# Patient Record
Sex: Female | Born: 1970 | Race: White | Hispanic: No | Marital: Single | Smoking: Current every day smoker
Health system: Southern US, Community
[De-identification: ages and names within clinical notes are randomized; demographics above are authoritative.]

## PROBLEM LIST (undated history)

## (undated) DIAGNOSIS — Z8719 Personal history of other diseases of the digestive system: Secondary | ICD-10-CM

## (undated) DIAGNOSIS — F1010 Alcohol abuse, uncomplicated: Secondary | ICD-10-CM

## (undated) DIAGNOSIS — I456 Pre-excitation syndrome: Secondary | ICD-10-CM

## (undated) DIAGNOSIS — I739 Peripheral vascular disease, unspecified: Secondary | ICD-10-CM

## (undated) DIAGNOSIS — K56609 Unspecified intestinal obstruction, unspecified as to partial versus complete obstruction: Secondary | ICD-10-CM

## (undated) DIAGNOSIS — F419 Anxiety disorder, unspecified: Secondary | ICD-10-CM

## (undated) DIAGNOSIS — M1990 Unspecified osteoarthritis, unspecified site: Secondary | ICD-10-CM

## (undated) DIAGNOSIS — K219 Gastro-esophageal reflux disease without esophagitis: Secondary | ICD-10-CM

## (undated) DIAGNOSIS — F329 Major depressive disorder, single episode, unspecified: Secondary | ICD-10-CM

## (undated) DIAGNOSIS — F32A Depression, unspecified: Secondary | ICD-10-CM

## (undated) DIAGNOSIS — F319 Bipolar disorder, unspecified: Secondary | ICD-10-CM

## (undated) DIAGNOSIS — R0600 Dyspnea, unspecified: Secondary | ICD-10-CM

## (undated) DIAGNOSIS — N189 Chronic kidney disease, unspecified: Secondary | ICD-10-CM

## (undated) HISTORY — DX: Peripheral vascular disease, unspecified: I73.9

## (undated) HISTORY — DX: Pre-excitation syndrome: I45.6

## (undated) HISTORY — DX: Major depressive disorder, single episode, unspecified: F32.9

## (undated) HISTORY — DX: Unspecified intestinal obstruction, unspecified as to partial versus complete obstruction: K56.609

## (undated) HISTORY — DX: Bipolar disorder, unspecified: F31.9

## (undated) HISTORY — DX: Anxiety disorder, unspecified: F41.9

## (undated) HISTORY — DX: Depression, unspecified: F32.A

## (undated) HISTORY — PX: TUBAL LIGATION: SHX77

## (undated) HISTORY — DX: Unspecified osteoarthritis, unspecified site: M19.90

## (undated) HISTORY — DX: Alcohol abuse, uncomplicated: F10.10

---

## 1997-09-16 ENCOUNTER — Other Ambulatory Visit: Admission: RE | Admit: 1997-09-16 | Discharge: 1997-09-16 | Payer: Self-pay | Admitting: Obstetrics

## 1997-11-26 ENCOUNTER — Encounter: Admission: RE | Admit: 1997-11-26 | Discharge: 1997-11-26 | Payer: Self-pay | Admitting: Obstetrics & Gynecology

## 1997-12-06 ENCOUNTER — Ambulatory Visit (HOSPITAL_COMMUNITY): Admission: RE | Admit: 1997-12-06 | Discharge: 1997-12-06 | Payer: Self-pay | Admitting: Obstetrics & Gynecology

## 1997-12-07 ENCOUNTER — Encounter: Admission: RE | Admit: 1997-12-07 | Discharge: 1997-12-07 | Payer: Self-pay | Admitting: Obstetrics & Gynecology

## 1998-01-12 ENCOUNTER — Ambulatory Visit (HOSPITAL_COMMUNITY): Admission: RE | Admit: 1998-01-12 | Discharge: 1998-01-12 | Payer: Self-pay | Admitting: *Deleted

## 1998-03-10 ENCOUNTER — Encounter: Admission: RE | Admit: 1998-03-10 | Discharge: 1998-06-08 | Payer: Self-pay | Admitting: Obstetrics

## 1998-03-22 ENCOUNTER — Ambulatory Visit (HOSPITAL_COMMUNITY): Admission: RE | Admit: 1998-03-22 | Discharge: 1998-03-22 | Payer: Self-pay | Admitting: *Deleted

## 1998-06-09 ENCOUNTER — Inpatient Hospital Stay (HOSPITAL_COMMUNITY): Admission: AD | Admit: 1998-06-09 | Discharge: 1998-06-09 | Payer: Self-pay | Admitting: Obstetrics & Gynecology

## 1998-07-06 ENCOUNTER — Encounter (HOSPITAL_COMMUNITY): Admission: RE | Admit: 1998-07-06 | Discharge: 1998-08-16 | Payer: Self-pay | Admitting: Obstetrics & Gynecology

## 1998-07-06 ENCOUNTER — Encounter: Admission: RE | Admit: 1998-07-06 | Discharge: 1998-07-06 | Payer: Self-pay | Admitting: Obstetrics & Gynecology

## 1998-07-13 ENCOUNTER — Encounter: Admission: RE | Admit: 1998-07-13 | Discharge: 1998-07-13 | Payer: Self-pay | Admitting: Obstetrics & Gynecology

## 1998-07-20 ENCOUNTER — Inpatient Hospital Stay (HOSPITAL_COMMUNITY): Admission: AD | Admit: 1998-07-20 | Discharge: 1998-07-20 | Payer: Self-pay | Admitting: *Deleted

## 1998-07-23 ENCOUNTER — Inpatient Hospital Stay (HOSPITAL_COMMUNITY): Admission: AD | Admit: 1998-07-23 | Discharge: 1998-07-23 | Payer: Self-pay | Admitting: Obstetrics & Gynecology

## 1998-07-30 ENCOUNTER — Inpatient Hospital Stay (HOSPITAL_COMMUNITY): Admission: AD | Admit: 1998-07-30 | Discharge: 1998-07-30 | Payer: Self-pay | Admitting: Obstetrics & Gynecology

## 1998-08-12 ENCOUNTER — Inpatient Hospital Stay (HOSPITAL_COMMUNITY): Admission: AD | Admit: 1998-08-12 | Discharge: 1998-08-12 | Payer: Self-pay | Admitting: *Deleted

## 1998-08-15 ENCOUNTER — Inpatient Hospital Stay (HOSPITAL_COMMUNITY): Admission: AD | Admit: 1998-08-15 | Discharge: 1998-08-17 | Payer: Self-pay | Admitting: Obstetrics & Gynecology

## 1999-06-07 ENCOUNTER — Encounter: Payer: Self-pay | Admitting: Emergency Medicine

## 1999-06-07 ENCOUNTER — Emergency Department (HOSPITAL_COMMUNITY): Admission: EM | Admit: 1999-06-07 | Discharge: 1999-06-07 | Payer: Self-pay | Admitting: Emergency Medicine

## 1999-09-06 ENCOUNTER — Emergency Department (HOSPITAL_COMMUNITY): Admission: EM | Admit: 1999-09-06 | Discharge: 1999-09-06 | Payer: Self-pay | Admitting: Emergency Medicine

## 1999-09-06 ENCOUNTER — Encounter: Payer: Self-pay | Admitting: Emergency Medicine

## 1999-09-27 ENCOUNTER — Encounter: Payer: Self-pay | Admitting: Cardiology

## 1999-09-27 ENCOUNTER — Ambulatory Visit (HOSPITAL_COMMUNITY): Admission: RE | Admit: 1999-09-27 | Discharge: 1999-09-27 | Payer: Self-pay | Admitting: Cardiology

## 1999-10-04 ENCOUNTER — Other Ambulatory Visit: Admission: RE | Admit: 1999-10-04 | Discharge: 1999-10-04 | Payer: Self-pay | Admitting: Family Medicine

## 1999-10-17 ENCOUNTER — Other Ambulatory Visit: Admission: RE | Admit: 1999-10-17 | Discharge: 1999-10-17 | Payer: Self-pay | Admitting: Obstetrics & Gynecology

## 1999-10-17 ENCOUNTER — Encounter: Admission: RE | Admit: 1999-10-17 | Discharge: 1999-10-17 | Payer: Self-pay | Admitting: Obstetrics & Gynecology

## 1999-10-31 ENCOUNTER — Encounter: Admission: RE | Admit: 1999-10-31 | Discharge: 1999-10-31 | Payer: Self-pay | Admitting: Obstetrics & Gynecology

## 2001-06-04 ENCOUNTER — Inpatient Hospital Stay (HOSPITAL_COMMUNITY): Admission: EM | Admit: 2001-06-04 | Discharge: 2001-06-06 | Payer: Self-pay | Admitting: Emergency Medicine

## 2001-06-04 ENCOUNTER — Encounter: Payer: Self-pay | Admitting: Emergency Medicine

## 2001-06-05 ENCOUNTER — Encounter: Payer: Self-pay | Admitting: Cardiology

## 2008-03-19 ENCOUNTER — Emergency Department (HOSPITAL_COMMUNITY): Admission: EM | Admit: 2008-03-19 | Discharge: 2008-03-19 | Payer: Self-pay | Admitting: Emergency Medicine

## 2009-01-01 ENCOUNTER — Emergency Department (HOSPITAL_COMMUNITY): Admission: EM | Admit: 2009-01-01 | Discharge: 2009-01-01 | Payer: Self-pay | Admitting: Emergency Medicine

## 2009-01-02 ENCOUNTER — Inpatient Hospital Stay (HOSPITAL_COMMUNITY): Admission: EM | Admit: 2009-01-02 | Discharge: 2009-01-04 | Payer: Self-pay | Admitting: Emergency Medicine

## 2009-01-04 ENCOUNTER — Encounter (INDEPENDENT_AMBULATORY_CARE_PROVIDER_SITE_OTHER): Payer: Self-pay | Admitting: Internal Medicine

## 2009-01-04 ENCOUNTER — Ambulatory Visit: Payer: Self-pay | Admitting: Vascular Surgery

## 2009-04-01 ENCOUNTER — Emergency Department (HOSPITAL_COMMUNITY): Admission: EM | Admit: 2009-04-01 | Discharge: 2009-04-01 | Payer: Self-pay | Admitting: Emergency Medicine

## 2010-01-29 ENCOUNTER — Emergency Department (HOSPITAL_COMMUNITY): Admission: EM | Admit: 2010-01-29 | Discharge: 2010-01-29 | Payer: Self-pay | Admitting: Family Medicine

## 2010-05-29 ENCOUNTER — Emergency Department (HOSPITAL_COMMUNITY)
Admission: EM | Admit: 2010-05-29 | Discharge: 2010-05-29 | Payer: Self-pay | Source: Home / Self Care | Admitting: Family Medicine

## 2010-05-29 ENCOUNTER — Emergency Department (HOSPITAL_COMMUNITY): Admission: EM | Admit: 2010-05-29 | Discharge: 2010-05-29 | Payer: Self-pay | Admitting: Emergency Medicine

## 2010-06-04 ENCOUNTER — Emergency Department (HOSPITAL_COMMUNITY): Admission: EM | Admit: 2010-06-04 | Discharge: 2010-06-04 | Payer: Self-pay | Admitting: Family Medicine

## 2010-07-30 DIAGNOSIS — K56609 Unspecified intestinal obstruction, unspecified as to partial versus complete obstruction: Secondary | ICD-10-CM

## 2010-07-30 HISTORY — DX: Unspecified intestinal obstruction, unspecified as to partial versus complete obstruction: K56.609

## 2010-10-11 LAB — POCT URINALYSIS DIPSTICK
Hgb urine dipstick: NEGATIVE
Nitrite: NEGATIVE
Protein, ur: NEGATIVE mg/dL
Specific Gravity, Urine: >=1.03 (ref 1.005–1.030)
Urobilinogen, UA: 0.2 mg/dL (ref 0.0–1.0)
pH: 5.5 (ref 5.0–8.0)

## 2010-10-15 LAB — POCT RAPID STREP A (OFFICE): Streptococcus, Group A Screen (Direct): NEGATIVE

## 2010-11-06 LAB — CULTURE, BLOOD (ROUTINE X 2)
Culture: NO GROWTH
Culture: NO GROWTH

## 2010-11-06 LAB — COMPREHENSIVE METABOLIC PANEL
ALT: 13 U/L (ref 0–35)
AST: 13 U/L (ref 0–37)
Albumin: 2.7 g/dL — ABNORMAL LOW (ref 3.5–5.2)
Alkaline Phosphatase: 70 U/L (ref 39–117)
Calcium: 8.3 mg/dL — ABNORMAL LOW (ref 8.4–10.5)
GFR calc Af Amer: >60 mL/min (ref >60)
Glucose, Bld: 108 mg/dL — ABNORMAL HIGH (ref 70–99)
Potassium: 3.8 mEq/L (ref 3.5–5.1)
Sodium: 138 mEq/L (ref 135–145)
Total Protein: 5.9 g/dL — ABNORMAL LOW (ref 6.0–8.3)

## 2010-11-06 LAB — CBC
HCT: 36.5 % (ref 36.0–46.0)
Hemoglobin: 10.9 g/dL — ABNORMAL LOW (ref 12.0–15.0)
MCV: 93.7 fL (ref 78.0–100.0)
Platelets: 137 10*3/uL — ABNORMAL LOW (ref 150–400)
Platelets: 161 10*3/uL (ref 150–400)
RBC: 3.37 MIL/uL — ABNORMAL LOW (ref 3.87–5.11)
RBC: 3.74 MIL/uL — ABNORMAL LOW (ref 3.87–5.11)
RBC: 3.97 MIL/uL (ref 3.87–5.11)
RDW: 13 % (ref 11.5–15.5)
WBC: 5.1 10*3/uL (ref 4.0–10.5)
WBC: 7.2 10*3/uL (ref 4.0–10.5)

## 2010-11-06 LAB — LIPID PANEL
HDL: 19 mg/dL — ABNORMAL LOW (ref >39)
Total CHOL/HDL Ratio: 7.7 RATIO
Triglycerides: 141 mg/dL (ref <150)

## 2010-11-06 LAB — POCT I-STAT, CHEM 8
BUN: 4 mg/dL — ABNORMAL LOW (ref 6–23)
Creatinine, Ser: 1 mg/dL (ref 0.4–1.2)
Glucose, Bld: 122 mg/dL — ABNORMAL HIGH (ref 70–99)
Potassium: 3.6 mEq/L (ref 3.5–5.1)
Sodium: 137 mEq/L (ref 135–145)
TCO2: 25 mmol/L (ref 0–100)

## 2010-11-06 LAB — CK TOTAL AND CKMB (NOT AT ARMC)
CK, MB: 0.8 ng/mL (ref 0.3–4.0)
Total CK: 32 U/L (ref 7–177)

## 2010-11-06 LAB — ANTI-NUCLEAR AB-TITER (ANA TITER): ANA Titer 1: NEGATIVE

## 2010-11-06 LAB — URINALYSIS, ROUTINE W REFLEX MICROSCOPIC
Bilirubin Urine: NEGATIVE
Glucose, UA: NEGATIVE mg/dL
Hgb urine dipstick: NEGATIVE
Ketones, ur: NEGATIVE mg/dL
Protein, ur: NEGATIVE mg/dL
Urobilinogen, UA: 0.2 mg/dL (ref 0.0–1.0)

## 2010-11-06 LAB — URINE CULTURE: Special Requests: NEGATIVE

## 2010-11-06 LAB — HCG, SERUM, QUALITATIVE: Preg, Serum: NEGATIVE

## 2010-11-06 LAB — TROPONIN I
Troponin I: 0.01 ng/mL (ref 0.00–0.06)
Troponin I: 0.01 ng/mL (ref 0.00–0.06)
Troponin I: 0.02 ng/mL (ref 0.00–0.06)

## 2010-11-06 LAB — RAPID URINE DRUG SCREEN, HOSP PERFORMED
Amphetamines: NOT DETECTED
Barbiturates: NOT DETECTED
Benzodiazepines: NOT DETECTED
Cocaine: POSITIVE — AB
Opiates: NOT DETECTED

## 2010-11-06 LAB — DIFFERENTIAL
Eosinophils Relative: 0 % (ref 0–5)
Lymphocytes Relative: 18 % (ref 12–46)
Lymphs Abs: 1.3 10*3/uL (ref 0.7–4.0)
Monocytes Relative: 5 % (ref 3–12)

## 2010-11-06 LAB — HEPATIC FUNCTION PANEL
Bilirubin, Direct: 0.2 mg/dL (ref 0.0–0.3)
Indirect Bilirubin: 0.6 mg/dL (ref 0.3–0.9)
Total Protein: 6.8 g/dL (ref 6.0–8.3)

## 2010-11-06 LAB — BASIC METABOLIC PANEL
BUN: 7 mg/dL (ref 6–23)
Creatinine, Ser: 0.93 mg/dL (ref 0.4–1.2)
GFR calc non Af Amer: >60 mL/min (ref >60)

## 2010-11-06 LAB — URIC ACID: Uric Acid, Serum: 4.5 mg/dL (ref 2.4–7.0)

## 2010-11-06 LAB — BRAIN NATRIURETIC PEPTIDE: Pro B Natriuretic peptide (BNP): 41 pg/mL (ref 0.0–100.0)

## 2010-11-06 LAB — ANA: Anti Nuclear Antibody(ANA): POSITIVE — AB

## 2010-11-06 LAB — GLUCOSE, CAPILLARY

## 2010-12-12 NOTE — Discharge Summary (Signed)
NAMEZAYAH, [name] NO.:  [id number]   MEDICAL RECORD NO.:  [id number]          PATIENT TYPE:  INP   LOCATION:  3703                         FACILITY:  [facility]   PHYSICIAN:  Isidor Holts, M.D.  DATE OF BIRTH:  1970-12-07   DATE OF ADMISSION:  01/02/2009  DATE OF DISCHARGE:  01/04/2009                               DISCHARGE SUMMARY   PRIMARY CARE PHYSICIAN:  Unassigned.   DISCHARGE DIAGNOSES:  1. Pruritic dermatosis, bilateral lower extremities.  2. Left big toe abrasion and nailbed wound.  3. History of bipolar disorder.  4. History of Wolff-Parkinson-White syndrome.  5. Polysubstance abuse.  6. Smoking history.  7. Chronic alcoholism.  8. History of osteoarthritis, knees and ankles.  9. History of hematochezia.   DISCHARGE MEDICATIONS:  1. Depakote 1500 mg p.o. q.h.s.  2. Lexapro 10 mg p.o. daily.  3. Thiamine 100 mg p.o. daily.  4. Prednisone 40 mg p.o. daily for three days, from January 05, 2009.  5. Doxycycline 100 mg p.o. b.i.d. for seven days only.  6. Ativan 1 mg p.o. t.i.d. for two days, then 1 mg p.o. b.i.d. for two      days, then 1 mg p.o. daily times one day, then stop.  7. Hydrocortisone cream 1% apply topically to both lower legs b.i.d.      for five days, then stop.  8. Combivent Inhaler, 2 puffs p.r.n. q.i.d.   PROCEDURES:  1. X-rays both feet and ankles January 03, 2009.  This showed soft tissue      swelling, no acute bony abnormalities.  2. Chest x-ray done January 03, 2009.  This showed vascular congestion, no      frank edema, negative for infiltrates or effusions, heart and      mediastinal contours were normal.   CONSULTATIONS:  None.   ADMISSION HISTORY:  Please refer to H and P notes of January 02, 2009,  dictated by Dr. Midge Minium.  However, in brief, this is a 40-year-  old female, with known history of bipolar disorder, Wolff-Parkinson-  White syndrome, smoking history, chronic alcohol abuse, and cocaine use,  presenting  with bilateral lower extremity swelling and erythema which  reportedly had been present for five days prior.  The patient associated  this with the fact that she was commenced on psychotropic medications,  i.e., Haloperidol and Benztropine approximately two weeks earlier.  She  discontinued these subsequently, and following this, the rash and lower  extremity edema occurred.  She felt that this has worsened, and  therefore presented to the emergency department.  In addition, the  patient states that she had been noticing blood in the stool on and off  for the past three months, not associated with abdominal pain or frank  melena.  Last episode was three days prior to this presentation.  She  had no associated weight loss or loss of appetite.  The patient was  admitted for further evaluation, investigation, and management.   CLINICAL COURSE:  1. Pruritic dermatosis, bilateral lower extremities.  Etiology is      unclear, however, the patient  did have a papular rash localized to      both lower extremities below the knee.  She was managed initially      with parenteral steroids.  Topical steroids were applied and the      patient was transitioned to oral steroids on January 04, 2009.      Clinical resolution was dramatic.  By January 04, 2009, there was no      evidence of either erythema or rash.   1. Left first toe wound.  The patient as mentioned above, presented      with an erythematous rash of bilateral lower extremities associated      with mild edema, raising the suspicion of cellulitis.  She was      initially commenced on parenteral Doxycycline.  On physical      examination, she was noted to have a partially torn nail of the      left big toe leaving a nailbed wound, as well as an abrasion on the      dorsum of the same toe.  According to the patient, she had stubbed      this toe some time in the past few days.  These wounds however,      appeared partially healed and given dramatic  resolution of      erythema, cellulitis appears not to be substantiated. However, it      was felt prudent to continue antibiotic treatment for a further      seven days.  She has been transitioned to oral Doxycycline and      although left toe wound showed no evidence of overt infection, she      has been recommended to utilize a dry dressing, i.e., Band-Aid.   1. Bipolar disorder.  The patient remained clinically stable from this      view point, during the course of her hospitalization.   1. History of Wolff-Parkinson-White syndrome.  There were no problems      referable to this.  No arrhythmias were documented.   1. Smoking history.  The patient smokes at least two packets of      cigarettes per day.  She was counseled appropriately, and placed on      a Nicotine CQ patch, during the course of this hospitalization.   1. Alcohol abuse.  The patient drinks approximately half pint of vodka      per day.  She was monitored closely for evidence of alcohol      withdrawal syndrome during the course of this hospitalization and      no withdrawal phenomena were noted.  She was, however, managed with      a tapering course of Ativan, and placed on Thiamine supplements.   1. COPD.  The patient did have a mild expiratory wheeze.  This was      managed with a Combivent inhaler.  Chest x-ray revealed no acute      findings.  As of January 04, 2009, the patient had no respiratory      symptoms whatsoever.   1. History of cocaine use.  This was confirmed by urine drug screen.      The patient has been counseled appropriately.   1. History of hematochezia.  None was noted during the course of this      hospitalization.  The patient's initial hemoglobin was 12.1 and on      January 04, 2009 was considered to be stable at 10.9.  It  is felt that      the patient might benefit from colonoscopy on an outpatient basis,      and she has been so advised.  She has no primary M.D. at the      present time,  however, arrangements have been put in place to refer      the patient to [facility].  We shall defer referral to      gastroenterologist to primary M.D, at [facility].   DISPOSITION:  The patient as of January 04, 2009, was considered clinically  stable for discharge, was asymptomatic, and had no evidence whatsoever  of rash or erythema of the lower extremities.  She was therefore,  discharged accordingly.   DIET:  No restrictions.   ACTIVITY:  No restrictions.   WOUND CARE:  Band-Aid to left big toe wound.   FOLLOW-UP INSTRUCTIONS:  The patient is to follow up with her regular  psychiatrist, Dr. Lavada Mesi per prior scheduled appointment.  She has  also been referred to [facility] for subsequent followup, for her  medical issues.  Appointment has been arranged by clinical social worker  and Futures trader.   SPECIAL INSTRUCTIONS:  Primary M.D. at [facility] is recommended to  arrange gastroenterology referral, to evaluate history of rectal  bleeding.      Isidor Holts, M.D.  Electronically Signed     CO/MEDQ  D:  01/04/2009  T:  01/04/2009  Job:  [id number]   cc:   Lavada Mesi, M.D.  [facility]

## 2010-12-12 NOTE — H&P (Signed)
NAMEJENNIEFER, [name] NO.:  [id number]   MEDICAL RECORD NO.:  [id number]          PATIENT TYPE:  INP   LOCATION:  3703                         FACILITY:  [facility]   PHYSICIAN:  Eduard Clos, MDDATE OF BIRTH:  04-17-1971   DATE OF ADMISSION:  01/02/2009  DATE OF DISCHARGE:                              HISTORY & PHYSICAL   CHIEF COMPLAINT:  Lower Extremity edema, swelling and itching.   HISTORY OF PRESENT ILLNESS:  A 40 year old female with known history of  bipolar disorder who was recently started 2 weeks ago on allopurinol and  benztropine along with medication.  The protime Lexapro started  developing some increasing rash and swelling of her lower extremities  bilaterally for the last five days.  The patient has been recently to  the beach last week.  The patient has stopped taking her benztropine and  Haloperidol for the last five days.  Itching and erythema started in the  lower extremity.  Presently, she feels that the swelling has increased  and is developing over her knees, and she was also complaining of knee  pain and her ankles and upper and right lower extremity is more swollen.  The patient also is developing some shortness of breath, but not in  acute distress, particularly she is short of breath and she exerts.  The  patient has been admitted for further evaluation.  The patient denies  any fever or chills, nausea, vomiting, abdominal pain.  Denies any  dysuria, discharge, the patient has been noticing bloody stools off and  on.  The last one she noticed was three days ago.  She had been having  these symptoms for last 3 months.   PAST MEDICAL HISTORY:  1. WPW syndrome.  2. Bipolar disorder.   PAST SURGICAL HISTORY:  None.   MEDICATIONS ON ADMISSION:  The patient presently is on:  1. Lexapro 10 mg daily.  2. Lipitor 5000 mg p.o. at bedtime.   ALLERGIES:  No known drug abuse.   FAMILY HISTORY:  Significant for coronary artery disease.   Dad died at  age 59 from MI.   ALLERGIES:  NO KNOWN DRUG ALLERGIES.   SOCIAL HISTORY:  The patient smokes cigarettes, drink alcohol daily.  Denies any drug abuse.  The patient has been advised to quit smoking and  drinking alcohol.   REVIEW OF SYSTEMS:  As per history of present illness.  Along with it,  the patient as explained has been coming off some GI bleed.  Denies any  headache, loss of consciousness or any focal deficits.   PHYSICAL EXAMINATION:  GENERAL:  The patient examined at bedside not in  acute distress.  VITAL SIGNS:  Blood pressure is 96/64, pulse 80, temperature 98.1,  respirations 18, O2 sat 96% on room air.  HEENT:  There is no facial asymmetry.  PERL positive.  Tongue is  midline.  No swelling or any facial erythema.  No neck rigidity.  CHEST:  Bilateral air entry present.  No crepitation.  HEART:  S1, S2 heard.  ABDOMEN:  Soft, nontender.  Bowel sounds heard.  CNS: Alert and oriented to time, place and person.  Moves upper and  lower extremities.  There is macular papular rash extending up to the  thigh with positive swelling, 1+ edema in the right ankle and minimal  edema in the left ankle, nonpitting.  Pulses felt.   LABORATORY DATA:  CBC:  WBC 7.2, hemoglobin 12.7, hematocrit 36.5,  platelets 161.   ASSESSMENT:  1. Erythema with itching and lower extremity edema.  Will possibly be      secondary to allergic reaction with cellulitis.  2. Arthritis of both ankles and knees.  3. Chronic GI bleed, episodic, presently stable.  4. History of bipolar disorder.  No suicidal ideation.  5. Obesity.  6. History of WPW syndrome.  7. Exertional shortness of breath, could be from deconditioning with      COPD, bilateral THF.   PLAN:  1. Will admit the patient to telemetry.  2. Will get blood cultures, urine cultures.  Will also send antibody      tests for [facility] Spotted Fever.  3. Will place the patient on doxycycline 100 mg IV q. 12.  4. Will get  X-ray of both feet, ankle to rule out fracture.  Get ANA      and sed rate.  Get urine drug screen and urine pregnancy screen.  5. Will place the patient on alcohol withdrawal protocol with thiamine      and folic acid.  6. Will cycle cardiac enzymes and continue present medication and get      a 2-D echo and further recommendation as condition evolves.      Eduard Clos, MD  Electronically Signed     ANK/MEDQ  D:  01/02/2009  T:  01/03/2009  Job:  [id number]

## 2010-12-15 NOTE — Discharge Summary (Signed)
[place]  Patient:    SHANTERICA, BIEHLER Visit Number: [id number] MRN: [id number]          Service Type: MED Location: 2000 2034 01 Attending Physician:  Robynn Pane Dictated by:   Eduardo Osier Sharyn Lull, M.D. Admit Date:  06/04/2001 Discharge Date: 06/06/2001                             Discharge Summary  ADMISSION DIAGNOSES: 1. Chest pain. 2. Cocaine abuse. 3. Rule out myocardial infarction. 4. Status post supraventricular tachycardia, WBW syndrome. 5. Tobacco abuse. 6. Alcohol abuse.  DISCHARGE MEDICATIONS: 1. Tiazac 120 mg p.o. q.d. 2. Enteric-coated aspirin 325 mg p.o. q.d.  The patient has been advised to stop abusing cocaine and tobacco.  ACTIVITY:  As tolerated.  DIET:  Low salt, low cholesterol.  CONDITION ON DISCHARGE:  Stable.  FOLLOW-UP:  With me on Monday.  The patient did not wait, but told the nurse that she will call my office and make appointment on Monday.  The patient left against medical advice at 3:15 p.m.  BRIEF HISTORY:  Ms. Kla Bily is a 40 year old white female with past medical history significant for WBW syndrome, morbid obesity, tobacco abuse, alcohol abuse, and history of cocaine abuse.  She came to the ER via EMS complaining of retrosternal chest tightness radiating to the neck and left arm associated with palpitations, dizziness, and nausea which woke her up from sleep.  Denied syncopal episode.  States palpitations and chest pain lasted for approximately one hour and called EMS.  She received 6 mg of IV Adenosine with conversion to sinus rhythm from SVT.  States in the last one year, had approximately four episodes, each lasting longer time.  No history of syncope. No family history of sudden cardiac death.  Presently, denies any chest pain, no shortness of breath.  Denies PND, orthopnea, leg swelling.  No history of exertional angina, but complains of exertional dyspnea.  PAST MEDICAL HISTORY:  As  above.  PAST SURGICAL HISTORY:  She had tubal ligation many years ago.  SOCIAL HISTORY:  She is married and has four children.  She smokes one pack per day for 20 years and drinks one pint of vodka per week and used to smoke crack off and on since 1980s and has been snorting cocaine recently.  FAMILY HISTORY:  Father had coronary artery bypass grafting in his 36s.  He had multiple PTCA and stents.  Mother is in good health.  She has one brother and sister in good health.  MEDICATIONS:  None.  ALLERGIES:  CODEINE.  PHYSICAL EXAMINATION:  GENERAL:  She was alert and oriented x 3 in no acute distress.  VITAL SIGNS:  Blood pressure 115/62, pulse 94.  HEENT: Conjunctivae pink.  NECK:  Supple with no JVD and no bruit.  LUNGS: Clear to auscultation without rhonchi or rales.  HEART: S1 and S2 was normal.  There was soft systolic murmur.  There was no click.  ABDOMEN: Soft.  Bowel sounds were present and nontender.  EXTREMITIES: There was no clubbing, cyanosis, or edema.  EKG showed normal sinus rhythm with WBW syndrome, pseudoinfarct pattern.  Her two-dimensional echocardiogram done on November 8, showed overall systolic function was normal.  There was no regional wall motion abnormalities.  There was no evidence of mitral valve prolapse.  Left atrium was mildly enlarged. Urine drug screen was positive for cocaine.  TSH was normal.  Cholesterol was 140, LDL 80, HDL 43.  Two sets of CPK-MBs and troponin I were negative. Hemoglobin 13.9, hematocrit 40.2, white count 13.0 with no shift to the left. Potassium was 3.4, glucose 110, BUN 9, creatinine 0.9.  BRIEF HISTORY:  The patient was admitted to telemetry unit and was started on nitrates and calcium channel blockers.  MI was ruled out by serial enzymes and EKG.  The patient did not have any episodes of further SVT during hospital stay. The patient underwent stress Cardiolite exercised for 23 minutes on Bruce protocol.  Peak heart rate  achieved was 129, peak blood pressure was 140/84.  There were no acute ischemic changes noted on the EKG, although, baseline EKG showed normal sinus rhythm with WBW syndrome with pseudoinfarct pattern.  Cardiolite scan showed no reversible perfusion defects with normal ejection fraction of 66%.  Discussed at length with the patient about medication versus radiofrequency ablation for WBW syndrome.  The patient signed out against medical advice and wanted to be treated medically at this point. Dictated by:   Eduardo Osier Sharyn Lull, M.D. Attending Physician:  Robynn Pane DD:  07/10/01 TD:  07/10/01 Job: [id number]

## 2011-03-29 ENCOUNTER — Emergency Department (HOSPITAL_COMMUNITY)
Admission: EM | Admit: 2011-03-29 | Discharge: 2011-03-29 | Disposition: A | Discharge status: Home / Self Care | Payer: Self-pay | Attending: Emergency Medicine | Admitting: Emergency Medicine

## 2011-03-29 DIAGNOSIS — R112 Nausea with vomiting, unspecified: Secondary | ICD-10-CM | POA: Insufficient documentation

## 2011-03-29 DIAGNOSIS — Z79899 Other long term (current) drug therapy: Secondary | ICD-10-CM | POA: Insufficient documentation

## 2011-03-29 DIAGNOSIS — R109 Unspecified abdominal pain: Secondary | ICD-10-CM | POA: Insufficient documentation

## 2011-03-29 DIAGNOSIS — F3130 Bipolar disorder, current episode depressed, mild or moderate severity, unspecified: Secondary | ICD-10-CM | POA: Insufficient documentation

## 2011-03-29 LAB — URINALYSIS, ROUTINE W REFLEX MICROSCOPIC
Bilirubin Urine: NEGATIVE
Glucose, UA: NEGATIVE mg/dL
Ketones, ur: NEGATIVE mg/dL
Protein, ur: NEGATIVE mg/dL
pH: 7 (ref 5.0–8.0)

## 2011-03-29 LAB — COMPREHENSIVE METABOLIC PANEL
ALT: 11 U/L (ref 0–35)
Alkaline Phosphatase: 75 U/L (ref 39–117)
BUN: 11 mg/dL (ref 6–23)
CO2: 28 mEq/L (ref 19–32)
Calcium: 8.9 mg/dL (ref 8.4–10.5)
GFR calc Af Amer: >60 mL/min (ref >60)
GFR calc non Af Amer: >60 mL/min (ref >60)
Glucose, Bld: 123 mg/dL — ABNORMAL HIGH (ref 70–99)
Sodium: 138 mEq/L (ref 135–145)
Total Protein: 6.9 g/dL (ref 6.0–8.3)

## 2011-03-29 LAB — LIPASE, BLOOD: Lipase: 32 U/L (ref 11–59)

## 2011-03-29 LAB — DIFFERENTIAL
Basophils Relative: 0 % (ref 0–1)
Lymphocytes Relative: 31 % (ref 12–46)
Lymphs Abs: 2.2 10*3/uL (ref 0.7–4.0)
Monocytes Absolute: 0.3 10*3/uL (ref 0.1–1.0)
Monocytes Relative: 4 % (ref 3–12)
Neutro Abs: 4.6 10*3/uL (ref 1.7–7.7)
Neutrophils Relative %: 64 % (ref 43–77)

## 2011-03-29 LAB — CBC
HCT: 36 % (ref 36.0–46.0)
Hemoglobin: 12.7 g/dL (ref 12.0–15.0)
MCH: 31.3 pg (ref 26.0–34.0)
MCHC: 35.3 g/dL (ref 30.0–36.0)
RBC: 4.06 MIL/uL (ref 3.87–5.11)

## 2011-03-30 LAB — URINE CULTURE

## 2011-04-06 ENCOUNTER — Emergency Department (HOSPITAL_COMMUNITY): Payer: Self-pay

## 2011-04-06 ENCOUNTER — Emergency Department (HOSPITAL_COMMUNITY)
Admission: EM | Admit: 2011-04-06 | Discharge: 2011-04-06 | Disposition: A | Discharge status: Home / Self Care | Payer: Self-pay | Attending: Emergency Medicine | Admitting: Emergency Medicine

## 2011-04-06 DIAGNOSIS — R197 Diarrhea, unspecified: Secondary | ICD-10-CM | POA: Insufficient documentation

## 2011-04-06 DIAGNOSIS — R110 Nausea: Secondary | ICD-10-CM | POA: Insufficient documentation

## 2011-04-06 DIAGNOSIS — Z79899 Other long term (current) drug therapy: Secondary | ICD-10-CM | POA: Insufficient documentation

## 2011-04-06 DIAGNOSIS — R109 Unspecified abdominal pain: Secondary | ICD-10-CM | POA: Insufficient documentation

## 2011-04-06 DIAGNOSIS — F3130 Bipolar disorder, current episode depressed, mild or moderate severity, unspecified: Secondary | ICD-10-CM | POA: Insufficient documentation

## 2011-04-06 DIAGNOSIS — R10812 Left upper quadrant abdominal tenderness: Secondary | ICD-10-CM | POA: Insufficient documentation

## 2011-04-06 LAB — URINALYSIS, ROUTINE W REFLEX MICROSCOPIC
Glucose, UA: NEGATIVE mg/dL
Ketones, ur: 15 mg/dL — AB
Leukocytes, UA: NEGATIVE
Nitrite: NEGATIVE
Protein, ur: NEGATIVE mg/dL

## 2011-04-06 LAB — COMPREHENSIVE METABOLIC PANEL
Albumin: 3.2 g/dL — ABNORMAL LOW (ref 3.5–5.2)
Alkaline Phosphatase: 98 U/L (ref 39–117)
BUN: 7 mg/dL (ref 6–23)
CO2: 26 mEq/L (ref 19–32)
Chloride: 102 mEq/L (ref 96–112)
GFR calc Af Amer: >60 mL/min (ref >60)
GFR calc non Af Amer: >60 mL/min (ref >60)
Glucose, Bld: 110 mg/dL — ABNORMAL HIGH (ref 70–99)
Potassium: 3.7 mEq/L (ref 3.5–5.1)
Total Bilirubin: 0.4 mg/dL (ref 0.3–1.2)

## 2011-04-06 LAB — DIFFERENTIAL
Basophils Absolute: 0 10*3/uL (ref 0.0–0.1)
Lymphocytes Relative: 27 % (ref 12–46)
Lymphs Abs: 2.2 10*3/uL (ref 0.7–4.0)
Neutro Abs: 5.3 10*3/uL (ref 1.7–7.7)

## 2011-04-06 LAB — CBC
HCT: 36.4 % (ref 36.0–46.0)
Hemoglobin: 12.6 g/dL (ref 12.0–15.0)
MCV: 88.8 fL (ref 78.0–100.0)
WBC: 8 10*3/uL (ref 4.0–10.5)

## 2011-04-06 LAB — LIPASE, BLOOD: Lipase: 42 U/L (ref 11–59)

## 2011-04-09 ENCOUNTER — Encounter: Payer: Self-pay | Admitting: Gastroenterology

## 2011-04-09 ENCOUNTER — Ambulatory Visit (INDEPENDENT_AMBULATORY_CARE_PROVIDER_SITE_OTHER): Payer: Self-pay | Admitting: Gastroenterology

## 2011-04-09 DIAGNOSIS — F1010 Alcohol abuse, uncomplicated: Secondary | ICD-10-CM

## 2011-04-09 DIAGNOSIS — R109 Unspecified abdominal pain: Secondary | ICD-10-CM

## 2011-04-09 DIAGNOSIS — F17200 Nicotine dependence, unspecified, uncomplicated: Secondary | ICD-10-CM

## 2011-04-09 MED ORDER — OXYCODONE-ACETAMINOPHEN 5-325 MG PO TABS: 1.0000 | ORAL_TABLET | ORAL | Status: DC | PRN

## 2011-04-09 NOTE — Patient Instructions (Addendum)
One of your biggest health concerns is your smoking.  This increases your risk for most cancers and serious cardiovascular diseases such as strokes, heart attacks.  You should try your best to stop.  If you need assistance, please contact your PCP or Smoking Cessation Class at [facility] [phone]) or [facility] Quit-Line (1-800-QUIT-NOW). You will be set up for a CT scan of abdomen and pelvis with IV and oral contrast. New prescription for percocet written 30 pills, no refills.     You have been scheduled for a CT scan of the abdomen and pelvis at [facility] ([address]---this is in the same building as Architectural technologist).   You are scheduled on 04/11/11 at 130 pm. You should arrive 15 minutes prior to your appointment time for registration. Please follow the written instructions below on the day of your exam:  WARNING: IF YOU ARE ALLERGIC TO IODINE/X-RAY DYE, PLEASE NOTIFY RADIOLOGY IMMEDIATELY AT [phone]! YOU WILL BE GIVEN A 13 HOUR PREMEDICATION PREP.  1) Do not eat or drink anything after 930 am (4 hours prior to your test) 2) You have been given 2 bottles of oral contrast to drink. The solution may taste better if refrigerated, but do NOT add ice or any other liquid to this solution. Shake well before drinking.    Drink 1 bottle of contrast @ 1130 am (2 hours prior to your exam)  Drink 1 bottle of contrast @ 1230 pm  (1 hour prior to your exam)  You may take any medications as prescribed with a small amount of water except for the following: Metformin, Glucophage, Glucovance, Avandamet, Riomet, Fortamet, Actoplus Met, Janumet, Glumetza or Metaglip. The above medications must be held the day of the exam AND 48 hours after the exam.  The purpose of you drinking the oral contrast is to aid in the visualization of your intestinal tract. The contrast solution may cause some diarrhea. Before your exam is started, you will be given a small amount of fluid to drink.  Depending on your individual set of symptoms, you may also receive an intravenous injection of x-ray contrast/dye. Plan on being at [facility] for 30 minutes or long, depending on the type of exam you are having performed.  If you have any questions regarding your exam or if you need to reschedule, you may call the CT department at [phone] between the hours of 8:00 am and 5:00 pm, Monday-Friday.  ________________________________________________________________________

## 2011-04-09 NOTE — Progress Notes (Signed)
HPI: This is a   pleasant 40 year old woman  Has had left sided abd pain, swelling.  Also hurts on right side.  Occurs intermittently, first noticed 6-7 months ago.  +nausea with the pain.  No vomiting blood. The pain will last 10 minutes, squeezing pain (colicy, spasms almost).  Became dizzy.  Has seen blood in her stool as well.  More recently dark green stools.    Takes 4 advil at a time, about twice a week (for headaches).    Blood work in the emergency room showed normal CBC, normal complete metabolic profile normal lipase.    abdominal plain films in the emergency room were normal.   Has been taking prilosec since ER.  Her chronic pyrosis has improved but pain still present.    Bowels have been normal.  Drinks 1/2 gallon vodka every weekend.    Overall weight is up 15 pounds in past year.  No abdominal surgeries.      Review of systems: Pertinent positive and negative review of systems were noted in the above HPI section.  All other review of systems was otherwise negative.   Past Medical History  Diagnosis Date  . Alcohol abuse   . Anxiety   . Wolf-Parkinson-White syndrome   . Arthritis   . Depression   . Bipolar disorder     History reviewed. No pertinent past surgical history.   reports that she has been smoking Cigarettes.  She has been smoking about 1 pack per day. She does not have any smokeless tobacco history on file. She reports that she drinks alcohol. She reports that she does not use illicit drugs.  family history includes Breast cancer in her maternal aunt; Colon polyps in her maternal grandmother; Diabetes in her maternal grandmother; Heart attack in her maternal grandmother; and Heart disease in her father, paternal grandfather, and paternal grandmother.  There is no history of Colon cancer.    Current Medications, Allergies were all reviewed with the patient via [facility] electronic medical record system.    Physical Exam: BP 108/62   Pulse 74  Ht 5\' 6"  (1.676 m)  Wt 238 lb 3.2 oz (108.047 kg)  BMI 38.45 kg/m2  LMP 04/02/2011 Constitutional: generally well-appearing Psychiatric: alert and oriented x3 Eyes: extraocular movements intact Mouth: oral pharynx moist, no lesions Neck: supple no lymphadenopathy Cardiovascular: heart regular rate and rhythm Lungs: clear to auscultation bilaterally Abdomen: soft, mildly tender in the left side, nondistended, no obvious ascites, no peritoneal signs, normal bowel sounds Extremities: no lower extremity edema bilaterally Skin: no lesions on visible extremities    Assessment and plan: 40 y.o. female with left-sided abdominal pains, intermittent  She was a bit tender today on examination. Not sure she has mild diverticulitis, perhaps peptic ulcer disease she does take several Advil about twice a week. Labs in the past couple days were all normal and abdominal plain film was normal. On a starter workup with a CT scan abdomen and pelvis and I will take it from there. I gave her a prescription for 30 Percocet, no refills.

## 2011-04-11 ENCOUNTER — Ambulatory Visit (INDEPENDENT_AMBULATORY_CARE_PROVIDER_SITE_OTHER)
Admission: RE | Admit: 2011-04-11 | Discharge: 2011-04-11 | Disposition: A | Discharge status: Home / Self Care | Payer: Self-pay | Source: Ambulatory Visit | Attending: Gastroenterology | Admitting: Gastroenterology

## 2011-04-11 ENCOUNTER — Telehealth: Payer: Self-pay | Admitting: Gastroenterology

## 2011-04-11 DIAGNOSIS — F17200 Nicotine dependence, unspecified, uncomplicated: Secondary | ICD-10-CM

## 2011-04-11 DIAGNOSIS — F1010 Alcohol abuse, uncomplicated: Secondary | ICD-10-CM

## 2011-04-11 MED ORDER — IOHEXOL 300 MG/ML  SOLN
80.0000 mL | Freq: Once | INTRAMUSCULAR | Status: AC | PRN
  Administered 2011-04-11: 80 mL via INTRAVENOUS

## 2011-04-11 NOTE — Telephone Encounter (Signed)
Pt mother called because the pt has been in severe pain since 4 am she is having CT scan now I advised the pt that I would let Dr Christella Hartigan know and we would call as soon as the results have been read.  She also states that the contrast caused a "burning" in the stomach and more pain.

## 2011-04-12 ENCOUNTER — Telehealth: Payer: Self-pay

## 2011-04-12 NOTE — Telephone Encounter (Signed)
Verbal order from Dr Christella Hartigan to send pt to the ER pt aware she will go to the er

## 2011-04-12 NOTE — Telephone Encounter (Signed)
Pt called to report on symptoms, she used the 2 fleet enemas and went to the bathroom but still continues to have severe pain.  She has a "burning" in her stomach.  Please advise.

## 2011-04-17 ENCOUNTER — Telehealth: Payer: Self-pay | Admitting: Gastroenterology

## 2011-04-18 NOTE — Telephone Encounter (Signed)
appt made with Willette Cluster for 04/19/11 930 am xray order in epic and pt will have done 1-2 hours before appt.

## 2011-04-18 NOTE — Telephone Encounter (Signed)
Pt continues to have lower abd pain and lower back pain.  She says she takes Advil with no relief.  Pt has had BRB on tissue x 2.  Pt is having regular bowel movements.  Pt has no fever.  She does have nausea.  Pt requesting pain meds.  Please advise

## 2011-04-18 NOTE — Telephone Encounter (Signed)
She needs plain abd films (obstructive series) and ROV  Today or tomorrow with externder or MD

## 2011-04-19 ENCOUNTER — Encounter: Payer: Self-pay | Admitting: Nurse Practitioner

## 2011-04-19 ENCOUNTER — Ambulatory Visit (INDEPENDENT_AMBULATORY_CARE_PROVIDER_SITE_OTHER)
Admission: RE | Admit: 2011-04-19 | Discharge: 2011-04-19 | Disposition: A | Discharge status: Home / Self Care | Payer: Self-pay | Source: Ambulatory Visit | Attending: Gastroenterology | Admitting: Gastroenterology

## 2011-04-19 ENCOUNTER — Ambulatory Visit (INDEPENDENT_AMBULATORY_CARE_PROVIDER_SITE_OTHER): Payer: Self-pay | Admitting: Nurse Practitioner

## 2011-04-19 VITALS — BP 114/70 | HR 68 | Ht 66.0 in | Wt 232.0 lb

## 2011-04-19 DIAGNOSIS — R109 Unspecified abdominal pain: Secondary | ICD-10-CM

## 2011-04-19 DIAGNOSIS — K648 Other hemorrhoids: Secondary | ICD-10-CM

## 2011-04-19 DIAGNOSIS — R1012 Left upper quadrant pain: Secondary | ICD-10-CM | POA: Insufficient documentation

## 2011-04-19 MED ORDER — OXYCODONE-ACETAMINOPHEN 5-325 MG PO TABS: 1.0000 | ORAL_TABLET | ORAL | Status: AC | PRN

## 2011-04-19 MED ORDER — HYDROCORTISONE ACETATE 25 MG RE SUPP: RECTAL | Status: DC

## 2011-04-19 NOTE — Patient Instructions (Signed)
We have refilled the Percocet prescription and faxed it to your pharmacy. We sent a prescription for suppositories to your pharmacy [address].  We have given you samples of Prilosec. Take 1 30 min before breakfast.  We will call you with an appointment to have an Endoscopy done with Dr. Christella Hartigan at [facility]. His schedule is Full at this time but I will continually check the schedule.

## 2011-04-19 NOTE — Progress Notes (Signed)
[name] [id number] [birth year]   HISTORY OR PRESENT ILLNESS : Patient is a 40 year old white female since approximately two weeks ago by Dr. Christella Hartigan for a several month history of left and right sided abdominal pain and nausea. Episodes last 10 minutes and described as squeezing pain. She was seen in ER and labs showed normal CBC, normal complete metabolic profile, normal lipase. Abdominal plain films were normal. Patient was tender at time of her visit with Dr. Christella Hartigan, he ordered a CTscan of the abdomen which showed slightly dilated small bowel with air/fluid levels and a lot of stool throughout colon. Patient never felt constipated but regardless, she did have BMs after enemas. KUB today was negative. Yesterday had bright red blood from rectum with bowel movement and then some in underpants later in the afternoon. Still has LUQ pain, "squeezing" pain. Nauseated without vomiting. Still taking Advil 3-4 times a day for joint pain. For the last year she has been getting ulcers in her mouth.  Current Medications, Allergies, Past Medical History, Past Surgical History, Family History and Social History were reviewed in [employer] record.  PHYSICAL EXAMINATION : General: Well developed obese, white female in no acute distress Head: Normocephalic and atraumatic Eyes:  sclerae anicteric,conjunctive pink. Ears: Normal auditory acuity Mouth: No deformity or lesions Neck: Supple, no masses.  Lungs: Clear throughout to auscultation Heart: Regular rate and rhythm; no murmurs heard Abdomen: Soft, nondistended, nontender. No masses or hepatomegaly noted. Normal bowel sounds Rectal: Inflamed, internal hemorrhoids on anoscopy. Heme negative brown stool. Musculoskeletal: Symmetrical with no gross deformities  Skin: No lesions on visible extremities Extremities: No edema or deformities noted Neurological: Alert oriented x 4, grossly nonfocal Cervical Nodes:  No significant cervical  adenopathy Psychological:  Alert and cooperative. Normal mood and affect  ASSESSMENT AND PLAN :

## 2011-04-23 DIAGNOSIS — K648 Other hemorrhoids: Secondary | ICD-10-CM | POA: Insufficient documentation

## 2011-04-23 NOTE — Assessment & Plan Note (Addendum)
Persistent LUQ pain and nausea in setting of NSAID use. She is quite tender in LUQ on exam. Labs and CTscan unrevealing. Rule out PUD. Start daily PPI. For further evaluation will proceed with upper endoscopy with Propofol. The benefits, risks, and potential complications of EGD with possible biopsies were discussed with the patient and she agrees to proceed. Advised to hold NSAIDS until PUD excluded.

## 2011-04-24 NOTE — Progress Notes (Signed)
I agree with assessment and plans 

## 2011-04-24 NOTE — Assessment & Plan Note (Signed)
Occasional bright red blood when wiping after bowel movement. Bleeding likely secondary to internal hemorrhoids seen on anoscopy today. Trial of steroid suppositories.

## 2011-04-25 ENCOUNTER — Telehealth: Payer: Self-pay | Admitting: Nurse Practitioner

## 2011-04-25 NOTE — Telephone Encounter (Signed)
Spoke to pt and let her know Dr. Christella Hartigan next available date for EGD w/ PROPOFOL at [facility] is 06-07-2011.  She was okay with that and I scheduled it and also scheduled the previsit on 05-28-2011 at 11Am.

## 2011-04-27 ENCOUNTER — Telehealth: Payer: Self-pay | Admitting: *Deleted

## 2011-04-27 NOTE — Telephone Encounter (Signed)
Made the pt previsit appt for the EGD w/ PROPOFOL on 05/28/2011 at 11 AM. Made the EGD PROPOFOL on 06-07-2011 at [facility] at 10:00 Am.   The patient was satisfied with the appointments.

## 2011-04-27 NOTE — Telephone Encounter (Signed)
Message copied by Derry Skill on Fri Apr 27, 2011 10:16 AM ------      Message from: [name], [place] K      Created: Thu Apr 19, 2011 10:20 AM       Schedule the pt for EGD w/ Propofol  With Dr Christella Hartigan at [facility], first available Thurs.

## 2011-05-24 ENCOUNTER — Emergency Department (HOSPITAL_COMMUNITY)
Admission: EM | Admit: 2011-05-24 | Discharge: 2011-05-24 | Disposition: A | Discharge status: Home / Self Care | Payer: Medicaid Other | Attending: Emergency Medicine | Admitting: Emergency Medicine

## 2011-05-24 DIAGNOSIS — M545 Low back pain, unspecified: Secondary | ICD-10-CM | POA: Insufficient documentation

## 2011-05-24 DIAGNOSIS — R51 Headache: Secondary | ICD-10-CM | POA: Insufficient documentation

## 2011-05-24 DIAGNOSIS — Y92009 Unspecified place in unspecified non-institutional (private) residence as the place of occurrence of the external cause: Secondary | ICD-10-CM | POA: Insufficient documentation

## 2011-05-24 DIAGNOSIS — T148XXA Other injury of unspecified body region, initial encounter: Secondary | ICD-10-CM | POA: Insufficient documentation

## 2011-05-24 DIAGNOSIS — X500XXA Overexertion from strenuous movement or load, initial encounter: Secondary | ICD-10-CM | POA: Insufficient documentation

## 2011-05-24 DIAGNOSIS — M546 Pain in thoracic spine: Secondary | ICD-10-CM | POA: Insufficient documentation

## 2011-05-29 ENCOUNTER — Telehealth: Payer: Self-pay | Admitting: *Deleted

## 2011-05-29 NOTE — Telephone Encounter (Signed)
The patient was scheduled for a Previsit today 05-28-2011. She did not come to the previsit appointment.  The previsit nurse Marylene Land printed the letter to the patient and I put it in an envelope and we mailed it on 05-28-2011.  The letter stated she needed to call us about rescheduling the procedure and previsit appointment.  I called the pt today 05-29-2011 at [phone].  I got no answer and could not leave a message.

## 2011-06-04 ENCOUNTER — Encounter (HOSPITAL_COMMUNITY): Payer: Self-pay | Admitting: *Deleted

## 2011-06-07 ENCOUNTER — Encounter: Payer: Self-pay | Admitting: Gastroenterology

## 2011-06-07 ENCOUNTER — Encounter (HOSPITAL_COMMUNITY): Payer: Self-pay

## 2011-06-07 ENCOUNTER — Encounter (HOSPITAL_COMMUNITY): Payer: Self-pay | Admitting: Registered Nurse

## 2011-06-07 ENCOUNTER — Telehealth: Payer: Self-pay | Admitting: Gastroenterology

## 2011-06-07 ENCOUNTER — Ambulatory Visit (HOSPITAL_COMMUNITY)
Admission: RE | Admit: 2011-06-07 | Discharge: 2011-06-07 | Disposition: A | Discharge status: Home / Self Care | Payer: Medicaid Other | Source: Ambulatory Visit | Attending: Gastroenterology | Admitting: Gastroenterology

## 2011-06-07 ENCOUNTER — Ambulatory Visit (HOSPITAL_COMMUNITY): Payer: Medicaid Other | Admitting: Registered Nurse

## 2011-06-07 ENCOUNTER — Encounter (HOSPITAL_COMMUNITY)
Admission: RE | Disposition: A | Discharge status: Home / Self Care | Payer: Self-pay | Source: Ambulatory Visit | Attending: Gastroenterology

## 2011-06-07 ENCOUNTER — Other Ambulatory Visit: Payer: Self-pay | Admitting: Gastroenterology

## 2011-06-07 DIAGNOSIS — Z791 Long term (current) use of non-steroidal anti-inflammatories (NSAID): Secondary | ICD-10-CM | POA: Insufficient documentation

## 2011-06-07 DIAGNOSIS — R0902 Hypoxemia: Secondary | ICD-10-CM | POA: Insufficient documentation

## 2011-06-07 DIAGNOSIS — R1012 Left upper quadrant pain: Secondary | ICD-10-CM

## 2011-06-07 DIAGNOSIS — K2990 Gastroduodenitis, unspecified, without bleeding: Secondary | ICD-10-CM

## 2011-06-07 DIAGNOSIS — K449 Diaphragmatic hernia without obstruction or gangrene: Secondary | ICD-10-CM

## 2011-06-07 DIAGNOSIS — K2970 Gastritis, unspecified, without bleeding: Secondary | ICD-10-CM

## 2011-06-07 DIAGNOSIS — K3184 Gastroparesis: Secondary | ICD-10-CM | POA: Insufficient documentation

## 2011-06-07 DIAGNOSIS — F1010 Alcohol abuse, uncomplicated: Secondary | ICD-10-CM | POA: Insufficient documentation

## 2011-06-07 DIAGNOSIS — K219 Gastro-esophageal reflux disease without esophagitis: Secondary | ICD-10-CM | POA: Insufficient documentation

## 2011-06-07 HISTORY — PX: ESOPHAGOGASTRODUODENOSCOPY: SHX5428

## 2011-06-07 HISTORY — DX: Gastro-esophageal reflux disease without esophagitis: K21.9

## 2011-06-07 SURGERY — EGD (ESOPHAGOGASTRODUODENOSCOPY)
Anesthesia: Monitor Anesthesia Care

## 2011-06-07 MED ORDER — FENTANYL CITRATE 0.05 MG/ML IJ SOLN
INTRAMUSCULAR | Status: DC | PRN
  Administered 2011-06-07 (×2): 50 ug via INTRAVENOUS

## 2011-06-07 MED ORDER — PROPOFOL 10 MG/ML IV EMUL
INTRAVENOUS | Status: DC | PRN
  Administered 2011-06-07: 1 ug/kg/min via INTRAVENOUS

## 2011-06-07 MED ORDER — LIDOCAINE HCL (CARDIAC) 20 MG/ML IV SOLN
INTRAVENOUS | Status: DC | PRN
  Administered 2011-06-07: 40 mg via INTRAVENOUS
  Administered 2011-06-07: 60 mg via INTRAVENOUS

## 2011-06-07 MED ORDER — PROPOFOL 10 MG/ML IV EMUL: INTRAVENOUS | Status: DC | PRN

## 2011-06-07 MED ORDER — KETAMINE HCL 10 MG/ML IJ SOLN
INTRAMUSCULAR | Status: DC | PRN
  Administered 2011-06-07: 43 mg via INTRAVENOUS

## 2011-06-07 MED ORDER — MIDAZOLAM HCL 5 MG/5ML IJ SOLN
INTRAMUSCULAR | Status: DC | PRN
  Administered 2011-06-07 (×2): 1 mg via INTRAVENOUS

## 2011-06-07 MED ORDER — SODIUM CHLORIDE 0.9 % IV SOLN
Freq: Once | INTRAVENOUS | Status: AC
  Administered 2011-06-07: 20 mL via INTRAVENOUS

## 2011-06-07 NOTE — Telephone Encounter (Signed)
Both sides of pt neck is swollen and sore,  She was advised to take tylenol for the pain.  She says that she has taken tylenol with no relief.  Please advise

## 2011-06-07 NOTE — Preoperative (Signed)
Beta Blockers   Reason not to administer Beta Blockers:Not Applicable 

## 2011-06-07 NOTE — H&P (Signed)
  HPI: This is a with LUQ abd pain.  CT showed slightly dilated SB (equivocal findings), normal CBC, CMET.  + NSAID use    Past Medical History  Diagnosis Date  . Alcohol abuse   . Anxiety   . Wolf-Parkinson-White syndrome   . Arthritis   . Depression   . Bipolar disorder   . Small bowel obstruction 2012  . GERD (gastroesophageal reflux disease)   . Hiatal hernia   . Hemorrhoids   . Wolf-Parkinson-White syndrome     per patient    Past Surgical History  Procedure Date  . Tubal ligation     No current facility-administered medications for this encounter.    Allergies as of 05/10/2011  . (No Known Allergies)    Family History  Problem Relation Age of Onset  . Breast cancer Maternal Aunt   . Colon polyps Maternal Grandmother   . Colon cancer Neg Hx   . Diabetes Maternal Grandmother   . Heart disease Father   . Heart disease Paternal Grandfather   . Heart disease Paternal Grandmother   . Heart attack Maternal Grandmother   . Kidney cancer Maternal Grandmother   . Breast cancer Maternal Grandmother     History   Social History  . Marital Status: Single    Spouse Name: N/A    Number of Children: 4  . Years of Education: N/A   Occupational History  . house wife    Social History Main Topics  . Smoking status: Current Everyday Smoker -- 1.0 packs/day    Types: Cigarettes  . Smokeless tobacco: Never Used  . Alcohol Use: Yes     previous alcohol abuse but for last 2 weeks 2 drinks. not currently drinking alcohol  . Drug Use: No  . Sexually Active: Not on file   Other Topics Concern  . Not on file   Social History Narrative  . No narrative on file      Physical Exam: LMP 05/04/2011 Constitutional: generally well-appearing Psychiatric: alert and oriented x3 Abdomen: soft, nontender, nondistended, no obvious ascites, no peritoneal signs, normal bowel sounds     Assessment and plan: 40 y.o. female with abd pain  Proceed with EGD today

## 2011-06-07 NOTE — Anesthesia Postprocedure Evaluation (Signed)
  Anesthesia Post-op Note  Patient: [name]  Procedure(s) Performed:  ESOPHAGOGASTRODUODENOSCOPY (EGD)  Patient Location: PACU  Anesthesia Type: MAC  Level of Consciousness: awake and alert   Airway and Oxygen Therapy: Patient Spontanous Breathing  Post-op Pain: mild  Post-op Assessment: Post-op Vital signs reviewed, Patient's Cardiovascular Status Stable, Respiratory Function Stable, Patent Airway and No signs of Nausea or vomiting  Post-op Vital Signs: stable  Complications: No apparent anesthesia complications 

## 2011-06-07 NOTE — Telephone Encounter (Signed)
Dr Christella Hartigan consulted and verbal recommendation is for pt to try aleve and throat lozenge for pain relief.  Pt mother agreed and will call with further concerns

## 2011-06-07 NOTE — Transfer of Care (Signed)
Immediate Anesthesia Transfer of Care Note  Patient: [name]  Procedure(s) Performed:  ESOPHAGOGASTRODUODENOSCOPY (EGD)  Patient Location: PACU  Anesthesia Type: MAC  Level of Consciousness: awake  Airway & Oxygen Therapy: Patient Spontanous Breathing and Patient connected to nasal cannula oxygen  Post-op Assessment: Report given to PACU RN  Post vital signs: stable  Complications: No apparent anesthesia complications

## 2011-06-07 NOTE — Anesthesia Postprocedure Evaluation (Signed)
  Anesthesia Post-op Note  Patient: [name]  Procedure(s) Performed:  ESOPHAGOGASTRODUODENOSCOPY (EGD)  Patient Location: PACU  Anesthesia Type: MAC  Level of Consciousness: awake and alert   Airway and Oxygen Therapy: Patient Spontanous Breathing  Post-op Pain: mild  Post-op Assessment: Post-op Vital signs reviewed, Patient's Cardiovascular Status Stable, Respiratory Function Stable, Patent Airway and No signs of Nausea or vomiting  Post-op Vital Signs: stable  Complications: No apparent anesthesia complications

## 2011-06-07 NOTE — Anesthesia Preprocedure Evaluation (Addendum)
Anesthesia Evaluation  Patient identified by MRN, date of birth, ID band Patient awake    Reviewed: H&P , NPO status , Patient's Chart, lab work & pertinent test results  History of Anesthesia Complications Negative for: history of anesthetic complications  Airway Mallampati: II TM Distance: >3 FB Neck ROM: Full    Dental  (+) Edentulous Upper,    Pulmonary Current Smoker (1 pk/day for 30 years),    Pulmonary exam normal       Cardiovascular  Wolf Parkinson White Syndrome   Neuro/Psych PSYCHIATRIC DISORDERS Bipolar Disorder Negative Neurological ROS     GI/Hepatic Neg liver ROS, hiatal hernia, GERD-  Controlled and Medicated,  Endo/Other  Negative Endocrine ROS  Renal/GU negative Renal ROS  Genitourinary negative   Musculoskeletal negative musculoskeletal ROS (+)   Abdominal Normal abdominal exam  (+)   Peds negative pediatric ROS (+)  Hematology negative hematology ROS (+)   Anesthesia Other Findings   Reproductive/Obstetrics negative OB ROS                      Anesthesia Physical Anesthesia Plan  ASA: III  Anesthesia Plan: MAC   Post-op Pain Management:    Induction:   Airway Management Planned: Simple Face Mask  Additional Equipment:   Intra-op Plan:   Post-operative Plan:   Informed Consent: I have reviewed the patients History and Physical, chart, labs and discussed the procedure including the risks, benefits and alternatives for the proposed anesthesia with the patient or authorized representative who has indicated his/her understanding and acceptance.   Dental advisory given  Plan Discussed with: CRNA and Surgeon  Anesthesia Plan Comments:         Anesthesia Quick Evaluation

## 2011-06-11 ENCOUNTER — Telehealth: Payer: Self-pay | Admitting: Gastroenterology

## 2011-06-11 NOTE — Telephone Encounter (Signed)
Pt  has bruising on her abdomen and wanted her biopsy results.  Pt was advised to call her PCP about the bruising and also that her path was not back yet.  I will call her when we receive those results.  Pt agreed

## 2011-06-15 ENCOUNTER — Emergency Department (HOSPITAL_COMMUNITY)
Admission: EM | Admit: 2011-06-15 | Discharge: 2011-06-15 | Disposition: A | Discharge status: Home / Self Care | Payer: Medicaid Other | Attending: Emergency Medicine | Admitting: Emergency Medicine

## 2011-06-15 ENCOUNTER — Encounter (HOSPITAL_COMMUNITY): Payer: Self-pay

## 2011-06-15 ENCOUNTER — Telehealth: Payer: Self-pay | Admitting: Gastroenterology

## 2011-06-15 DIAGNOSIS — K219 Gastro-esophageal reflux disease without esophagitis: Secondary | ICD-10-CM | POA: Insufficient documentation

## 2011-06-15 DIAGNOSIS — H9209 Otalgia, unspecified ear: Secondary | ICD-10-CM | POA: Insufficient documentation

## 2011-06-15 DIAGNOSIS — F319 Bipolar disorder, unspecified: Secondary | ICD-10-CM | POA: Insufficient documentation

## 2011-06-15 DIAGNOSIS — J029 Acute pharyngitis, unspecified: Secondary | ICD-10-CM | POA: Insufficient documentation

## 2011-06-15 MED ORDER — HYDROCODONE-ACETAMINOPHEN 5-325 MG PO TABS
1.0000 | ORAL_TABLET | Freq: Once | ORAL | Status: AC
  Administered 2011-06-15: 1 via ORAL
  Filled 2011-06-15: qty 1

## 2011-06-15 MED ORDER — HYDROCODONE-ACETAMINOPHEN 5-325 MG PO TABS: ORAL_TABLET | ORAL | Status: DC

## 2011-06-15 NOTE — ED Notes (Signed)
Patient presents with sore throat and left ear pain since endoscopy last Thursday with worsening pain over past 3 days.

## 2011-06-15 NOTE — Telephone Encounter (Signed)
Dr Christella Hartigan paged would like to have pt come into the office next week ok to double book  Pt aware of 06/19/11 appt

## 2011-06-15 NOTE — Telephone Encounter (Signed)
Dr Christella Hartigan what would you like to do with this pt?

## 2011-06-15 NOTE — ED Provider Notes (Signed)
History     CSN: [id number] Arrival date & time: 06/15/2011 10:45 AM   First MD Initiated Contact with Patient 06/15/11 1137      Chief Complaint  Patient presents with  . Sore Throat    (Consider location/radiation/quality/duration/timing/severity/associated sxs/prior treatment) HPI Comments: Patient presents with a sore throat and left ear pain, 3 days after endoscopy. She states the symptoms started after the endoscopy. She states that she talked with her GI doctors office and was told to come to the emergency department for evaluation. She has been using Aleve at home for pain with mild relief. The ear pain wakes her up from sleep. She otherwise denies fever, upper respiratory tract infection symptoms, nausea or vomiting. She denies any significant neck swelling or shortness of breath. She denies any significant trouble with swallowing.  Patient is a 40 y.o. female presenting with pharyngitis. The history is provided by the patient.  Sore Throat This is a new problem. The current episode started in the past 7 days. The problem occurs constantly. The problem has been unchanged. Associated symptoms include headaches, neck pain and a sore throat. Pertinent negatives include no chest pain, chills, congestion, coughing, fever, myalgias, nausea, numbness, rash, swollen glands or vomiting.    Past Medical History  Diagnosis Date  . Alcohol abuse   . Anxiety   . Wolf-Parkinson-White syndrome   . Arthritis   . Depression   . Bipolar disorder   . Small bowel obstruction 2012  . GERD (gastroesophageal reflux disease)   . Hiatal hernia   . Hemorrhoids   . Wolf-Parkinson-White syndrome     per patient    Past Surgical History  Procedure Date  . Tubal ligation     Family History  Problem Relation Age of Onset  . Breast cancer Maternal Aunt   . Colon polyps Maternal Grandmother   . Colon cancer Neg Hx   . Diabetes Maternal Grandmother   . Heart disease Father   . Heart disease  Paternal Grandfather   . Heart disease Paternal Grandmother   . Heart attack Maternal Grandmother   . Kidney cancer Maternal Grandmother   . Breast cancer Maternal Grandmother     History  Substance Use Topics  . Smoking status: Current Everyday Smoker -- 1.0 packs/day    Types: Cigarettes  . Smokeless tobacco: Never Used  . Alcohol Use: Yes     previous alcohol abuse but for last 2 weeks 2 drinks. not currently drinking alcohol    OB History    Grav Para Term Preterm Abortions TAB SAB Ect Mult Living                  Review of Systems  Constitutional: Negative for fever and chills.  HENT: Positive for ear pain, sore throat and neck pain. Negative for congestion, rhinorrhea and neck stiffness.   Eyes: Negative for discharge.  Respiratory: Negative for cough and shortness of breath.   Cardiovascular: Negative for chest pain.  Gastrointestinal: Negative for nausea and vomiting.  Genitourinary: Negative for dysuria and hematuria.  Musculoskeletal: Negative for myalgias.  Skin: Negative for rash.  Neurological: Positive for headaches. Negative for numbness.  Psychiatric/Behavioral: Negative for confusion.    Allergies  Review of patient's allergies indicates no known allergies.  Home Medications   Current Outpatient Rx  Name Route Sig Dispense Refill  . CITALOPRAM HYDROBROMIDE 20 MG PO TABS Oral Take 20 mg by mouth daily.      Marland Kitchen DIVALPROEX SODIUM 500 MG PO  TB24 Oral Take 1,000 mg by mouth daily.      Marland Kitchen GABAPENTIN 100 MG PO CAPS Oral Take 300 mg by mouth at bedtime.     Marland Kitchen NAPROXEN SODIUM 220 MG PO TABS Oral Take 440 mg by mouth 2 (two) times daily.      . THROAT SPRAY MT LIQD Mouth/Throat Use as directed 1 spray in the mouth or throat as needed. As needed for sore throat.     Marland Kitchen HYDROCODONE-ACETAMINOPHEN 5-325 MG PO TABS  Take 1-2 tablets every 6 hours as needed for severe pain 10 tablet 0    BP 113/76  Pulse 67  Temp(Src) 98.3 F (36.8 C) (Oral)  Resp 18  SpO2 97%   LMP 06/14/2011  Physical Exam  Nursing note and vitals reviewed. Constitutional: She is oriented to person, place, and time. She appears well-developed and well-nourished.  HENT:  Head: Normocephalic and atraumatic. No trismus in the jaw.  Right Ear: Tympanic membrane and external ear normal. No drainage or tenderness.  Left Ear: Tympanic membrane and external ear normal. No drainage or tenderness.  Mouth/Throat: Oropharynx is clear and moist and mucous membranes are normal. No uvula swelling. No oropharyngeal exudate.  Eyes: Pupils are equal, round, and reactive to light. Right eye exhibits no discharge. Left eye exhibits no discharge.  Neck: Normal range of motion. Neck supple.  Cardiovascular: Normal rate and regular rhythm.  Exam reveals no gallop and no friction rub.   No murmur heard. Pulmonary/Chest: Effort normal and breath sounds normal. No respiratory distress. She has no wheezes.  Abdominal: Soft. There is no tenderness. There is no rebound and no guarding.  Musculoskeletal: Normal range of motion.  Neurological: She is alert and oriented to person, place, and time.  Skin: Skin is warm and dry. No rash noted.  Psychiatric: She has a normal mood and affect.    ED Course  Procedures (including critical care time)  Labs Reviewed - No data to display No results found.   1. Ear pain    12:03 PM patient was seen and examined. The patient was discussed with Dr. Fredricka Bonine. Will treat patients with pain medicine and anti-inflammatories given the lack of infectious symptoms found on exam and history. Will have patient followup as needed with her GI physician. Patient urged to return with worsening pain, fever, or shortness of breath. 12:03 PM Patient counseled on use of narcotic pain medications. Counseled not to combine these medications with others containing tylenol. Urged not to drink alcohol, drive, or perform any other activities that requires focus while taking these medications.  The patient verbalizes understanding and agrees with the plan.    MDM  Patient with sore throat and ear pain status post recent endoscopy. This may be related to some residual irritation from the endoscopy. There are no findings on physical exam to suggest infection. The patient has not had any shortness of breath or airway swelling. No trouble swallowing. She is stable for discharge home.       Carolee Rota, [place] 06/15/11 1204

## 2011-06-16 NOTE — ED Provider Notes (Signed)
Evaluation and management procedures were performed by the PA/NP under my supervision/collaboration.    [name] [name], MD 06/16/11 1435 

## 2011-06-19 ENCOUNTER — Ambulatory Visit: Payer: Self-pay | Admitting: Gastroenterology

## 2011-06-22 ENCOUNTER — Encounter (HOSPITAL_COMMUNITY): Payer: Self-pay | Admitting: Gastroenterology

## 2011-06-24 ENCOUNTER — Emergency Department (HOSPITAL_COMMUNITY)
Admission: EM | Admit: 2011-06-24 | Discharge: 2011-06-24 | Disposition: A | Discharge status: Home / Self Care | Payer: Medicaid Other | Attending: Emergency Medicine | Admitting: Emergency Medicine

## 2011-06-24 ENCOUNTER — Encounter (HOSPITAL_COMMUNITY): Payer: Self-pay | Admitting: *Deleted

## 2011-06-24 DIAGNOSIS — K08409 Partial loss of teeth, unspecified cause, unspecified class: Secondary | ICD-10-CM | POA: Insufficient documentation

## 2011-06-24 DIAGNOSIS — R51 Headache: Secondary | ICD-10-CM | POA: Insufficient documentation

## 2011-06-24 DIAGNOSIS — K08109 Complete loss of teeth, unspecified cause, unspecified class: Secondary | ICD-10-CM | POA: Insufficient documentation

## 2011-06-24 DIAGNOSIS — H9209 Otalgia, unspecified ear: Secondary | ICD-10-CM | POA: Insufficient documentation

## 2011-06-24 DIAGNOSIS — F17200 Nicotine dependence, unspecified, uncomplicated: Secondary | ICD-10-CM | POA: Insufficient documentation

## 2011-06-24 DIAGNOSIS — K140 Glossitis: Secondary | ICD-10-CM | POA: Insufficient documentation

## 2011-06-24 DIAGNOSIS — K219 Gastro-esophageal reflux disease without esophagitis: Secondary | ICD-10-CM | POA: Insufficient documentation

## 2011-06-24 DIAGNOSIS — K089 Disorder of teeth and supporting structures, unspecified: Secondary | ICD-10-CM | POA: Insufficient documentation

## 2011-06-24 DIAGNOSIS — F319 Bipolar disorder, unspecified: Secondary | ICD-10-CM | POA: Insufficient documentation

## 2011-06-24 DIAGNOSIS — K1370 Unspecified lesions of oral mucosa: Secondary | ICD-10-CM | POA: Insufficient documentation

## 2011-06-24 LAB — GLUCOSE, CAPILLARY: Glucose-Capillary: 91 mg/dL (ref 70–99)

## 2011-06-24 MED ORDER — MAGIC MOUTHWASH W/LIDOCAINE: 5.0000 mL | Freq: Three times a day (TID) | ORAL | Status: DC

## 2011-06-24 MED ORDER — LIDOCAINE VISCOUS 2 % MT SOLN
20.0000 mL | Freq: Once | OROMUCOSAL | Status: AC
  Administered 2011-06-24: 20 mL via OROMUCOSAL
  Filled 2011-06-24: qty 15

## 2011-06-24 MED ORDER — HYDROCODONE-ACETAMINOPHEN 5-325 MG PO TABS: 2.0000 | ORAL_TABLET | ORAL | Status: AC | PRN

## 2011-06-24 NOTE — ED Provider Notes (Signed)
History     CSN: [id number] Arrival date & time: 06/24/2011  3:20 PM   First MD Initiated Contact with Patient 06/24/11 1617      Chief Complaint  Patient presents with  . Mouth Lesions  . Headache  . Otalgia  . Facial Pain     HPI  55 old female presents with mouth ulcers. The patient states that she's been having pain in the lateral aspects of both tongue for the past 2 weeks. The pain is shooting from her tongue to her face her head, and her ears. She states that the ulcers are getting worse. She states that she does have a problem with moving her tongue at night and possibly grinding them on her bottom teeth. She does not have any upper teeth. She has been taking otc "mouth sore relief liquid". Denies history of HIV, IV drug abuse, diabetes. She is not having a sore throat, difficulty swallowing, neck swelling. She denies headache.    Jodi Marble, RN 06/24/2011 15:23     Patient with facial pain and tongues, patient states that her gums hurts and she has lesions on her gums and she has teeth that are broken. Has been experiencing this pain for about two weeks.      Past Medical History  Diagnosis Date  . Alcohol abuse   . Anxiety   . Wolf-Parkinson-White syndrome   . Arthritis   . Depression   . Bipolar disorder   . Small bowel obstruction 2012  . GERD (gastroesophageal reflux disease)   . Hiatal hernia   . Hemorrhoids   . Wolf-Parkinson-White syndrome     per patient    Past Surgical History  Procedure Date  . Tubal ligation   . Esophagogastroduodenoscopy 06/07/2011    Procedure: ESOPHAGOGASTRODUODENOSCOPY (EGD);  Surgeon: Rob Bunting, MD;  Location: [facility];  Service: Endoscopy;  Laterality: N/A;    Family History  Problem Relation Age of Onset  . Breast cancer Maternal Aunt   . Colon polyps Maternal Grandmother   . Colon cancer Neg Hx   . Diabetes Maternal Grandmother   . Heart disease Father   . Heart disease Paternal Grandfather   .  Heart disease Paternal Grandmother   . Heart attack Maternal Grandmother   . Kidney cancer Maternal Grandmother   . Breast cancer Maternal Grandmother     History  Substance Use Topics  . Smoking status: Current Everyday Smoker -- 1.0 packs/day    Types: Cigarettes  . Smokeless tobacco: Never Used  . Alcohol Use: No     previous alcohol abuse but for last 2 weeks 2 drinks. not currently drinking alcohol    OB History    Grav Para Term Preterm Abortions TAB SAB Ect Mult Living                  Review of Systems  All other systems reviewed and are negative.   except as noted HPI   Allergies  Codeine  Home Medications     BP 118/88  Pulse 80  Temp 97.9 F (36.6 C)  Resp 20  SpO2 97%  LMP 06/14/2011  Physical Exam  Nursing note and vitals reviewed. Constitutional: She is oriented to person, place, and time. She appears well-developed.  HENT:  Head: Atraumatic.  Mouth/Throat: Oropharynx is clear and moist.       Poor dentition, there are no upper teeth  B/l lateral aspect of tongue with superficial ulcers  Eyes: Conjunctivae and EOM are  normal. Pupils are equal, round, and reactive to light.  Neck: Normal range of motion. Neck supple.  Cardiovascular: Normal rate, regular rhythm, normal heart sounds and intact distal pulses.   Pulmonary/Chest: Effort normal and breath sounds normal. No respiratory distress. She has no wheezes. She has no rales.  Abdominal: Soft. She exhibits no distension. There is no tenderness. There is no rebound and no guarding.  Musculoskeletal: Normal range of motion.  Neurological: She is alert and oriented to person, place, and time.  Skin: Skin is warm and dry. No rash noted.  Psychiatric: She has a normal mood and affect.    ED Course  Procedures (including critical care time)   Labs Reviewed  GLUCOSE, CAPILLARY  POCT CBG MONITORING   No results found.   1. Ulcerated tongue       MDM  Viscous lidocaine swish here.  Glu wnl. Magic mouth wash for home. Advised to f/u with PMD-- may need eval for HIV, tongue cancer.    Stefano Gaul, MD         Forbes Cellar, MD 06/24/11 [phone]

## 2011-06-24 NOTE — ED Notes (Signed)
Patient with facial pain and tongues, patient states that her gums hurts and she has lesions on her gums and she has teeth that are broken.  Has been experiencing this pain for about two weeks.

## 2011-07-10 ENCOUNTER — Ambulatory Visit (INDEPENDENT_AMBULATORY_CARE_PROVIDER_SITE_OTHER): Payer: Medicaid Other | Admitting: Gastroenterology

## 2011-07-10 ENCOUNTER — Encounter: Payer: Self-pay | Admitting: Gastroenterology

## 2011-07-10 ENCOUNTER — Other Ambulatory Visit: Payer: Self-pay | Admitting: Gastroenterology

## 2011-07-10 VITALS — BP 92/60 | HR 76 | Ht 66.0 in | Wt 243.0 lb

## 2011-07-10 DIAGNOSIS — K5900 Constipation, unspecified: Secondary | ICD-10-CM

## 2011-07-10 DIAGNOSIS — R109 Unspecified abdominal pain: Secondary | ICD-10-CM

## 2011-07-10 MED ORDER — PEG-KCL-NACL-NASULF-NA ASC-C 100 G PO SOLR: 1.0000 | ORAL | Status: DC

## 2011-07-10 MED ORDER — OMEPRAZOLE 40 MG PO CPDR: 40.0000 mg | DELAYED_RELEASE_CAPSULE | Freq: Every day | ORAL | Status: DC

## 2011-07-10 NOTE — Progress Notes (Signed)
Review of pertinent gastrointestinal problems: 1. intermittent left upper quadrant pains: October 2012 normal labs including CBC, complete metabolic profile, CT scan normal except for "possibly slightly dilated small bowel loops." EGD November 2012 showed mild, H. pylori negative gastritis, retained solid food in stomach suggesting gastroparesis. Presented to emergency room 3 days after endoscopy with sore throat, ear pain however physical exam was normal per ER physician. Presented back to the emergency room 2 weeks after endoscopy with painful tongue, found to have ulcers on tongue.  She told me this was because of very poor dentition and so had some teeth pulled.     HPI: This is a pleasant 40 year old woman whom I last saw about 2 months ago at the time of an upper endoscopy.  Her grandmother had several polyps.  She has chronic constipation, has to push and strain a lot.  Has seen red blood in her stool in past.  Her stomach burns constantly, hurts constantly.  Alleve does not work for her.  Take 2 alleve a day usually.  She eats twice daily and then wakes up during middle of night and eats also (snacks).   Past Medical History  Diagnosis Date  . Alcohol abuse   . Anxiety   . Wolf-Parkinson-White syndrome   . Arthritis   . Depression   . Bipolar disorder   . Small bowel obstruction 2012  . GERD (gastroesophageal reflux disease)   . Hiatal hernia   . Hemorrhoids   . Wolf-Parkinson-White syndrome     per patient    Past Surgical History  Procedure Date  . Tubal ligation   . Esophagogastroduodenoscopy 06/07/2011    Procedure: ESOPHAGOGASTRODUODENOSCOPY (EGD);  Surgeon: Rob Bunting, MD;  Location: [facility];  Service: Endoscopy;  Laterality: N/A;    Current Outpatient Prescriptions  Medication Sig Dispense Refill  . divalproex (DEPAKOTE) 500 MG 24 hr tablet Take 1,500 mg by mouth daily.       Marland Kitchen escitalopram (LEXAPRO) 20 MG tablet Take 20 mg by mouth daily.        .  risperiDONE (RISPERDAL) 2 MG tablet Take 2 mg by mouth at bedtime.          Allergies as of 07/10/2011 - Review Complete 07/10/2011  Allergen Reaction Noted  . Codeine Nausea Only 06/24/2011    Family History  Problem Relation Age of Onset  . Breast cancer Maternal Aunt   . Colon polyps Maternal Grandmother   . Colon cancer Neg Hx   . Diabetes Maternal Grandmother   . Heart disease Father   . Heart disease Paternal Grandfather   . Heart disease Paternal Grandmother   . Heart attack Maternal Grandmother   . Kidney cancer Maternal Grandmother   . Breast cancer Maternal Grandmother     History   Social History  . Marital Status: Single    Spouse Name: N/A    Number of Children: 4  . Years of Education: N/A   Occupational History  . house wife    Social History Main Topics  . Smoking status: Current Everyday Smoker -- 1.0 packs/day    Types: Cigarettes  . Smokeless tobacco: Never Used  . Alcohol Use: No     previous alcohol abuse but for last 2 weeks 2 drinks. not currently drinking alcohol  . Drug Use: No  . Sexually Active: Not on file   Other Topics Concern  . Not on file   Social History Narrative  . No narrative on file  Physical Exam: BP 92/60  Pulse 76  Ht 5\' 6"  (1.676 m)  Wt 243 lb (110.224 kg)  BMI 39.22 kg/m2  LMP 07/10/2011 Constitutional: generally well-appearing Psychiatric: alert and oriented x3 Abdomen: soft, nontender, nondistended, no obvious ascites, no peritoneal signs, normal bowel sounds     Assessment and plan: 40 y.o. female with chronic abdominal pains, family history of colon polyps, minor rectal bleeding, chronic constipation  She is not taking proton pump inhibitors currently. I have given her a new prescription for omeprazole which you'll take once daily. She has a lot of lifestyle modifications to work on to help with GERD also including obesity, smoking cigarettes. She eats meals in the middle of the night as well. I  suspect gastroparesis may be playing some role in her symptoms given the retained solid food on EGD and I have recommended for the 5 smaller meals a day rather than 2-3 large ones. She has a family history of colon polyps as well as personal constipation, minor rectal bleeding and we'll proceed with colonoscopy with propofol sedation.  She asked for narcotic pain medicine again which I am not comfortable prescribing.

## 2011-07-10 NOTE — Patient Instructions (Addendum)
You have been given a separate informational sheet regarding your tobacco use, the importance of quitting and local resources to help you quit. You will be set up for a colonoscopy at [facility] with propofol sedation. New prescription called in for omeprazole, take one 20-30 min before lunch meal. GERD handout given. Try eating smaller more frequent meals (you should eat 4-5 small meals a day rather than 2-3 big meals).    Gastroesophageal reflux disease (GERD) happens when acid from your stomach flows up into the esophagus. When acid comes in contact with the esophagus, the acid causes soreness (inflammation) in the esophagus. Over time, GERD may create small holes (ulcers) in the lining of the esophagus. CAUSES   Increased body weight. This puts pressure on the stomach, making acid rise from the stomach into the esophagus.   Smoking. This increases acid production in the stomach.   Drinking alcohol. This causes decreased pressure in the lower esophageal sphincter (valve or ring of muscle between the esophagus and stomach), allowing acid from the stomach into the esophagus.   Late evening meals and a full stomach. This increases pressure and acid production in the stomach.   A malformed lower esophageal sphincter.  Sometimes, no cause is found. SYMPTOMS   Burning pain in the lower part of the mid-chest behind the breastbone and in the mid-stomach area. This may occur twice a week or more often.   Trouble swallowing.   Sore throat.   Dry cough.   Asthma-like symptoms including chest tightness, shortness of breath, or wheezing.  DIAGNOSIS  Your caregiver may be able to diagnose GERD based on your symptoms. In some cases, X-rays and other tests may be done to check for complications or to check the condition of your stomach and esophagus. TREATMENT  Your caregiver may recommend over-the-counter or prescription medicines to help decrease acid production. Ask your caregiver before starting or  adding any new medicines.  HOME CARE INSTRUCTIONS   Change the factors that you can control. Ask your caregiver for guidance concerning weight loss, quitting smoking, and alcohol consumption.   Avoid foods and drinks that make your symptoms worse, such as:   Caffeine or alcoholic drinks.   Chocolate.   Peppermint or mint flavorings.   Garlic and onions.   Spicy foods.   Citrus fruits, such as oranges, lemons, or limes.   Tomato-based foods such as sauce, chili, salsa, and pizza.   Fried and fatty foods.   Avoid lying down for the 3 hours prior to your bedtime or prior to taking a nap.   Eat small, frequent meals instead of large meals.   Wear loose-fitting clothing. Do not wear anything tight around your waist that causes pressure on your stomach.   Raise the head of your bed 6 to 8 inches with wood blocks to help you sleep. Extra pillows will not help.   Only take over-the-counter or prescription medicines for pain, discomfort, or fever as directed by your caregiver.   Do not take aspirin, ibuprofen, or other nonsteroidal anti-inflammatory drugs (NSAIDs).  SEEK IMMEDIATE MEDICAL CARE IF:   You have pain in your arms, neck, jaw, teeth, or back.   Your pain increases or changes in intensity or duration.   You develop nausea, vomiting, or sweating (diaphoresis).   You develop shortness of breath, or you faint.   Your vomit is green, yellow, black, or looks like coffee grounds or blood.   Your stool is red, bloody, or black.  These symptoms could  be signs of other problems, such as heart disease, gastric bleeding, or esophageal bleeding. MAKE SURE YOU:   Understand these instructions.   Will watch your condition.   Will get help right away if you are not doing well or get worse.  Document Released: 04/25/2005 Document Revised: 03/28/2011 Document Reviewed: 02/02/2011 [facility] Patient Information 2012 [place], [place].

## 2011-07-27 ENCOUNTER — Other Ambulatory Visit: Payer: Self-pay | Admitting: Orthopedic Surgery

## 2011-07-27 DIAGNOSIS — M545 Low back pain: Secondary | ICD-10-CM

## 2011-08-02 ENCOUNTER — Other Ambulatory Visit: Payer: Medicaid Other

## 2011-08-14 ENCOUNTER — Encounter (HOSPITAL_COMMUNITY): Payer: Self-pay | Admitting: *Deleted

## 2011-08-16 ENCOUNTER — Encounter (HOSPITAL_COMMUNITY): Payer: Self-pay | Admitting: Gastroenterology

## 2011-08-16 ENCOUNTER — Telehealth: Payer: Self-pay

## 2011-08-16 ENCOUNTER — Encounter (HOSPITAL_COMMUNITY)
Admission: RE | Disposition: A | Discharge status: Home / Self Care | Payer: Self-pay | Source: Ambulatory Visit | Attending: Gastroenterology

## 2011-08-16 ENCOUNTER — Encounter (HOSPITAL_COMMUNITY): Payer: Self-pay | Admitting: Anesthesiology

## 2011-08-16 ENCOUNTER — Ambulatory Visit (HOSPITAL_COMMUNITY)
Admission: RE | Admit: 2011-08-16 | Discharge: 2011-08-16 | Disposition: A | Discharge status: Home / Self Care | Payer: Medicaid Other | Source: Ambulatory Visit | Attending: Gastroenterology | Admitting: Gastroenterology

## 2011-08-16 ENCOUNTER — Ambulatory Visit (HOSPITAL_COMMUNITY): Payer: Medicaid Other | Admitting: Anesthesiology

## 2011-08-16 DIAGNOSIS — K625 Hemorrhage of anus and rectum: Secondary | ICD-10-CM | POA: Insufficient documentation

## 2011-08-16 DIAGNOSIS — Z8371 Family history of colonic polyps: Secondary | ICD-10-CM | POA: Insufficient documentation

## 2011-08-16 DIAGNOSIS — K5900 Constipation, unspecified: Secondary | ICD-10-CM | POA: Insufficient documentation

## 2011-08-16 DIAGNOSIS — Z83719 Family history of colon polyps, unspecified: Secondary | ICD-10-CM | POA: Insufficient documentation

## 2011-08-16 DIAGNOSIS — K589 Irritable bowel syndrome without diarrhea: Secondary | ICD-10-CM

## 2011-08-16 DIAGNOSIS — F17200 Nicotine dependence, unspecified, uncomplicated: Secondary | ICD-10-CM | POA: Insufficient documentation

## 2011-08-16 DIAGNOSIS — K219 Gastro-esophageal reflux disease without esophagitis: Secondary | ICD-10-CM | POA: Insufficient documentation

## 2011-08-16 DIAGNOSIS — R32 Unspecified urinary incontinence: Secondary | ICD-10-CM | POA: Insufficient documentation

## 2011-08-16 DIAGNOSIS — K581 Irritable bowel syndrome with constipation: Secondary | ICD-10-CM

## 2011-08-16 HISTORY — DX: Personal history of other diseases of the digestive system: Z87.19

## 2011-08-16 SURGERY — COLONOSCOPY WITH PROPOFOL
Anesthesia: Monitor Anesthesia Care

## 2011-08-16 MED ORDER — FENTANYL CITRATE 0.05 MG/ML IJ SOLN
INTRAMUSCULAR | Status: DC | PRN
  Administered 2011-08-16: 100 ug via INTRAVENOUS

## 2011-08-16 MED ORDER — LIDOCAINE HCL 1 % IJ SOLN
INTRAMUSCULAR | Status: DC | PRN
  Administered 2011-08-16: 40 mg via INTRADERMAL

## 2011-08-16 MED ORDER — MIDAZOLAM HCL 5 MG/5ML IJ SOLN
INTRAMUSCULAR | Status: DC | PRN
  Administered 2011-08-16: 2 mg via INTRAVENOUS

## 2011-08-16 MED ORDER — PROPOFOL 10 MG/ML IV EMUL
INTRAVENOUS | Status: DC | PRN
  Administered 2011-08-16: 100 ug/kg/min via INTRAVENOUS

## 2011-08-16 MED ORDER — LACTATED RINGERS IV SOLN
INTRAVENOUS | Status: DC | PRN
  Administered 2011-08-16: 10:00:00 via INTRAVENOUS

## 2011-08-16 NOTE — H&P (Signed)
  HPI: This is a woman with constipation, gm with colon polyps    Past Medical History  Diagnosis Date  . Alcohol abuse   . Anxiety   . Wolf-Parkinson-White syndrome   . Arthritis   . Depression   . Bipolar disorder   . Small bowel obstruction 2012  . GERD (gastroesophageal reflux disease)   . Hiatal hernia   . Hemorrhoids   . Wolf-Parkinson-White syndrome     per patient    Past Surgical History  Procedure Date  . Tubal ligation   . Esophagogastroduodenoscopy 06/07/2011    Procedure: ESOPHAGOGASTRODUODENOSCOPY (EGD);  Surgeon: Rob Bunting, MD;  Location: [facility];  Service: Endoscopy;  Laterality: N/A;    No current facility-administered medications for this encounter.    Allergies as of 07/10/2011 - Review Complete 07/10/2011  Allergen Reaction Noted  . Codeine Nausea Only 06/24/2011    Family History  Problem Relation Age of Onset  . Breast cancer Maternal Aunt   . Colon polyps Maternal Grandmother   . Diabetes Maternal Grandmother   . Heart attack Maternal Grandmother   . Kidney cancer Maternal Grandmother   . Breast cancer Maternal Grandmother   . Colon cancer Neg Hx   . Anesthesia problems Neg Hx   . Hypotension Neg Hx   . Malignant hyperthermia Neg Hx   . Pseudochol deficiency Neg Hx   . Heart disease Father   . Heart disease Paternal Grandfather   . Heart disease Paternal Grandmother     History   Social History  . Marital Status: Single    Spouse Name: N/A    Number of Children: 4  . Years of Education: N/A   Occupational History  . house wife    Social History Main Topics  . Smoking status: Current Everyday Smoker -- 1.0 packs/day    Types: Cigarettes  . Smokeless tobacco: Never Used  . Alcohol Use: No     previous alcohol abuse but for last 2 weeks 2 drinks. not currently drinking alcohol  . Drug Use: No  . Sexually Active: Yes    Birth Control/ Protection: None   Other Topics Concern  . Not on file   Social History  Narrative  . No narrative on file      Physical Exam: LMP 08/11/2011 Constitutional: generally well-appearing Psychiatric: alert and oriented x3 Abdomen: soft, nontender, nondistended, no obvious ascites, no peritoneal signs, normal bowel sounds     Assessment and plan: 41 y.o. female with constipation, fh colon polyps  For colonoscopy today with mac sedation

## 2011-08-16 NOTE — Op Note (Signed)
[facility] [address] [place], [place]  [address]  COLONOSCOPY PROCEDURE REPORT  PATIENT:  [name], [name]  MR#:  [id number] BIRTHDATE:  November 10, 1970, 40 yrs. old  GENDER:  female ENDOSCOPIST:  Rachael Fee, MD PROCEDURE DATE:  08/16/2011 PROCEDURE:  Colonoscopy 19147 ASA CLASS:  Class II INDICATIONS:  constipation, minor rectal bleeding, fh colon polyps  MEDICATIONS:   MAC sedation, administered by CRNA  DESCRIPTION OF PROCEDURE:   After the risks benefits and alternatives of the procedure were thoroughly explained, informed consent was obtained.  No rectal exam performed. The Pentax Colonoscope [id number] endoscope was introduced through the anus and advanced to the cecum, which was identified by the ileocecal valve, without limitations.  The quality of the prep was poor.. The instrument was then slowly withdrawn as the colon was fully examined. <<PROCEDUREIMAGES>>  FINDINGS:  The prep was not adequate to allow appropriate inspection of the mucosa. (see image001, image002, image003, image004, and image005).   Retroflexed views in the rectum revealed no abnormalities.  COMPLICATIONS:  None  ENDOSCOPIC IMPRESSION: 1) Poor prep did not allow adequate inspection of the colon. There are no obstructing cancers, but smaller significant lesions could have been missed.  RECOMMENDATIONS: Dr. Christella Hartigan' office will contact you about repeating this procedure with "double prep."  It will be very important that you drink ALL of the recommended prep at proper times (you did not complete the prep completely prior to this examination). You mentioned problems with bladder incontinence and so a urology referral will be made as well.  ______________________________ Rachael Fee, MD  n. eSIGNED:   Rachael Fee at 08/16/2011 11:30 AM  Franne Forts, [phone]

## 2011-08-16 NOTE — Anesthesia Preprocedure Evaluation (Addendum)
Anesthesia Evaluation  Patient identified by MRN, date of birth, ID band Patient awake    Reviewed: Allergy & Precautions, H&P , NPO status , Patient's Chart, lab work & pertinent test results  Airway Mallampati: III TM Distance: >3 FB Neck ROM: full    Dental  (+) Edentulous Upper and Dental Advisory Given   Pulmonary Current Smoker,  clear to auscultation  Pulmonary exam normal       Cardiovascular Exercise Tolerance: Good + dysrhythmias regular Normal WPW  Syndrome.  Doesn't give any trouble.   Neuro/Psych Anxiety Depression Bipolar Disorder Negative Neurological ROS  Negative Psych ROS   GI/Hepatic hiatal hernia, GERD-  Medicated and Controlled,(+)     substance abuse  alcohol use,   Endo/Other  Negative Endocrine ROS  Renal/GU negative Renal ROS  Genitourinary negative   Musculoskeletal   Abdominal   Peds  Hematology negative hematology ROS (+)   Anesthesia Other Findings   Reproductive/Obstetrics negative OB ROS                         Anesthesia Physical Anesthesia Plan  ASA: III  Anesthesia Plan: MAC   Post-op Pain Management:    Induction:   Airway Management Planned: Simple Face Mask  Additional Equipment:   Intra-op Plan:   Post-operative Plan:   Informed Consent: I have reviewed the patients History and Physical, chart, labs and discussed the procedure including the risks, benefits and alternatives for the proposed anesthesia with the patient or authorized representative who has indicated his/her understanding and acceptance.   Dental Advisory Given  Plan Discussed with: CRNA and Surgeon  Anesthesia Plan Comments:        Anesthesia Quick Evaluation

## 2011-08-16 NOTE — Telephone Encounter (Signed)
Message copied by Donata Duff on Thu Aug 16, 2011  1:09 PM ------      Message from: Rob Bunting P      Created: Thu Aug 16, 2011 11:32 AM       She was poorly prepped for colonoscopy today.  Needs repeat colonoscopy at [facility] with propofol, "double prep."            Also she mentioned urinary incontinence and she wanted referral to urology, lets set that up.              Thanks

## 2011-08-16 NOTE — Anesthesia Postprocedure Evaluation (Signed)
  Anesthesia Post-op Note  Patient: [name]  Procedure(s) Performed:  COLONOSCOPY WITH PROPOFOL  Patient Location: PACU  Anesthesia Type: MAC  Level of Consciousness: awake and alert   Airway and Oxygen Therapy: Patient Spontanous Breathing  Post-op Pain: mild  Post-op Assessment: Post-op Vital signs reviewed, Patient's Cardiovascular Status Stable, Respiratory Function Stable, Patent Airway and No signs of Nausea or vomiting  Post-op Vital Signs: stable  Complications: No apparent anesthesia complications

## 2011-08-20 NOTE — Transfer of Care (Signed)
Immediate Anesthesia Transfer of Care Note  Patient: [name]  Procedure(s) Performed:  COLONOSCOPY WITH PROPOFOL  Patient Location: PACU  Anesthesia Type: MAC  Level of Consciousness: awake, alert  and oriented  Airway & Oxygen Therapy: Patient Spontanous Breathing, Patient connected to nasal cannula oxygen and Patient connected to face mask oxygen  Post-op Assessment: Report given to PACU RN, Post -op Vital signs reviewed and stable and Patient moving all extremities X 4  Post vital signs: Reviewed and stable  Complications: No apparent anesthesia complications

## 2011-08-20 NOTE — Telephone Encounter (Signed)
Unable to reach pt by phone letter mailed.  

## 2011-08-20 NOTE — Telephone Encounter (Signed)
Left message on machine to call back  

## 2011-08-24 ENCOUNTER — Telehealth: Payer: Self-pay | Admitting: Gastroenterology

## 2011-08-24 NOTE — Telephone Encounter (Signed)
Message copied by Arna Snipe on Fri Aug 24, 2011 10:52 AM ------      Message from: Donata Duff      Created: Tue Jun 19, 2011  2:04 PM       Do not bill

## 2012-01-02 ENCOUNTER — Telehealth: Payer: Self-pay | Admitting: Gastroenterology

## 2012-01-02 DIAGNOSIS — R32 Unspecified urinary incontinence: Secondary | ICD-10-CM

## 2012-01-03 ENCOUNTER — Other Ambulatory Visit: Payer: Self-pay

## 2012-01-03 NOTE — Telephone Encounter (Signed)
Pt has been scheduled for colon and previsit referral to [facility] records made

## 2012-01-11 ENCOUNTER — Telehealth: Payer: Self-pay | Admitting: *Deleted

## 2012-01-11 NOTE — Telephone Encounter (Signed)
No show for previsit.  Unable to reach by phone.  Sent no show letter and canceled colonoscopy

## 2012-01-11 NOTE — Telephone Encounter (Signed)
Merri Ray CMA notified of pt. Not showing for her previsit.  Wyona Almas

## 2012-01-15 ENCOUNTER — Ambulatory Visit (AMBULATORY_SURGERY_CENTER): Payer: Medicaid Other | Admitting: *Deleted

## 2012-01-15 VITALS — Ht 66.0 in | Wt 222.4 lb

## 2012-01-15 DIAGNOSIS — R109 Unspecified abdominal pain: Secondary | ICD-10-CM

## 2012-01-15 DIAGNOSIS — K5900 Constipation, unspecified: Secondary | ICD-10-CM

## 2012-01-15 MED ORDER — MOVIPREP 100 G PO SOLR: ORAL | Status: DC

## 2012-01-17 ENCOUNTER — Encounter (HOSPITAL_COMMUNITY)
Admission: RE | Disposition: A | Discharge status: Home / Self Care | Payer: Self-pay | Source: Ambulatory Visit | Attending: Gastroenterology

## 2012-01-17 ENCOUNTER — Encounter (HOSPITAL_COMMUNITY): Payer: Self-pay | Admitting: Anesthesiology

## 2012-01-17 ENCOUNTER — Ambulatory Visit (HOSPITAL_COMMUNITY)
Admission: RE | Admit: 2012-01-17 | Discharge: 2012-01-17 | Disposition: A | Discharge status: Home / Self Care | Payer: Medicaid Other | Source: Ambulatory Visit | Attending: Gastroenterology | Admitting: Gastroenterology

## 2012-01-17 ENCOUNTER — Encounter (HOSPITAL_COMMUNITY): Payer: Self-pay

## 2012-01-17 ENCOUNTER — Ambulatory Visit (HOSPITAL_COMMUNITY): Payer: Medicaid Other | Admitting: Anesthesiology

## 2012-01-17 DIAGNOSIS — K219 Gastro-esophageal reflux disease without esophagitis: Secondary | ICD-10-CM | POA: Insufficient documentation

## 2012-01-17 DIAGNOSIS — K589 Irritable bowel syndrome without diarrhea: Secondary | ICD-10-CM

## 2012-01-17 DIAGNOSIS — K648 Other hemorrhoids: Secondary | ICD-10-CM

## 2012-01-17 DIAGNOSIS — R109 Unspecified abdominal pain: Secondary | ICD-10-CM | POA: Insufficient documentation

## 2012-01-17 DIAGNOSIS — K581 Irritable bowel syndrome with constipation: Secondary | ICD-10-CM

## 2012-01-17 DIAGNOSIS — K5900 Constipation, unspecified: Secondary | ICD-10-CM | POA: Insufficient documentation

## 2012-01-17 SURGERY — COLONOSCOPY WITH PROPOFOL
Anesthesia: Monitor Anesthesia Care

## 2012-01-17 MED ORDER — GLYCOPYRROLATE 0.2 MG/ML IJ SOLN
INTRAMUSCULAR | Status: DC | PRN
  Administered 2012-01-17: 0.2 mg via INTRAVENOUS

## 2012-01-17 MED ORDER — FENTANYL CITRATE 0.05 MG/ML IJ SOLN
INTRAMUSCULAR | Status: DC | PRN
  Administered 2012-01-17 (×2): 50 ug via INTRAVENOUS

## 2012-01-17 MED ORDER — KETAMINE HCL 10 MG/ML IJ SOLN
INTRAMUSCULAR | Status: DC | PRN
  Administered 2012-01-17: 40 mg via INTRAVENOUS

## 2012-01-17 MED ORDER — LACTATED RINGERS IV SOLN
INTRAVENOUS | Status: DC
  Administered 2012-01-17: 1000 mL via INTRAVENOUS

## 2012-01-17 MED ORDER — LIDOCAINE HCL (CARDIAC) 20 MG/ML IV SOLN
INTRAVENOUS | Status: DC | PRN
  Administered 2012-01-17: 100 mg via INTRAVENOUS

## 2012-01-17 MED ORDER — PROPOFOL 10 MG/ML IV EMUL
INTRAVENOUS | Status: DC | PRN
  Administered 2012-01-17: 140 ug/kg/min via INTRAVENOUS

## 2012-01-17 MED ORDER — MIDAZOLAM HCL 5 MG/5ML IJ SOLN
INTRAMUSCULAR | Status: DC | PRN
  Administered 2012-01-17 (×2): 1 mg via INTRAVENOUS

## 2012-01-17 SURGICAL SUPPLY — 21 items

## 2012-01-17 NOTE — H&P (Signed)
  HPI: This is a woman with lower gi symptoms,  Colonoscopy 5-6 months ago with very poor prep    Past Medical History  Diagnosis Date  . Alcohol abuse   . Anxiety   . Arthritis   . Small bowel obstruction 2012  . GERD (gastroesophageal reflux disease)   . Hemorrhoids   . Depression   . Bipolar disorder   . Wolf-Parkinson-White syndrome   . Wolf-Parkinson-White syndrome     per patient  . H/O hiatal hernia     Past Surgical History  Procedure Date  . Tubal ligation   . Esophagogastroduodenoscopy 06/07/2011    Procedure: ESOPHAGOGASTRODUODENOSCOPY (EGD);  Surgeon: Rob Bunting, MD;  Location: [facility];  Service: Endoscopy;  Laterality: N/A;    Current Facility-Administered Medications  Medication Dose Route Frequency Provider Last Rate Last Dose  . lactated ringers infusion   Intravenous Continuous Rachael Fee, MD 20 mL/hr at 01/17/12 0906 1,000 mL at 01/17/12 0906    Allergies as of 01/03/2012 - Review Complete 08/16/2011  Allergen Reaction Noted  . Codeine Nausea Only 06/24/2011    Family History  Problem Relation Age of Onset  . Breast cancer Maternal Aunt   . Colon polyps Maternal Grandmother   . Diabetes Maternal Grandmother   . Heart attack Maternal Grandmother   . Kidney cancer Maternal Grandmother   . Breast cancer Maternal Grandmother   . Colon cancer Neg Hx   . Anesthesia problems Neg Hx   . Hypotension Neg Hx   . Malignant hyperthermia Neg Hx   . Pseudochol deficiency Neg Hx   . Heart disease Father   . Heart disease Paternal Grandfather   . Heart disease Paternal Grandmother     History   Social History  . Marital Status: Single    Spouse Name: N/A    Number of Children: 4  . Years of Education: N/A   Occupational History  . house wife    Social History Main Topics  . Smoking status: Current Everyday Smoker -- 1.0 packs/day    Types: Cigarettes  . Smokeless tobacco: Never Used  . Alcohol Use: No     previous alcohol abuse but  for last 2 weeks 2 drinks. not currently drinking alcohol  . Drug Use: No  . Sexually Active: Yes    Birth Control/ Protection: None   Other Topics Concern  . Not on file   Social History Narrative  . No narrative on file      Physical Exam: BP 120/73  Pulse 65  Temp 97.7 F (36.5 C)  Resp 21  SpO2 97%  LMP 01/10/2012 Constitutional: generally well-appearing Psychiatric: alert and oriented x3 Abdomen: soft, nontender, nondistended, no obvious ascites, no peritoneal signs, normal bowel sounds     Assessment and plan: 41 y.o. female with poorly prepped colon 5-6 months ago, here for repeat   colonoscoyp today with MAC

## 2012-01-17 NOTE — Anesthesia Postprocedure Evaluation (Signed)
  Anesthesia Post-op Note  Patient: [name]  Procedure(s) Performed: Procedure(s) (LRB): COLONOSCOPY WITH PROPOFOL (N/A)  Patient Location: PACU  Anesthesia Type: General  Level of Consciousness: awake and alert   Airway and Oxygen Therapy: Patient Spontanous Breathing  Post-op Pain: mild  Post-op Assessment: Post-op Vital signs reviewed, Patient's Cardiovascular Status Stable, Respiratory Function Stable, Patent Airway and No signs of Nausea or vomiting  Post-op Vital Signs: stable  Complications: No apparent anesthesia complications

## 2012-01-17 NOTE — Anesthesia Preprocedure Evaluation (Signed)
Anesthesia Evaluation  Patient identified by MRN, date of birth, ID band Patient awake    Reviewed: Allergy & Precautions, H&P , NPO status , Patient's Chart, lab work & pertinent test results  Airway Mallampati: II TM Distance: >3 FB Neck ROM: Full    Dental No notable dental hx. (+) Edentulous Upper and Edentulous Lower   Pulmonary neg pulmonary ROS,  breath sounds clear to auscultation  Pulmonary exam normal       Cardiovascular + dysrhythmias Rhythm:Regular Rate:Normal  WPW syndrome   Neuro/Psych Bipolar Disorder negative neurological ROS     GI/Hepatic negative GI ROS, Neg liver ROS, GERD-  Medicated,(+)     substance abuse  alcohol use,   Endo/Other  negative endocrine ROS  Renal/GU negative Renal ROS  negative genitourinary   Musculoskeletal negative musculoskeletal ROS (+)   Abdominal   Peds negative pediatric ROS (+)  Hematology negative hematology ROS (+)   Anesthesia Other Findings   Reproductive/Obstetrics negative OB ROS                           Anesthesia Physical Anesthesia Plan  ASA: III  Anesthesia Plan: MAC   Post-op Pain Management:    Induction: Intravenous  Airway Management Planned: Simple Face Mask  Additional Equipment:   Intra-op Plan:   Post-operative Plan:   Informed Consent: I have reviewed the patients History and Physical, chart, labs and discussed the procedure including the risks, benefits and alternatives for the proposed anesthesia with the patient or authorized representative who has indicated his/her understanding and acceptance.     Plan Discussed with: CRNA  Anesthesia Plan Comments:         Anesthesia Quick Evaluation

## 2012-01-17 NOTE — Transfer of Care (Signed)
Immediate Anesthesia Transfer of Care Note  Patient: [name]  Procedure(s) Performed: Procedure(s) (LRB): COLONOSCOPY WITH PROPOFOL (N/A)  Patient Location: PACU  Anesthesia Type: MAC  Level of Consciousness: awake, alert  and oriented  Airway & Oxygen Therapy: Patient Spontanous Breathing and Patient connected to face mask oxygen  Post-op Assessment: Report given to PACU RN and Post -op Vital signs reviewed and stable  Post vital signs: Reviewed and stable  Complications: No apparent anesthesia complications

## 2012-01-17 NOTE — Addendum Note (Signed)
Addendum  created 01/17/12 1017 by Patrisia Faeth L Deaira Leckey, CRNA   Modules edited:Anesthesia Flowsheet    

## 2012-01-17 NOTE — Discharge Instructions (Signed)

## 2012-01-17 NOTE — Op Note (Signed)
[facility] [address] [place], [place]  [address]  COLONOSCOPY PROCEDURE REPORT  PATIENT:  [name], [name]  MR#:  [id number] BIRTHDATE:  [birth year], 40 yrs. old  GENDER:  female ENDOSCOPIST:  Rachael Fee, MD PROCEDURE DATE:  01/17/2012 PROCEDURE:  Colonoscopy 19147 ASA CLASS:  Class III INDICATIONS:  intermittent constipation, intermittent abd pains, incomplete colonsocopy 6 months ago due to very poor prep MEDICATIONS:   MAC sedation, administered by CRNA  DESCRIPTION OF PROCEDURE:   After the risks benefits and alternatives of the procedure were thoroughly explained, informed consent was obtained.  Digital rectal exam was performed and revealed no rectal masses.   The Pentax Colonoscope [id number] endoscope was introduced through the anus and advanced to the cecum, which was identified by both the appendix and ileocecal valve, without limitations.  The quality of the prep was good.. The instrument was then slowly withdrawn as the colon was fully examined.<<PROCEDUREIMAGES>> FINDINGS:  A normal appearing cecum, ileocecal valve, and appendiceal orifice were identified. The ascending, hepatic flexure, transverse, splenic flexure, descending, sigmoid colon, and rectum appeared unremarkable (see image001, image002, and image003).   Retroflexed views in the rectum revealed no abnormalities. COMPLICATIONS:  None  ENDOSCOPIC IMPRESSION: 1) Normal colon 2) No polyps or cancers  RECOMMENDATIONS: 1) You should continue to follow colorectal cancer screening guidelines for "routine risk" patients with a repeat colonoscopy in 10 years. There is no need for FOBT (stool) testing for at least 5 years.  REPEAT EXAM:  10 years  ______________________________ Rachael Fee, MD  cc: Marva Panda, MD  n. Rosalie Doctor:   Rachael Fee at 01/17/2012 09:51 AM  Franne Forts, [phone]

## 2012-01-17 NOTE — Addendum Note (Signed)
Addendum  created 01/17/12 1017 by Florene Route, CRNA   Modules edited:Anesthesia Flowsheet

## 2012-04-23 ENCOUNTER — Emergency Department (INDEPENDENT_AMBULATORY_CARE_PROVIDER_SITE_OTHER)
Admission: EM | Admit: 2012-04-23 | Discharge: 2012-04-23 | Disposition: A | Discharge status: Home / Self Care | Payer: Medicaid Other | Source: Home / Self Care

## 2012-04-23 ENCOUNTER — Encounter (HOSPITAL_COMMUNITY): Payer: Self-pay | Admitting: Emergency Medicine

## 2012-04-23 DIAGNOSIS — J069 Acute upper respiratory infection, unspecified: Secondary | ICD-10-CM

## 2012-04-23 DIAGNOSIS — B356 Tinea cruris: Secondary | ICD-10-CM

## 2012-04-23 MED ORDER — FLUCONAZOLE 150 MG PO TABS: 150.0000 mg | ORAL_TABLET | Freq: Once | ORAL | Status: DC

## 2012-04-23 MED ORDER — TRIAMCINOLONE ACETONIDE 0.025 % EX OINT: TOPICAL_OINTMENT | Freq: Two times a day (BID) | CUTANEOUS | Status: DC

## 2012-04-23 MED ORDER — PSEUDOEPHEDRINE HCL 60 MG PO TABS: 60.0000 mg | ORAL_TABLET | Freq: Four times a day (QID) | ORAL | Status: DC | PRN

## 2012-04-23 MED ORDER — MELOXICAM 7.5 MG PO TABS: 7.5000 mg | ORAL_TABLET | Freq: Every day | ORAL | Status: DC

## 2012-04-23 MED ORDER — CLOTRIMAZOLE 1 % EX CREA: TOPICAL_CREAM | Freq: Two times a day (BID) | CUTANEOUS | Status: DC

## 2012-04-23 NOTE — ED Notes (Signed)
Pt c/o productive cough with green sputum and chest congestion x 1 wk. Pt states that she is having hot and cold chills. Denies any other symptoms. Otc meds not working.  Pt also c/o of rash in groin area that has spread to buttocks area. Pt is also experiencing LUQ pain, pt states she has hx of gastrointestinal problems.

## 2012-04-23 NOTE — ED Provider Notes (Signed)
History     CSN: [id number]  Arrival date & time 04/23/12  1541   None     Chief Complaint  Patient presents with  . Cough    cough with chest congestion    (Consider location/radiation/quality/duration/timing/severity/associated sxs/prior treatment) HPI CC: cough, abdominal pain, rash  Cough: present for past week. Multiple sick contacts in house. Keeps awake at night. Nyquil w/o relief. Rhinorrhea during same time. Denies fever, n/v/d/c. Smoker for several years.   Abdominal pain: Chronic issue for patient current episode for past 2wks. No precipitating event. Pain is constant and ache in nature. Worse w/ cough. No alleviating factors. Daily BMs that are soft. 4 Advil w/o relief. Recent GI workup including upper endoscopy and colonoscopy which shoed small hiatal hernia. No h/o autoimmune disorder. Denies fever, night sweats, hematochezia, hematemesis. Pt is through menopause   Rash: present for 3 weeks in groin region. Went to [place] just over 3 weeks ago. No one else w/ similar infection. Puritic. Not relieved w/ calamine lotion and lotrimin cream. Spreading. Has had ring worm before and successfully treated   PMHX significant for GERD, and hiatal hernia, h/o WPW.  FMHx: Eczema in brother Social hx ETOH abuse in past, current smoker,     Past Medical History  Diagnosis Date  . Alcohol abuse   . Anxiety   . Arthritis   . Small bowel obstruction 2012  . GERD (gastroesophageal reflux disease)   . Hemorrhoids   . Depression   . Bipolar disorder   . Wolf-Parkinson-White syndrome   . Wolf-Parkinson-White syndrome     per patient  . H/O hiatal hernia     Past Surgical History  Procedure Date  . Tubal ligation   . Esophagogastroduodenoscopy 06/07/2011    Procedure: ESOPHAGOGASTRODUODENOSCOPY (EGD);  Surgeon: Rob Bunting, MD;  Location: [facility];  Service: Endoscopy;  Laterality: N/A;    Family History  Problem Relation Age of Onset  . Breast cancer  Maternal Aunt   . Colon polyps Maternal Grandmother   . Diabetes Maternal Grandmother   . Heart attack Maternal Grandmother   . Kidney cancer Maternal Grandmother   . Breast cancer Maternal Grandmother   . Colon cancer Neg Hx   . Anesthesia problems Neg Hx   . Hypotension Neg Hx   . Malignant hyperthermia Neg Hx   . Pseudochol deficiency Neg Hx   . Heart disease Father   . Heart disease Paternal Grandfather   . Heart disease Paternal Grandmother     History  Substance Use Topics  . Smoking status: Current Every Day Smoker -- 1.0 packs/day    Types: Cigarettes  . Smokeless tobacco: Never Used  . Alcohol Use: No     previous alcohol abuse but for last 2 weeks 2 drinks. not currently drinking alcohol    OB History    Grav Para Term Preterm Abortions TAB SAB Ect Mult Living                  Review of Systems Per hpi Allergies  Codeine  Home Medications   Current Outpatient Rx  Name Route Sig Dispense Refill  . CLONAZEPAM 2 MG PO TABS Oral Take 2 mg by mouth at bedtime.    Marland Kitchen DIVALPROEX SODIUM ER 500 MG PO TB24 Oral Take 1,500 mg by mouth daily.     Marland Kitchen ESCITALOPRAM OXALATE 20 MG PO TABS Oral Take 20 mg by mouth daily.      Marland Kitchen FLUCONAZOLE 150 MG PO TABS  Oral Take 1 tablet (150 mg total) by mouth once. Take one tablet weekly for 4 weeks 4 tablet 0  . MELOXICAM 7.5 MG PO TABS Oral Take 1 tablet (7.5 mg total) by mouth daily. 14 tablet 0  . MOVIPREP 100 G PO SOLR  moviprep as directed// no substitutions 1 kit 0    Dispense as written.  Marland Kitchen OMEPRAZOLE 40 MG PO CPDR Oral Take 1 capsule (40 mg total) by mouth daily. 30 capsule 11  . PSEUDOEPHEDRINE HCL 60 MG PO TABS Oral Take 1 tablet (60 mg total) by mouth every 6 (six) hours as needed for congestion. 30 tablet 0  . TRIAMCINOLONE ACETONIDE 0.025 % EX OINT Topical Apply topically 2 (two) times daily. Treat until clear or for a maximum of 5-7 days 30 g 0    BP 125/97  Pulse 82  Temp 98.3 F (36.8 C) (Oral)  Resp 19  SpO2 98%   LMP 04/16/2012  Physical Exam  Gen: NAD, Obese HEENT: copious upper airway secretions ABD: NABS, nonpainful to palpation in all 4 quadrants. No mass/organomegaly Ext: no edema Skin: large dry rash w/ erythematous border in circular type fashion in groin area especially where skin touches on adjoining skin region of other extremity.   ED Course  Procedures (including critical care time)  Labs Reviewed - No data to display No results found.   No diagnosis found.    MDM  41yo f/ w/ chronic abdominal pain complaints, viral URI, and likely tinea corporis.  - Failed lotrimin so will do po Fluconazole - Kenelog cream for itch - Pt counseled to have PCP or derm perform punch biopsy of lesion to path as this may be cutaneous manifestation of autoimmune type disorder as did not resolve w/ lotrimin. - pseudophed for cough - meloxicam for abd pain - miralax and gas-x prn - f/u pcp - handouts given       Ozella Rocks, MD 04/23/12 1843

## 2012-04-24 NOTE — ED Provider Notes (Signed)
Medical screening examination/treatment/procedure(s) were performed by Resident-physician practitioner and as supervising physician I was immediately available for consultation/collaboration.Have examined patient myself, and discussed and agreed with treatment plan.   Raynald Blend, MD 04/24/12 0900

## 2012-10-30 ENCOUNTER — Encounter (HOSPITAL_COMMUNITY): Payer: Self-pay | Admitting: Neurology

## 2012-10-30 ENCOUNTER — Emergency Department (HOSPITAL_COMMUNITY)
Admission: EM | Admit: 2012-10-30 | Discharge: 2012-10-30 | Disposition: A | Discharge status: Home / Self Care | Payer: Medicaid Other | Attending: Emergency Medicine | Admitting: Emergency Medicine

## 2012-10-30 DIAGNOSIS — M25519 Pain in unspecified shoulder: Secondary | ICD-10-CM | POA: Insufficient documentation

## 2012-10-30 DIAGNOSIS — M25579 Pain in unspecified ankle and joints of unspecified foot: Secondary | ICD-10-CM | POA: Insufficient documentation

## 2012-10-30 DIAGNOSIS — K219 Gastro-esophageal reflux disease without esophagitis: Secondary | ICD-10-CM | POA: Insufficient documentation

## 2012-10-30 DIAGNOSIS — M25539 Pain in unspecified wrist: Secondary | ICD-10-CM | POA: Insufficient documentation

## 2012-10-30 DIAGNOSIS — M25529 Pain in unspecified elbow: Secondary | ICD-10-CM | POA: Insufficient documentation

## 2012-10-30 DIAGNOSIS — M25549 Pain in joints of unspecified hand: Secondary | ICD-10-CM | POA: Insufficient documentation

## 2012-10-30 DIAGNOSIS — F411 Generalized anxiety disorder: Secondary | ICD-10-CM | POA: Insufficient documentation

## 2012-10-30 DIAGNOSIS — Z8719 Personal history of other diseases of the digestive system: Secondary | ICD-10-CM | POA: Insufficient documentation

## 2012-10-30 DIAGNOSIS — Z79899 Other long term (current) drug therapy: Secondary | ICD-10-CM | POA: Insufficient documentation

## 2012-10-30 DIAGNOSIS — M25569 Pain in unspecified knee: Secondary | ICD-10-CM | POA: Insufficient documentation

## 2012-10-30 DIAGNOSIS — M2550 Pain in unspecified joint: Secondary | ICD-10-CM

## 2012-10-30 DIAGNOSIS — F319 Bipolar disorder, unspecified: Secondary | ICD-10-CM | POA: Insufficient documentation

## 2012-10-30 DIAGNOSIS — Z8679 Personal history of other diseases of the circulatory system: Secondary | ICD-10-CM | POA: Insufficient documentation

## 2012-10-30 MED ORDER — OXYCODONE HCL 10 MG PO TABS: 10.0000 mg | ORAL_TABLET | Freq: Three times a day (TID) | ORAL | Status: DC

## 2012-10-30 NOTE — ED Notes (Signed)
Pt stating here due to joint pain all over, pain and swelling x 1 year. Pt a x 4.

## 2012-10-30 NOTE — ED Provider Notes (Signed)
History     CSN: [id number]  Arrival date & time 10/30/12  4098   First MD Initiated Contact with Patient 10/30/12 [phone]      Chief Complaint  Patient presents with  . Joint Pain    (Consider location/radiation/quality/duration/timing/severity/associated sxs/prior treatment) HPI Comments: Patient presenting with joint pain.  She reports that she has been having pain of all joints of the hands, bilateral wrist pain, bilateral elbow pain, bilateral shoulder pain, bilateral knee pain, and bilateral ankle pain.  Pain has been present for the past 2 years.  She reports that the pain is constant, but worsens at times.  She had been taken Oxycodone for pain, which has helped but she ran out of the medications.  She states that her joints have been intermittently erythematous, swollen, and warm, but are not at this time.   She has seen her PCP for this pain several times.  She states that her PCP has tried several different NSAIDS, which have been ineffective.  She reports that her PCP has been unable to figure out the cause of the pain.  According to the patient her PCP felt that the pain was either due to Fibromyalgia or RA.  Her PCP was apparently set her up with an appointment with a Rheumatologist at [facility].  Patient denies any fever or chills.  She has full ROM of all joints at this time.  The history is provided by the patient.    Past Medical History  Diagnosis Date  . Alcohol abuse   . Anxiety   . Arthritis   . Small bowel obstruction 2012  . GERD (gastroesophageal reflux disease)   . Hemorrhoids   . Depression   . Bipolar disorder   . Wolf-Parkinson-White syndrome   . Wolf-Parkinson-White syndrome     per patient  . H/O hiatal hernia     Past Surgical History  Procedure Laterality Date  . Tubal ligation    . Esophagogastroduodenoscopy  06/07/2011    Procedure: ESOPHAGOGASTRODUODENOSCOPY (EGD);  Surgeon: Rob Bunting, MD;  Location: [facility];  Service:  Endoscopy;  Laterality: N/A;    Family History  Problem Relation Age of Onset  . Breast cancer Maternal Aunt   . Colon polyps Maternal Grandmother   . Diabetes Maternal Grandmother   . Heart attack Maternal Grandmother   . Kidney cancer Maternal Grandmother   . Breast cancer Maternal Grandmother   . Colon cancer Neg Hx   . Anesthesia problems Neg Hx   . Hypotension Neg Hx   . Malignant hyperthermia Neg Hx   . Pseudochol deficiency Neg Hx   . Heart disease Father   . Heart disease Paternal Grandfather   . Heart disease Paternal Grandmother     History  Substance Use Topics  . Smoking status: Current Every Day Smoker -- 1.00 packs/day    Types: Cigarettes  . Smokeless tobacco: Never Used  . Alcohol Use: No     Comment: previous alcohol abuse but for last 2 weeks 2 drinks. not currently drinking alcohol    OB History   Grav Para Term Preterm Abortions TAB SAB Ect Mult Living                  Review of Systems  Constitutional: Negative for fever and chills.  Musculoskeletal: Positive for joint swelling and arthralgias. Negative for back pain and gait problem.  Skin: Negative for color change.    Allergies  Codeine  Home Medications   Current Outpatient  Rx  Name  Route  Sig  Dispense  Refill  . albuterol (PROVENTIL HFA;VENTOLIN HFA) 108 (90 BASE) MCG/ACT inhaler   Inhalation   Inhale 2 puffs into the lungs every 6 (six) hours as needed for shortness of breath (panic attacks).         Marland Kitchen amphetamine-dextroamphetamine (ADDERALL) 20 MG tablet   Oral   Take 20 mg by mouth 2 (two) times daily.         . clonazePAM (KLONOPIN) 0.5 MG tablet   Oral   Take 0.5 mg by mouth 3 (three) times daily as needed for anxiety.         Marland Kitchen esomeprazole (NEXIUM) 20 MG capsule   Oral   Take 20 mg by mouth daily before breakfast.         . zolpidem (AMBIEN) 10 MG tablet   Oral   Take 10-15 mg by mouth at bedtime.           BP 115/88  Pulse 104  Temp(Src) 96.8 F (36  C) (Oral)  Resp 18  SpO2 98%  LMP 10/23/2012  Repeat temperature 97.8 F Physical Exam  Nursing note and vitals reviewed. Constitutional: She appears well-developed and well-nourished. No distress.  HENT:  Head: Normocephalic and atraumatic.  Neck: Normal range of motion. Neck supple.  Cardiovascular: Normal rate, regular rhythm and normal heart sounds.   Pulmonary/Chest: Effort normal and breath sounds normal.  Musculoskeletal: Normal range of motion. She exhibits tenderness. She exhibits no edema.  Normal ROM of all joints.  No erythema, edema, or warmth of any joints.  Neurological: She is alert.  Skin: Skin is warm and dry. She is not diaphoretic. No erythema.  Psychiatric: She has a normal mood and affect.    ED Course  Procedures (including critical care time)  Labs Reviewed - No data to display No results found.   No diagnosis found.    MDM  Patient presenting with joint pain of pretty much every joint of her body.  She has full ROM of all joints.  No erythema, edema, or warmth of joints.  Patient is afebrile.  She has seen her PCP for this several times and has an appointment with Rheumatology at [facility].  Do not feel that further work up is needed at this time.  Patient given short course of pain medication and discharged home.        Pascal Lux [place], PA-C 10/31/12 1233

## 2012-10-31 NOTE — ED Provider Notes (Signed)
Medical screening examination/treatment/procedure(s) were performed by non-physician practitioner and as supervising physician I was immediately available for consultation/collaboration.  Baileigh Modisette R. Gary Bultman, MD 10/31/12 1549 

## 2013-01-28 IMAGING — CR DG ABDOMEN ACUTE W/ 1V CHEST
4 series · 4 of 4 positions shown · non-contrast
Comparison: None.

CLINICAL DATA: Left upper abdominal pain, nausea

ACUTE ABDOMEN SERIES (ABDOMEN 2 VIEW & CHEST 1 VIEW)

[w chest pa]
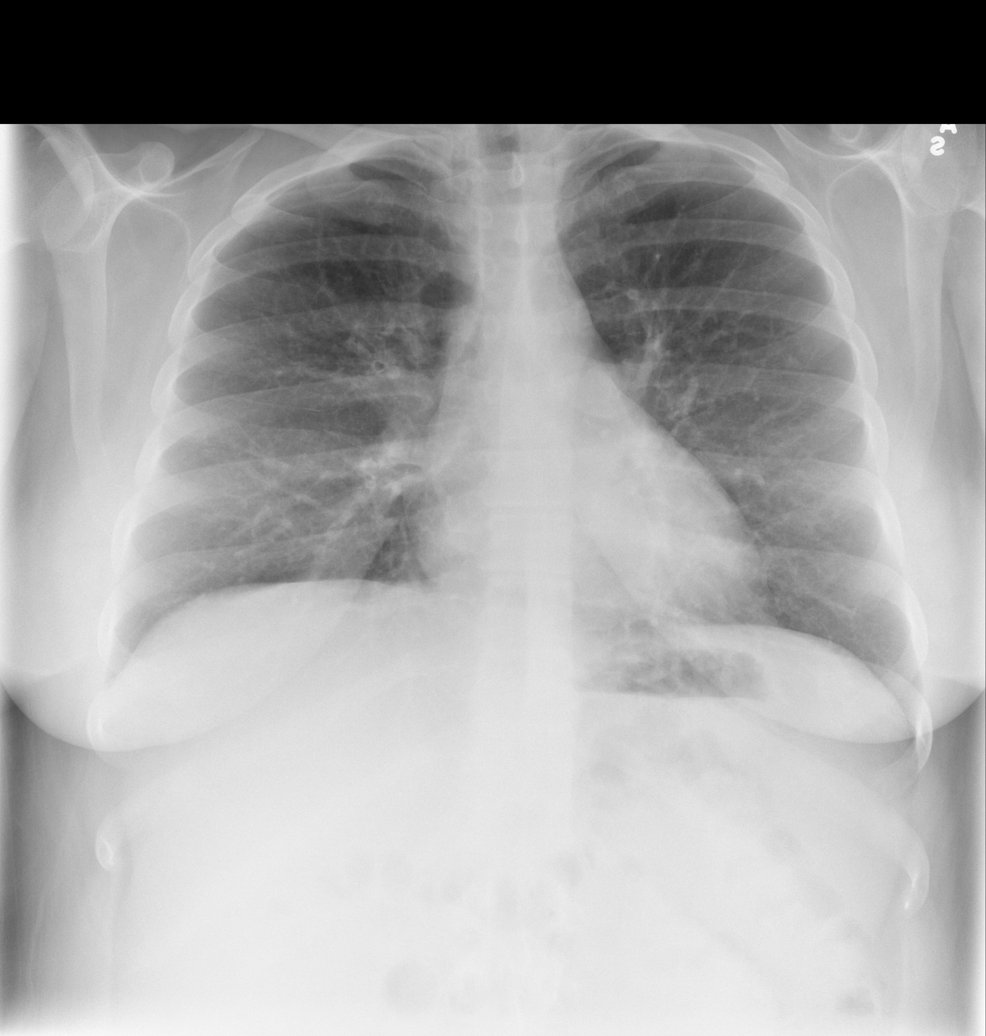

[w abdomen upright *]
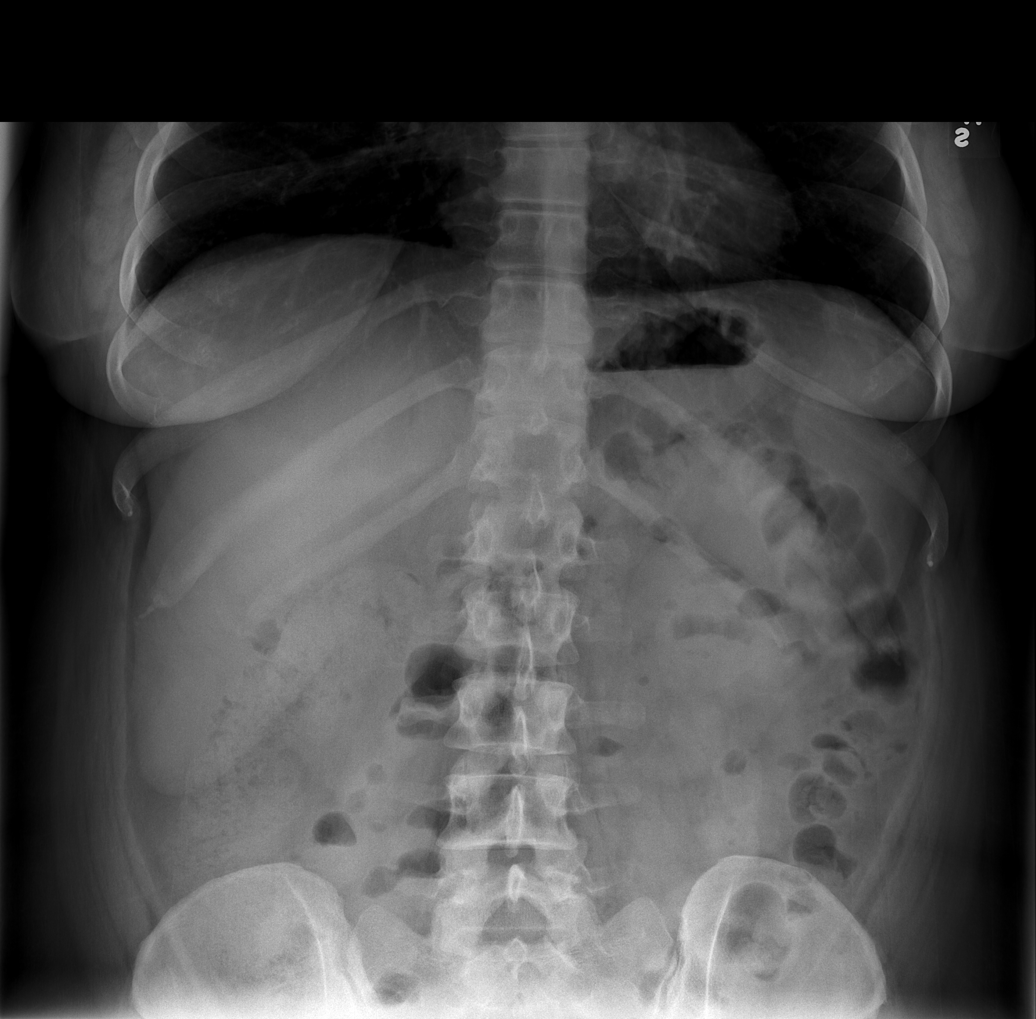

[t abdomen supine (1 of 2)]
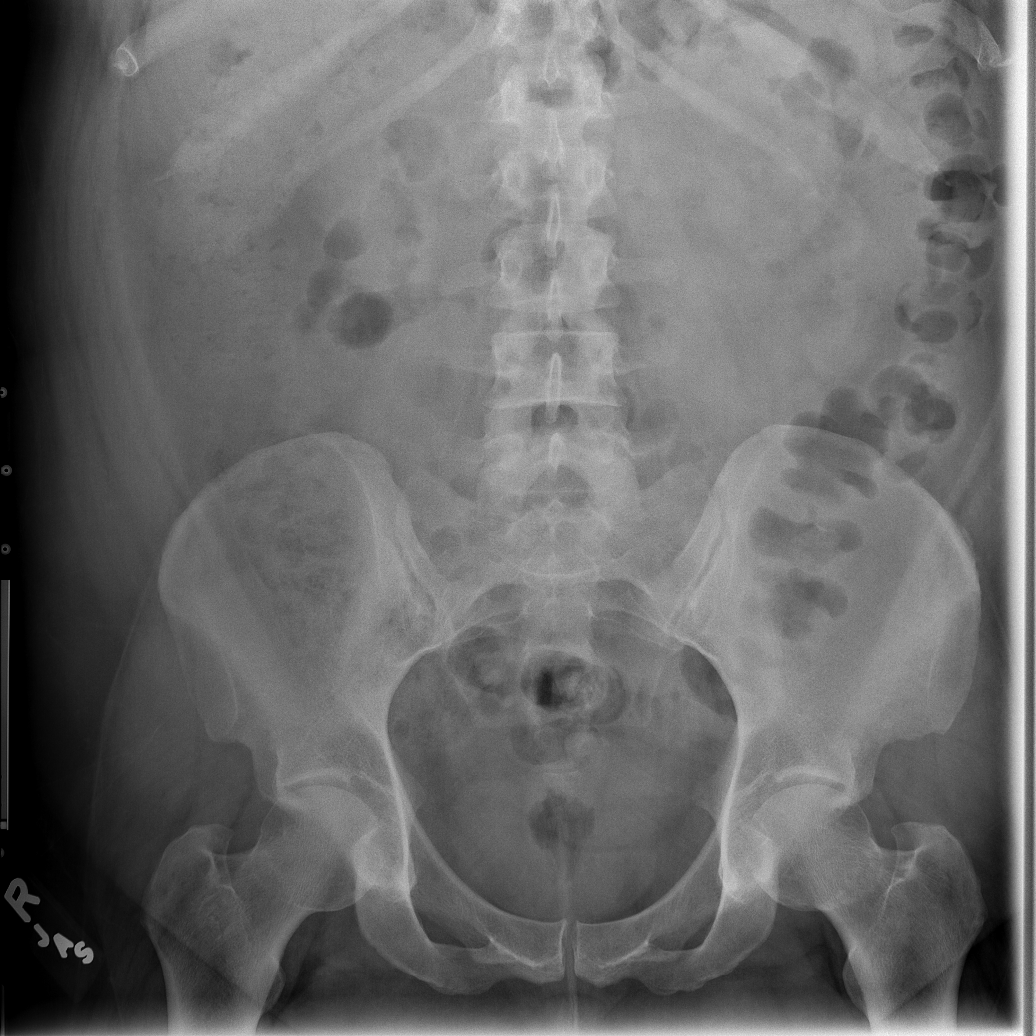

[t abdomen supine (2 of 2)]
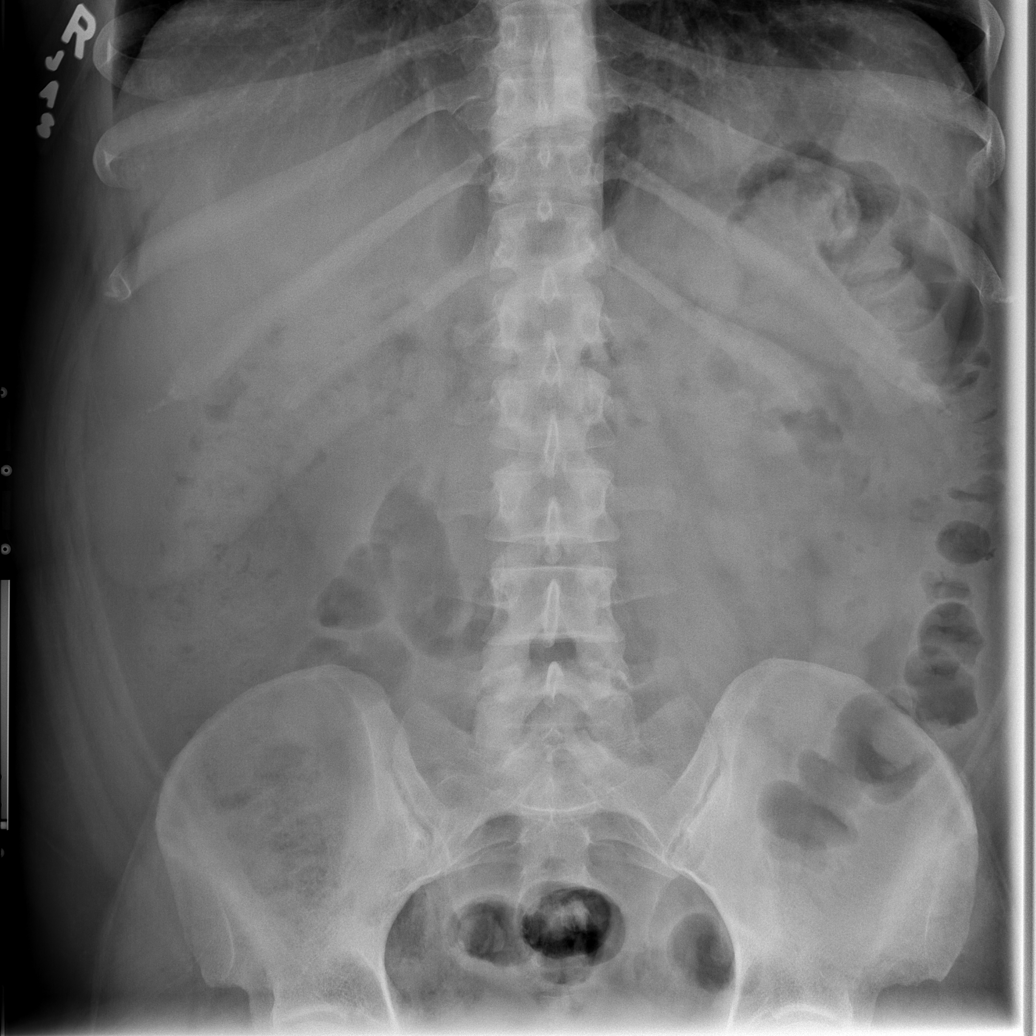

[4 of 4 positions shown; findings below may reference images not displayed]

FINDINGS: Low lung volumes.  Heart size normal.  No effusion.  No
free air.  Normal bowel gas pattern.  No abnormal abdominal
calcification.  Regional bones unremarkable.
IMPRESSION: 1.  Normal bowel gas pattern.
2.  No free air.
3.  Low volumes with no evidence of acute cardiopulmonary disease.

## 2013-03-14 ENCOUNTER — Emergency Department (HOSPITAL_COMMUNITY)
Admission: EM | Admit: 2013-03-14 | Discharge: 2013-03-14 | Disposition: A | Discharge status: Home / Self Care | Payer: Medicaid Other | Attending: Emergency Medicine | Admitting: Emergency Medicine

## 2013-03-14 ENCOUNTER — Encounter (HOSPITAL_COMMUNITY): Payer: Self-pay | Admitting: Emergency Medicine

## 2013-03-14 ENCOUNTER — Telehealth (HOSPITAL_COMMUNITY): Payer: Self-pay | Admitting: Emergency Medicine

## 2013-03-14 DIAGNOSIS — F1021 Alcohol dependence, in remission: Secondary | ICD-10-CM | POA: Insufficient documentation

## 2013-03-14 DIAGNOSIS — Z79899 Other long term (current) drug therapy: Secondary | ICD-10-CM | POA: Insufficient documentation

## 2013-03-14 DIAGNOSIS — M2550 Pain in unspecified joint: Secondary | ICD-10-CM | POA: Insufficient documentation

## 2013-03-14 DIAGNOSIS — F411 Generalized anxiety disorder: Secondary | ICD-10-CM | POA: Insufficient documentation

## 2013-03-14 DIAGNOSIS — K219 Gastro-esophageal reflux disease without esophagitis: Secondary | ICD-10-CM | POA: Insufficient documentation

## 2013-03-14 DIAGNOSIS — Z8719 Personal history of other diseases of the digestive system: Secondary | ICD-10-CM | POA: Insufficient documentation

## 2013-03-14 DIAGNOSIS — F319 Bipolar disorder, unspecified: Secondary | ICD-10-CM | POA: Insufficient documentation

## 2013-03-14 DIAGNOSIS — F17200 Nicotine dependence, unspecified, uncomplicated: Secondary | ICD-10-CM | POA: Insufficient documentation

## 2013-03-14 DIAGNOSIS — M069 Rheumatoid arthritis, unspecified: Secondary | ICD-10-CM | POA: Insufficient documentation

## 2013-03-14 DIAGNOSIS — Z8679 Personal history of other diseases of the circulatory system: Secondary | ICD-10-CM | POA: Insufficient documentation

## 2013-03-14 MED ORDER — METHYLPREDNISOLONE SODIUM SUCC 125 MG IJ SOLR
125.0000 mg | Freq: Once | INTRAMUSCULAR | Status: AC
  Administered 2013-03-14: 125 mg via INTRAMUSCULAR
  Filled 2013-03-14: qty 2

## 2013-03-14 MED ORDER — KETOROLAC TROMETHAMINE 60 MG/2ML IM SOLN
60.0000 mg | Freq: Once | INTRAMUSCULAR | Status: AC
  Administered 2013-03-14: 60 mg via INTRAMUSCULAR
  Filled 2013-03-14: qty 2

## 2013-03-14 MED ORDER — OXYCODONE HCL 5 MG PO TABS: 5.0000 mg | ORAL_TABLET | ORAL | Status: DC | PRN

## 2013-03-14 MED ORDER — PREDNISONE 10 MG PO TABS: 20.0000 mg | ORAL_TABLET | Freq: Every day | ORAL | Status: DC

## 2013-03-14 NOTE — ED Notes (Signed)
Pt alert, arrives from home, c/o pain to joints, "bumps" to ext, onset was several months ago, worse over past week, resp even unlabored

## 2013-03-14 NOTE — ED Provider Notes (Signed)
CSN: [id number]     Arrival date & time 03/14/13  1332 History     First MD Initiated Contact with Patient 03/14/13 1422     Chief Complaint  Patient presents with  . Joint Pain   (Consider location/radiation/quality/duration/timing/severity/associated sxs/prior Treatment) The history is provided by the patient and medical records.   Patient presents to the ED for diffuse joint pains and painful "bumps" on her extremities. No recent injury, trauma or falls. Patient states the symptoms have been present for the past several months.  She has been referred to rheumatology for further evaluation of her rheumatoid arthritis but appointment is not until 04/14/2013. States her primary care physician recently changed her pain medication to extended-release oxycodone which she states "does not work like the regular oxycodone."  No other meds taken PTA.  Past Medical History  Diagnosis Date  . Alcohol abuse   . Anxiety   . Arthritis   . Small bowel obstruction 2012  . GERD (gastroesophageal reflux disease)   . Hemorrhoids   . Depression   . Bipolar disorder   . Wolf-Parkinson-White syndrome   . Wolf-Parkinson-White syndrome     per patient  . H/O hiatal hernia    Past Surgical History  Procedure Laterality Date  . Tubal ligation    . Esophagogastroduodenoscopy  06/07/2011    Procedure: ESOPHAGOGASTRODUODENOSCOPY (EGD);  Surgeon: Rob Bunting, MD;  Location: [facility];  Service: Endoscopy;  Laterality: N/A;   Family History  Problem Relation Age of Onset  . Breast cancer Maternal Aunt   . Colon polyps Maternal Grandmother   . Diabetes Maternal Grandmother   . Heart attack Maternal Grandmother   . Kidney cancer Maternal Grandmother   . Breast cancer Maternal Grandmother   . Colon cancer Neg Hx   . Anesthesia problems Neg Hx   . Hypotension Neg Hx   . Malignant hyperthermia Neg Hx   . Pseudochol deficiency Neg Hx   . Heart disease Father   . Heart disease Paternal Grandfather    . Heart disease Paternal Grandmother    History  Substance Use Topics  . Smoking status: Current Every Day Smoker -- 1.00 packs/day    Types: Cigarettes  . Smokeless tobacco: Never Used  . Alcohol Use: No     Comment: previous alcohol abuse but for last 2 weeks 2 drinks. not currently drinking alcohol   OB History   Grav Para Term Preterm Abortions TAB SAB Ect Mult Living                 Review of Systems  Musculoskeletal: Positive for arthralgias.  All other systems reviewed and are negative.    Allergies  Codeine  Home Medications   Current Outpatient Rx  Name  Route  Sig  Dispense  Refill  . albuterol (PROVENTIL HFA;VENTOLIN HFA) 108 (90 BASE) MCG/ACT inhaler   Inhalation   Inhale 2 puffs into the lungs every 6 (six) hours as needed for shortness of breath (panic attacks).         Marland Kitchen amphetamine-dextroamphetamine (ADDERALL) 10 MG tablet   Oral   Take 10 mg by mouth daily with lunch.         . amphetamine-dextroamphetamine (ADDERALL) 30 MG tablet   Oral   Take 30 mg by mouth 2 (two) times daily.         . clonazePAM (KLONOPIN) 1 MG tablet   Oral   Take 1 mg by mouth 3 (three) times daily as needed for anxiety.         Marland Kitchen  esomeprazole (NEXIUM) 20 MG capsule   Oral   Take 20 mg by mouth daily before breakfast.         . oxyCODONE (OXYCONTIN) 10 MG 12 hr tablet   Oral   Take 10 mg by mouth every 12 (twelve) hours.         . promethazine (PHENERGAN) 25 MG tablet   Oral   Take 25 mg by mouth every 6 (six) hours as needed for nausea.         Marland Kitchen zolpidem (AMBIEN) 10 MG tablet   Oral   Take 15 mg by mouth at bedtime.           BP 108/83  Pulse 91  Temp(Src) 98 F (36.7 C) (Oral)  Resp 16  Wt 188 lb (85.276 kg)  BMI 30.36 kg/m2  SpO2 98%  LMP 03/07/2013  Physical Exam  Nursing note and vitals reviewed. Constitutional: She is oriented to person, place, and time. She appears well-developed and well-nourished.  HENT:  Head: Normocephalic  and atraumatic.  Eyes: Conjunctivae and EOM are normal. Pupils are equal, round, and reactive to light.  Neck: Normal range of motion. Neck supple.  Cardiovascular: Normal rate, regular rhythm and normal heart sounds.   Pulmonary/Chest: Effort normal and breath sounds normal.  Musculoskeletal: Normal range of motion.  Diffuse joint pain and swelling, worse in the hands at MCP and PIP joints, several rheumatoid nodules on BUE and BLE  Neurological: She is alert and oriented to person, place, and time.  Skin: Skin is warm and dry.  Psychiatric: She has a normal mood and affect.    ED Course   Procedures (including critical care time)  Labs Reviewed - No data to display No results found.  1. Joint pain     MDM   Symptoms suspicious for rheumatoid arthritis flare.  Patient will followup with her rheumatologist previously scheduled appointment. Rx oxycodone and prednisone. She will followup with her primary care physician if problems occur before appointment. Discussed plan with patient, she agreed. Return precautions advised.  4:56 PM Received call from pharmacy that pt is complaining that i only wrote for oxycodone 5 instead of oxycodone 10. I discussed with pharmacist that pt complained to me that PCP changed her pain meds to oxycodone ER and "they dont work like the regular oxycodone" and so i wrote a new prescription.  Pharmacist informed that pt has been on oxycodone 10 ER for several months now and this is not a new change.  Prescription cancelled.  Garlon Hatchet, PA-C 03/14/13 1618  Garlon Hatchet, PA-C 03/14/13 1700

## 2013-03-15 NOTE — ED Provider Notes (Signed)
Medical screening examination/treatment/procedure(s) were performed by non-physician practitioner and as supervising physician I was immediately available for consultation/collaboration.   Istvan Behar E Daemon Dowty, MD 03/15/13 1311 

## 2013-06-18 ENCOUNTER — Encounter (HOSPITAL_COMMUNITY): Payer: Self-pay | Admitting: Emergency Medicine

## 2013-06-18 ENCOUNTER — Emergency Department (HOSPITAL_COMMUNITY)
Admission: EM | Admit: 2013-06-18 | Discharge: 2013-06-19 | Disposition: A | Discharge status: Home / Self Care | Payer: Medicaid Other | Attending: Emergency Medicine | Admitting: Emergency Medicine

## 2013-06-18 DIAGNOSIS — F319 Bipolar disorder, unspecified: Secondary | ICD-10-CM | POA: Insufficient documentation

## 2013-06-18 DIAGNOSIS — F1910 Other psychoactive substance abuse, uncomplicated: Secondary | ICD-10-CM

## 2013-06-18 DIAGNOSIS — K219 Gastro-esophageal reflux disease without esophagitis: Secondary | ICD-10-CM | POA: Insufficient documentation

## 2013-06-18 DIAGNOSIS — Z3202 Encounter for pregnancy test, result negative: Secondary | ICD-10-CM | POA: Insufficient documentation

## 2013-06-18 DIAGNOSIS — F411 Generalized anxiety disorder: Secondary | ICD-10-CM | POA: Insufficient documentation

## 2013-06-18 DIAGNOSIS — F1410 Cocaine abuse, uncomplicated: Secondary | ICD-10-CM

## 2013-06-18 DIAGNOSIS — Z79899 Other long term (current) drug therapy: Secondary | ICD-10-CM | POA: Insufficient documentation

## 2013-06-18 DIAGNOSIS — Z8679 Personal history of other diseases of the circulatory system: Secondary | ICD-10-CM | POA: Insufficient documentation

## 2013-06-18 DIAGNOSIS — F1010 Alcohol abuse, uncomplicated: Secondary | ICD-10-CM | POA: Insufficient documentation

## 2013-06-18 DIAGNOSIS — F17200 Nicotine dependence, unspecified, uncomplicated: Secondary | ICD-10-CM | POA: Insufficient documentation

## 2013-06-18 DIAGNOSIS — Z8739 Personal history of other diseases of the musculoskeletal system and connective tissue: Secondary | ICD-10-CM | POA: Insufficient documentation

## 2013-06-18 LAB — COMPREHENSIVE METABOLIC PANEL
Albumin: 3.6 g/dL (ref 3.5–5.2)
Alkaline Phosphatase: 96 U/L (ref 39–117)
BUN: 7 mg/dL (ref 6–23)
Creatinine, Ser: 0.83 mg/dL (ref 0.50–1.10)
GFR calc Af Amer: >90 mL/min (ref >90)
Glucose, Bld: 95 mg/dL (ref 70–99)
Potassium: 3 mEq/L — ABNORMAL LOW (ref 3.5–5.1)
Total Bilirubin: 0.6 mg/dL (ref 0.3–1.2)
Total Protein: 7.2 g/dL (ref 6.0–8.3)

## 2013-06-18 LAB — CBC
HCT: 34.8 % — ABNORMAL LOW (ref 36.0–46.0)
Hemoglobin: 11.8 g/dL — ABNORMAL LOW (ref 12.0–15.0)
MCHC: 33.9 g/dL (ref 30.0–36.0)
MCV: 89 fL (ref 78.0–100.0)
RDW: 13.4 % (ref 11.5–15.5)

## 2013-06-18 LAB — POCT PREGNANCY, URINE: Preg Test, Ur: NEGATIVE

## 2013-06-18 LAB — ETHANOL: Alcohol, Ethyl (B): <11 mg/dL (ref 0–11)

## 2013-06-18 LAB — RAPID URINE DRUG SCREEN, HOSP PERFORMED: Opiates: NOT DETECTED
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
## 2013-06-19 ENCOUNTER — Inpatient Hospital Stay (HOSPITAL_COMMUNITY): Admission: AD | Admit: 2013-06-19 | Payer: Medicaid Other | Admitting: Psychiatry

## 2013-06-19 DIAGNOSIS — F1010 Alcohol abuse, uncomplicated: Secondary | ICD-10-CM

## 2013-06-19 DIAGNOSIS — F3189 Other bipolar disorder: Secondary | ICD-10-CM

## 2013-06-19 MED ORDER — LORAZEPAM 1 MG PO TABS: 0.0000 mg | ORAL_TABLET | Freq: Two times a day (BID) | ORAL | Status: DC

## 2013-06-19 MED ORDER — ALBUTEROL SULFATE HFA 108 (90 BASE) MCG/ACT IN AERS
2.0000 | INHALATION_SPRAY | Freq: Four times a day (QID) | RESPIRATORY_TRACT | Status: DC | PRN
  Filled 2013-06-19: qty 6.7

## 2013-06-19 MED ORDER — POTASSIUM CHLORIDE CRYS ER 20 MEQ PO TBCR
40.0000 meq | EXTENDED_RELEASE_TABLET | Freq: Once | ORAL | Status: AC
  Administered 2013-06-19: 40 meq via ORAL
  Filled 2013-06-19: qty 2

## 2013-06-19 MED ORDER — ZOLPIDEM TARTRATE 5 MG PO TABS
15.0000 mg | ORAL_TABLET | Freq: Every day | ORAL | Status: DC
  Filled 2013-06-19 (×2): qty 1

## 2013-06-19 MED ORDER — PANTOPRAZOLE SODIUM 40 MG PO TBEC
40.0000 mg | DELAYED_RELEASE_TABLET | Freq: Every day | ORAL | Status: DC
  Administered 2013-06-19: 40 mg via ORAL
  Filled 2013-06-19: qty 1

## 2013-06-19 MED ORDER — THIAMINE HCL 100 MG/ML IJ SOLN: 100.0000 mg | Freq: Every day | INTRAMUSCULAR | Status: DC

## 2013-06-19 MED ORDER — LORAZEPAM 1 MG PO TABS
0.0000 mg | ORAL_TABLET | Freq: Four times a day (QID) | ORAL | Status: DC
  Administered 2013-06-19: 1 mg via ORAL
  Filled 2013-06-19 (×2): qty 1

## 2013-06-19 MED ORDER — VITAMIN B-1 100 MG PO TABS
100.0000 mg | ORAL_TABLET | Freq: Every day | ORAL | Status: DC
  Administered 2013-06-19: 100 mg via ORAL
  Filled 2013-06-19: qty 1

## 2013-06-19 NOTE — BH Assessment (Addendum)
Patient accepted to [facility] by Julieanne Cotton, NP 300 hall or 500. Julieanne Cotton sts that patient will be appropriate for either hall. Assessment staff notified of patient's pending disposition. AC-Tina notified that this patient needs a bed. No bed assignment given as of 1452. Writer will continue following up with [facility] regarding this patient's bed assignment.

## 2013-06-19 NOTE — BH Assessment (Signed)
[facility] Assessment Progress Note  Per Inetta Fermo, RN, AC, pt assigned to Rm 300-2.  Attempted to reach Melynda Ripple, TS twice unsuccessfully. Will continue to call.  Doylene Canning, MA Triage Specialist 06/19/2013 @ 15:55

## 2013-06-19 NOTE — BH Assessment (Signed)
Writer notified Kristen at [facility] (TTS) that patient will not be coming to [facility] for a inpatient admission. Patient will be discharging home.
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
## 2013-06-19 NOTE — ED Provider Notes (Signed)
Medical screening examination/treatment/procedure(s) were performed by non-physician practitioner and as supervising physician I was immediately available for consultation/collaboration.   Keeleigh Terris, MD 06/19/13 0605 

## 2013-06-19 NOTE — BH Assessment (Signed)
Patient accepted to [facility] by Dr. Nuala Alpha, NP. The attending physician is Dr. Geoffery Lyons. Patient's bed assignment is 300-2. Support paperwork completed and faxed to [facility]. Patient's nurse made aware.

## 2013-06-19 NOTE — ED Notes (Signed)
This nurse brought pt inhaler and sleeping medication. Pt was asleep snoring and drooling. This nurse woke pt up to give inhaler. Pt took inhaler and fell back to sleep before she used the inhaler. Pt was awaken again  and reminded to use her inhaler before she fell asleep. Pt stated ok and put inhaler to her mouth and fell back asleep. Pt drink and snack was left at bedside.  Medication was removed from pt hand and placed back in medication bin. Side rails up x2 bed placed in lowest position. Call light in reach

## 2013-06-19 NOTE — ED Provider Notes (Signed)
  The patient was reevaluated requested PTS, who asked that I complete a first opinion for involuntary commitment. I evaluated the patient. She denies suicidal or homicidal ideation. She admits to abusing alcohol and cocaine. She states that her husband, and her do not see eye to eye on several things. She denies headache, weakness, or dizziness.  The patient is angry, but lucid and cooperative. She does not exhibit overt signs of psychosis or depression.  Her IVC petition was rescinded  Patient is discharged home to followup with her medical provider of her choice, and she is advised to stop using cocaine and drinking alcohol inappropriately.      Ashley Melter, MD 06/21/13 [phone]

## 2013-06-19 NOTE — BH Assessment (Signed)
Writer asked Dr. Effie Shy to complete patient's first examination prior to to her discharge to [facility]. Dr Effie Shy sts that patient does not IVC criteria. Dr. Effie Shy will not sign documentation to commit this patient. Per Dr. Rosine Beat note on the rescinding paperwork....Marland Kitchen"Patient is alert and cooperative. She is lucid. She has appropriate medications to take upon discharge. She has appropriate follow up upon discharge. She denies SI, HI, and AVH's. Patient does not meet criteria for an involuntary admission at this time".
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
## 2013-06-19 NOTE — ED Notes (Signed)
Pt is awake and alert, pleasant and cooperative. Patient denies HI, SI AH or VH. Discharge vitals 153/82 HR 88 RR 16 and unlabored. IVC resended by MD.will continue to monitor for safety. Patient escorted to lobby without incident. T.Melvyn Neth RN

## 2013-06-22 NOTE — Consult Note (Signed)
Pt was interviewed with NP. Agree with above assessment and plan. 

## 2014-03-07 ENCOUNTER — Encounter (HOSPITAL_COMMUNITY): Payer: Self-pay | Admitting: Emergency Medicine

## 2014-03-07 DIAGNOSIS — M19049 Primary osteoarthritis, unspecified hand: Secondary | ICD-10-CM | POA: Insufficient documentation

## 2014-03-07 DIAGNOSIS — M19079 Primary osteoarthritis, unspecified ankle and foot: Secondary | ICD-10-CM | POA: Insufficient documentation

## 2014-03-07 DIAGNOSIS — K219 Gastro-esophageal reflux disease without esophagitis: Secondary | ICD-10-CM | POA: Insufficient documentation

## 2014-03-07 DIAGNOSIS — F411 Generalized anxiety disorder: Secondary | ICD-10-CM | POA: Insufficient documentation

## 2014-03-07 DIAGNOSIS — IMO0002 Reserved for concepts with insufficient information to code with codable children: Principal | ICD-10-CM

## 2014-03-07 DIAGNOSIS — F1010 Alcohol abuse, uncomplicated: Secondary | ICD-10-CM | POA: Diagnosis not present

## 2014-03-07 DIAGNOSIS — F319 Bipolar disorder, unspecified: Secondary | ICD-10-CM | POA: Insufficient documentation

## 2014-03-07 DIAGNOSIS — F17200 Nicotine dependence, unspecified, uncomplicated: Secondary | ICD-10-CM | POA: Diagnosis not present

## 2014-03-07 DIAGNOSIS — Z79899 Other long term (current) drug therapy: Secondary | ICD-10-CM | POA: Diagnosis not present

## 2014-03-07 DIAGNOSIS — M1710 Unilateral primary osteoarthritis, unspecified knee: Secondary | ICD-10-CM | POA: Diagnosis not present

## 2014-03-07 LAB — CBC WITH DIFFERENTIAL/PLATELET
BASOS ABS: 0 10*3/uL (ref 0.0–0.1)
Basophils Relative: 0 % (ref 0–1)
Eosinophils Absolute: 0.1 10*3/uL (ref 0.0–0.7)
Eosinophils Relative: 1 % (ref 0–5)
HCT: 35.2 % — ABNORMAL LOW (ref 36.0–46.0)
Hemoglobin: 12.1 g/dL (ref 12.0–15.0)
Lymphocytes Relative: 28 % (ref 12–46)
Lymphs Abs: 2.2 10*3/uL (ref 0.7–4.0)
MCH: 31.3 pg (ref 26.0–34.0)
MCHC: 34.4 g/dL (ref 30.0–36.0)
MCV: 91.2 fL (ref 78.0–100.0)
Monocytes Absolute: 0.4 10*3/uL (ref 0.1–1.0)
Monocytes Relative: 5 % (ref 3–12)
NEUTROS ABS: 5.3 10*3/uL (ref 1.7–7.7)
NEUTROS PCT: 66 % (ref 43–77)
Platelets: 203 10*3/uL (ref 150–400)
RBC: 3.86 MIL/uL — AB (ref 3.87–5.11)
RDW: 12.4 % (ref 11.5–15.5)
WBC: 8 10*3/uL (ref 4.0–10.5)

## 2014-03-07 NOTE — ED Notes (Signed)
Pt. reports painful "nodules"/swelling  at ankles joint , wrists , elbow and fingers for several days , seen by her PCP advised her to see / arrange appointment with rheumatologist. Denies injury / no fever or chills.

## 2014-03-08 ENCOUNTER — Emergency Department (HOSPITAL_COMMUNITY)
Admission: EM | Admit: 2014-03-08 | Discharge: 2014-03-08 | Discharge status: Against Medical Advice | Payer: Medicaid Other | Attending: Emergency Medicine | Admitting: Emergency Medicine

## 2014-03-08 DIAGNOSIS — M1990 Unspecified osteoarthritis, unspecified site: Secondary | ICD-10-CM

## 2014-03-08 LAB — COMPREHENSIVE METABOLIC PANEL
ALK PHOS: 106 U/L (ref 39–117)
ALT: 12 U/L (ref 0–35)
AST: 16 U/L (ref 0–37)
Albumin: 3.9 g/dL (ref 3.5–5.2)
Anion gap: 16 — ABNORMAL HIGH (ref 5–15)
BILIRUBIN TOTAL: 1.3 mg/dL — AB (ref 0.3–1.2)
BUN: 17 mg/dL (ref 6–23)
CALCIUM: 8.7 mg/dL (ref 8.4–10.5)
CO2: 23 meq/L (ref 19–32)
Chloride: 100 mEq/L (ref 96–112)
Creatinine, Ser: 0.92 mg/dL (ref 0.50–1.10)
GFR calc Af Amer: 87 mL/min — ABNORMAL LOW (ref >90)
GFR, EST NON AFRICAN AMERICAN: 75 mL/min — AB (ref >90)
Glucose, Bld: 103 mg/dL — ABNORMAL HIGH (ref 70–99)
POTASSIUM: 3.7 meq/L (ref 3.7–5.3)
SODIUM: 139 meq/L (ref 137–147)
Total Protein: 7.9 g/dL (ref 6.0–8.3)

## 2014-03-08 MED ORDER — KETOROLAC TROMETHAMINE 60 MG/2ML IM SOLN: 60.0000 mg | Freq: Once | INTRAMUSCULAR | Status: DC

## 2014-03-08 NOTE — ED Notes (Signed)
Pt arguing with Dr Ranae PalmsYelverton in hallway, Dr Ranae PalmsYelverton attempting to calm and reassure pt. Pt wishes to sign out AMA.

## 2014-03-08 NOTE — ED Provider Notes (Signed)
CSN: [id number]     Arrival date & time 03/07/14  2242 History   First MD Initiated Contact with Patient 03/08/14 0028     Chief Complaint  Patient presents with  . Joint Pain     (Consider location/radiation/quality/duration/timing/severity/associated sxs/prior Treatment) HPI Patient has a history of polyarthritis, polysubstance abuse, depression, bipolar and presents with worsening of her arthritis in the joints of her hands, bilateral lower extremities including knees and ankles. She's had no fever or chills. No redness. She's being followed by her primary MD. She states she's been seen by a rheumatologist and is being scheduled to see one again. She is asking for pain medication. She specifically requesting narcotics. She states that ibuprofen does not think her pain. Past Medical History  Diagnosis Date  . Alcohol abuse   . Anxiety   . Arthritis   . Small bowel obstruction 2012  . GERD (gastroesophageal reflux disease)   . Hemorrhoids   . Depression   . Bipolar disorder   . Wolf-Parkinson-White syndrome   . Wolf-Parkinson-White syndrome     per patient  . H/O hiatal hernia    Past Surgical History  Procedure Laterality Date  . Tubal ligation    . Esophagogastroduodenoscopy  06/07/2011    Procedure: ESOPHAGOGASTRODUODENOSCOPY (EGD);  Surgeon: Rob Buntinganiel Jacobs, MD;  Location: [facility];  Service: Endoscopy;  Laterality: N/A;   Family History  Problem Relation Age of Onset  . Breast cancer Maternal Aunt   . Colon polyps Maternal Grandmother   . Diabetes Maternal Grandmother   . Heart attack Maternal Grandmother   . Kidney cancer Maternal Grandmother   . Breast cancer Maternal Grandmother   . Colon cancer Neg Hx   . Anesthesia problems Neg Hx   . Hypotension Neg Hx   . Malignant hyperthermia Neg Hx   . Pseudochol deficiency Neg Hx   . Heart disease Father   . Heart disease Paternal Grandfather   . Heart disease Paternal Grandmother    History  Substance Use Topics   . Smoking status: Current Every Day Smoker -- 1.00 packs/day    Types: Cigarettes  . Smokeless tobacco: Never Used  . Alcohol Use: No     Comment: previous alcohol abuse but for last 2 weeks 2 drinks. not currently drinking alcohol   OB History   Grav Para Term Preterm Abortions TAB SAB Ect Mult Living                 Review of Systems  Constitutional: Negative for fever and chills.  Gastrointestinal: Negative for nausea and vomiting.  Musculoskeletal: Positive for arthralgias. Negative for back pain, myalgias, neck pain and neck stiffness.  Skin: Negative for rash and wound.  Neurological: Negative for weakness and numbness.  All other systems reviewed and are negative.     Allergies  Codeine  Home Medications   Prior to Admission medications   Medication Sig Start Date End Date Taking? Authorizing Provider  albuterol (PROVENTIL HFA;VENTOLIN HFA) 108 (90 BASE) MCG/ACT inhaler Inhale 2 puffs into the lungs every 6 (six) hours as needed for shortness of breath (panic attacks).   Yes Historical Provider, MD  amphetamine-dextroamphetamine (ADDERALL XR) 30 MG 24 hr capsule Take 30 mg by mouth 2 (two) times daily.   Yes Historical Provider, MD  amphetamine-dextroamphetamine (ADDERALL) 10 MG tablet Take 10 mg by mouth daily with breakfast.   Yes Historical Provider, MD  clonazePAM (KLONOPIN) 1 MG tablet Take 2.5 mg by mouth 2 (two) times daily.  Yes Historical Provider, MD  diclofenac (VOLTAREN) 75 MG EC tablet Take 75 mg by mouth 2 (two) times daily.   Yes Historical Provider, MD  esomeprazole (NEXIUM) 20 MG capsule Take 20 mg by mouth daily before breakfast.   Yes Historical Provider, MD  triamcinolone cream (KENALOG) 0.1 % Apply 1 application topically 2 (two) times daily.   Yes Historical Provider, MD  ziprasidone (GEODON) 40 MG capsule Take 40 mg by mouth 2 (two) times daily with a meal.   Yes Historical Provider, MD  zolpidem (AMBIEN) 10 MG tablet Take 10 mg by mouth at  bedtime.    Yes Historical Provider, MD   BP 125/83  Pulse 91  Temp(Src) 98.7 F (37.1 C) (Oral)  Resp 16  SpO2 99%  LMP 03/06/2014 Physical Exam  Nursing note and vitals reviewed. Constitutional: She is oriented to person, place, and time. She appears well-developed and well-nourished. No distress.  HENT:  Head: Normocephalic and atraumatic.  Eyes: EOM are normal. Pupils are equal, round, and reactive to light.  Neck: Normal range of motion. Neck supple.  Cardiovascular: Normal rate.   Pulmonary/Chest: Effort normal.  Abdominal: Soft.  Musculoskeletal: Normal range of motion. She exhibits tenderness. She exhibits no edema.  Diffuse tenderness and pain with range of motion to the bilateral knees, bilateral ankles, interphalangeal joints of the hand. There is no obvious swelling. There is no redness. No definite effusion. Patient has good distal cap refill.  Neurological: She is alert and oriented to person, place, and time.  5/5 motor in all extremities. Sensation is intact. Patient is ambulating without any difficulty.  Skin: Skin is warm and dry. No rash noted. No erythema.  Patient with small lesion to the medial side of the left ankle with mild surrounding erythema. Mild inflammation without any obvious infection.  Psychiatric: She has a normal mood and affect. Her behavior is normal.    ED Course  Procedures (including critical care time) Labs Review Labs Reviewed  CBC WITH DIFFERENTIAL - Abnormal; Notable for the following:    RBC 3.86 (*)    HCT 35.2 (*)    All other components within normal limits  COMPREHENSIVE METABOLIC PANEL - Abnormal; Notable for the following:    Glucose, Bld 103 (*)    Total Bilirubin 1.3 (*)    GFR calc non Af Amer 75 (*)    GFR calc Af Amer 87 (*)    Anion gap 16 (*)    All other components within normal limits    Imaging Review No results found.   EKG Interpretation None      MDM   Final diagnoses:  None    Discussed with  patient the need to followup for her chronic medical conditions with the specialist. When told that she would not be receiving any narcotics the patient became irate. She left prior to any medications being given. She left prior to discharge paperwork.  Concern for drug-seeking behavior for the patient's chronic medical condition.    Loren Racer, MD 03/08/14 [phone]

## 2014-04-13 ENCOUNTER — Emergency Department (HOSPITAL_COMMUNITY)
Admission: EM | Admit: 2014-04-13 | Discharge: 2014-04-13 | Disposition: A | Discharge status: Home / Self Care | Payer: No Typology Code available for payment source | Attending: Emergency Medicine | Admitting: Emergency Medicine

## 2014-04-13 ENCOUNTER — Encounter (HOSPITAL_COMMUNITY): Payer: Self-pay | Admitting: Emergency Medicine

## 2014-04-13 ENCOUNTER — Emergency Department (HOSPITAL_COMMUNITY): Payer: No Typology Code available for payment source

## 2014-04-13 DIAGNOSIS — F411 Generalized anxiety disorder: Secondary | ICD-10-CM | POA: Diagnosis not present

## 2014-04-13 DIAGNOSIS — Y92410 Unspecified street and highway as the place of occurrence of the external cause: Secondary | ICD-10-CM | POA: Insufficient documentation

## 2014-04-13 DIAGNOSIS — S0993XA Unspecified injury of face, initial encounter: Secondary | ICD-10-CM | POA: Diagnosis not present

## 2014-04-13 DIAGNOSIS — F319 Bipolar disorder, unspecified: Secondary | ICD-10-CM | POA: Insufficient documentation

## 2014-04-13 DIAGNOSIS — F17200 Nicotine dependence, unspecified, uncomplicated: Secondary | ICD-10-CM | POA: Diagnosis not present

## 2014-04-13 DIAGNOSIS — Z791 Long term (current) use of non-steroidal anti-inflammatories (NSAID): Secondary | ICD-10-CM | POA: Insufficient documentation

## 2014-04-13 DIAGNOSIS — Z79899 Other long term (current) drug therapy: Secondary | ICD-10-CM | POA: Insufficient documentation

## 2014-04-13 DIAGNOSIS — IMO0002 Reserved for concepts with insufficient information to code with codable children: Secondary | ICD-10-CM | POA: Insufficient documentation

## 2014-04-13 DIAGNOSIS — K219 Gastro-esophageal reflux disease without esophagitis: Secondary | ICD-10-CM | POA: Insufficient documentation

## 2014-04-13 DIAGNOSIS — M542 Cervicalgia: Secondary | ICD-10-CM

## 2014-04-13 DIAGNOSIS — S199XXA Unspecified injury of neck, initial encounter: Secondary | ICD-10-CM

## 2014-04-13 DIAGNOSIS — Y9389 Activity, other specified: Secondary | ICD-10-CM | POA: Diagnosis not present

## 2014-04-13 DIAGNOSIS — M129 Arthropathy, unspecified: Secondary | ICD-10-CM | POA: Diagnosis not present

## 2014-04-13 DIAGNOSIS — I456 Pre-excitation syndrome: Secondary | ICD-10-CM | POA: Insufficient documentation

## 2014-04-13 DIAGNOSIS — M5489 Other dorsalgia: Secondary | ICD-10-CM

## 2014-04-13 MED ORDER — METHOCARBAMOL 500 MG PO TABS
500.0000 mg | ORAL_TABLET | Freq: Once | ORAL | Status: AC
  Administered 2014-04-13: 500 mg via ORAL
  Filled 2014-04-13: qty 1

## 2014-04-13 MED ORDER — HYDROCODONE-ACETAMINOPHEN 5-325 MG PO TABS
2.0000 | ORAL_TABLET | Freq: Once | ORAL | Status: DC
Start: 2014-04-13 — End: 2014-04-13
  Filled 2014-04-13: qty 2

## 2014-04-13 MED ORDER — OXYCODONE-ACETAMINOPHEN 5-325 MG PO TABS: 1.0000 | ORAL_TABLET | Freq: Four times a day (QID) | ORAL | Status: DC | PRN

## 2014-04-13 MED ORDER — OXYCODONE-ACETAMINOPHEN 5-325 MG PO TABS
2.0000 | ORAL_TABLET | Freq: Once | ORAL | Status: AC
  Administered 2014-04-13: 2 via ORAL
  Filled 2014-04-13: qty 2

## 2014-04-13 MED ORDER — METHOCARBAMOL 500 MG PO TABS: 500.0000 mg | ORAL_TABLET | Freq: Two times a day (BID) | ORAL | Status: DC

## 2014-04-13 MED ORDER — NAPROXEN 500 MG PO TABS: 500.0000 mg | ORAL_TABLET | Freq: Two times a day (BID) | ORAL | Status: DC
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
## 2014-04-17 NOTE — ED Provider Notes (Signed)
Medical screening examination/treatment/procedure(s) were performed by non-physician practitioner and as supervising physician I was immediately available for consultation/collaboration.  Audree Camel, MD 04/17/14 [phone]

## 2014-04-19 ENCOUNTER — Encounter (HOSPITAL_COMMUNITY): Payer: Self-pay | Admitting: Emergency Medicine

## 2014-04-19 ENCOUNTER — Emergency Department (HOSPITAL_COMMUNITY)
Admission: EM | Admit: 2014-04-19 | Discharge: 2014-04-19 | Disposition: A | Discharge status: Home / Self Care | Payer: No Typology Code available for payment source | Attending: Emergency Medicine | Admitting: Emergency Medicine

## 2014-04-19 DIAGNOSIS — F411 Generalized anxiety disorder: Secondary | ICD-10-CM | POA: Insufficient documentation

## 2014-04-19 DIAGNOSIS — K219 Gastro-esophageal reflux disease without esophagitis: Secondary | ICD-10-CM | POA: Diagnosis not present

## 2014-04-19 DIAGNOSIS — F17200 Nicotine dependence, unspecified, uncomplicated: Secondary | ICD-10-CM | POA: Insufficient documentation

## 2014-04-19 DIAGNOSIS — F319 Bipolar disorder, unspecified: Secondary | ICD-10-CM | POA: Diagnosis not present

## 2014-04-19 DIAGNOSIS — Z8739 Personal history of other diseases of the musculoskeletal system and connective tissue: Secondary | ICD-10-CM | POA: Insufficient documentation

## 2014-04-19 DIAGNOSIS — M549 Dorsalgia, unspecified: Secondary | ICD-10-CM | POA: Insufficient documentation

## 2014-04-19 DIAGNOSIS — Z791 Long term (current) use of non-steroidal anti-inflammatories (NSAID): Secondary | ICD-10-CM | POA: Insufficient documentation

## 2014-04-19 DIAGNOSIS — Z79899 Other long term (current) drug therapy: Secondary | ICD-10-CM | POA: Diagnosis not present

## 2014-04-19 DIAGNOSIS — Z8679 Personal history of other diseases of the circulatory system: Secondary | ICD-10-CM | POA: Insufficient documentation

## 2014-04-19 DIAGNOSIS — Z8719 Personal history of other diseases of the digestive system: Secondary | ICD-10-CM | POA: Diagnosis not present

## 2014-04-19 MED ORDER — OXYCODONE HCL 5 MG PO CAPS: 5.0000 mg | ORAL_CAPSULE | ORAL | Status: DC | PRN

## 2014-04-19 MED ORDER — CYCLOBENZAPRINE HCL 10 MG PO TABS: 10.0000 mg | ORAL_TABLET | Freq: Two times a day (BID) | ORAL | Status: DC | PRN
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
## 2014-04-19 NOTE — ED Provider Notes (Signed)
Medical screening examination/treatment/procedure(s) were performed by non-physician practitioner and as supervising physician I was immediately available for consultation/collaboration.  Audree Camel, MD 04/19/14 [phone]

## 2014-04-19 NOTE — Discharge Instructions (Signed)
Back Pain, Adult Low back pain is very common. About 1 in 5 people have back pain.The cause of low back pain is rarely dangerous. The pain often gets better over time.About half of people with a sudden onset of back pain feel better in just 2 weeks. About 8 in 10 people feel better by 6 weeks.  CAUSES Some common causes of back pain include:  Strain of the muscles or ligaments supporting the spine.  Wear and tear (degeneration) of the spinal discs.  Arthritis.  Direct injury to the back. DIAGNOSIS Most of the time, the direct cause of low back pain is not known.However, back pain can be treated effectively even when the exact cause of the pain is unknown.Answering your caregiver's questions about your overall health and symptoms is one of the most accurate ways to make sure the cause of your pain is not dangerous. If your caregiver needs more information, he or she may order lab work or imaging tests (X-rays or MRIs).However, even if imaging tests show changes in your back, this usually does not require surgery. HOME CARE INSTRUCTIONS For many people, back pain returns.Since low back pain is rarely dangerous, it is often a condition that people can learn to manageon their own.   Remain active. It is stressful on the back to sit or stand in one place. Do not sit, drive, or stand in one place for more than 30 minutes at a time. Take short walks on level surfaces as soon as pain allows.Try to increase the length of time you walk each day.  Do not stay in bed.Resting more than 1 or 2 days can delay your recovery.  Do not avoid exercise or work.Your body is made to move.It is not dangerous to be active, even though your back may hurt.Your back will likely heal faster if you return to being active before your pain is gone.  Pay attention to your body when you bend and lift. Many people have less discomfortwhen lifting if they bend their knees, keep the load close to their bodies,and  avoid twisting. Often, the most comfortable positions are those that put less stress on your recovering back.  Find a comfortable position to sleep. Use a firm mattress and lie on your side with your knees slightly bent. If you lie on your back, put a pillow under your knees.  Only take over-the-counter or prescription medicines as directed by your caregiver. Over-the-counter medicines to reduce pain and inflammation are often the most helpful.Your caregiver may prescribe muscle relaxant drugs.These medicines help dull your pain so you can more quickly return to your normal activities and healthy exercise.  Put ice on the injured area.  Put ice in a plastic bag.  Place a towel between your skin and the bag.  Leave the ice on for 15-20 minutes, 03-04 times a day for the first 2 to 3 days. After that, ice and heat may be alternated to reduce pain and spasms.  Ask your caregiver about trying back exercises and gentle massage. This may be of some benefit.  Avoid feeling anxious or stressed.Stress increases muscle tension and can worsen back pain.It is important to recognize when you are anxious or stressed and learn ways to manage it.Exercise is a great option. SEEK MEDICAL CARE IF:  You have pain that is not relieved with rest or medicine.  You have pain that does not improve in 1 week.  You have new symptoms.  You are generally not feeling well. SEEK   IMMEDIATE MEDICAL CARE IF:   You have pain that radiates from your back into your legs.  You develop new bowel or bladder control problems.  You have unusual weakness or numbness in your arms or legs.  You develop nausea or vomiting.  You develop abdominal pain.  You feel faint. Document Released: 07/16/2005 Document Revised: 01/15/2012 Document Reviewed: 11/17/2013 [employer] Patient Information 2015 [employer]. This information is not intended to replace advice given to you by your health care provider. Make sure you  discuss any questions you have with your health care provider.  
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
## 2014-05-13 ENCOUNTER — Encounter (HOSPITAL_COMMUNITY): Payer: Self-pay | Admitting: Emergency Medicine

## 2014-05-13 ENCOUNTER — Emergency Department (HOSPITAL_COMMUNITY)
Admission: EM | Admit: 2014-05-13 | Discharge: 2014-05-13 | Disposition: A | Discharge status: Home / Self Care | Payer: No Typology Code available for payment source | Attending: Emergency Medicine | Admitting: Emergency Medicine

## 2014-05-13 DIAGNOSIS — K219 Gastro-esophageal reflux disease without esophagitis: Secondary | ICD-10-CM | POA: Insufficient documentation

## 2014-05-13 DIAGNOSIS — Z79899 Other long term (current) drug therapy: Secondary | ICD-10-CM | POA: Insufficient documentation

## 2014-05-13 DIAGNOSIS — Z791 Long term (current) use of non-steroidal anti-inflammatories (NSAID): Secondary | ICD-10-CM | POA: Diagnosis not present

## 2014-05-13 DIAGNOSIS — M5441 Lumbago with sciatica, right side: Secondary | ICD-10-CM

## 2014-05-13 DIAGNOSIS — Y92410 Unspecified street and highway as the place of occurrence of the external cause: Secondary | ICD-10-CM | POA: Insufficient documentation

## 2014-05-13 DIAGNOSIS — Z8679 Personal history of other diseases of the circulatory system: Secondary | ICD-10-CM | POA: Insufficient documentation

## 2014-05-13 DIAGNOSIS — Z7952 Long term (current) use of systemic steroids: Secondary | ICD-10-CM | POA: Insufficient documentation

## 2014-05-13 DIAGNOSIS — M5442 Lumbago with sciatica, left side: Secondary | ICD-10-CM

## 2014-05-13 DIAGNOSIS — S3982XA Other specified injuries of lower back, initial encounter: Secondary | ICD-10-CM | POA: Insufficient documentation

## 2014-05-13 DIAGNOSIS — M1990 Unspecified osteoarthritis, unspecified site: Secondary | ICD-10-CM | POA: Diagnosis not present

## 2014-05-13 DIAGNOSIS — Z720 Tobacco use: Secondary | ICD-10-CM | POA: Insufficient documentation

## 2014-05-13 DIAGNOSIS — Z792 Long term (current) use of antibiotics: Secondary | ICD-10-CM | POA: Insufficient documentation

## 2014-05-13 DIAGNOSIS — M5440 Lumbago with sciatica, unspecified side: Secondary | ICD-10-CM | POA: Insufficient documentation

## 2014-05-13 DIAGNOSIS — Y9389 Activity, other specified: Secondary | ICD-10-CM | POA: Diagnosis not present

## 2014-05-13 DIAGNOSIS — F319 Bipolar disorder, unspecified: Secondary | ICD-10-CM | POA: Diagnosis not present

## 2014-05-13 MED ORDER — METHOCARBAMOL 500 MG PO TABS
500.0000 mg | ORAL_TABLET | Freq: Once | ORAL | Status: AC
Start: 2014-05-13 — End: 2014-05-13
  Administered 2014-05-13: 500 mg via ORAL
  Filled 2014-05-13: qty 1

## 2014-05-13 MED ORDER — IBUPROFEN 800 MG PO TABS: 800.0000 mg | ORAL_TABLET | Freq: Three times a day (TID) | ORAL | Status: DC

## 2014-05-13 MED ORDER — METHOCARBAMOL 500 MG PO TABS: 500.0000 mg | ORAL_TABLET | Freq: Two times a day (BID) | ORAL | Status: DC

## 2014-05-13 MED ORDER — IBUPROFEN 400 MG PO TABS
800.0000 mg | ORAL_TABLET | Freq: Once | ORAL | Status: AC
  Administered 2014-05-13: 800 mg via ORAL
  Filled 2014-05-13: qty 2
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
## 2014-05-14 NOTE — ED Provider Notes (Signed)
Medical screening examination/treatment/procedure(s) were performed by non-physician practitioner and as supervising physician I was immediately available for consultation/collaboration.   EKG Interpretation None      Devoria AlbeIva Etai Copado, MD, Armando GangFACEP   Ward GivensIva L Laterra Lubinski, MD 05/14/14 [phone]

## 2014-05-31 ENCOUNTER — Emergency Department (HOSPITAL_COMMUNITY)
Admission: EM | Admit: 2014-05-31 | Discharge: 2014-05-31 | Disposition: A | Discharge status: Home / Self Care | Payer: Medicaid Other | Attending: Emergency Medicine | Admitting: Emergency Medicine

## 2014-05-31 ENCOUNTER — Encounter (HOSPITAL_COMMUNITY): Payer: Self-pay | Admitting: Emergency Medicine

## 2014-05-31 DIAGNOSIS — Y9289 Other specified places as the place of occurrence of the external cause: Secondary | ICD-10-CM | POA: Diagnosis not present

## 2014-05-31 DIAGNOSIS — Z8679 Personal history of other diseases of the circulatory system: Secondary | ICD-10-CM | POA: Insufficient documentation

## 2014-05-31 DIAGNOSIS — F419 Anxiety disorder, unspecified: Secondary | ICD-10-CM | POA: Insufficient documentation

## 2014-05-31 DIAGNOSIS — M1990 Unspecified osteoarthritis, unspecified site: Secondary | ICD-10-CM | POA: Insufficient documentation

## 2014-05-31 DIAGNOSIS — Z791 Long term (current) use of non-steroidal anti-inflammatories (NSAID): Secondary | ICD-10-CM | POA: Insufficient documentation

## 2014-05-31 DIAGNOSIS — K219 Gastro-esophageal reflux disease without esophagitis: Secondary | ICD-10-CM | POA: Diagnosis not present

## 2014-05-31 DIAGNOSIS — Z79899 Other long term (current) drug therapy: Secondary | ICD-10-CM | POA: Diagnosis not present

## 2014-05-31 DIAGNOSIS — F319 Bipolar disorder, unspecified: Secondary | ICD-10-CM | POA: Insufficient documentation

## 2014-05-31 DIAGNOSIS — S3992XA Unspecified injury of lower back, initial encounter: Secondary | ICD-10-CM | POA: Diagnosis not present

## 2014-05-31 DIAGNOSIS — Y9301 Activity, walking, marching and hiking: Secondary | ICD-10-CM | POA: Diagnosis not present

## 2014-05-31 DIAGNOSIS — M549 Dorsalgia, unspecified: Secondary | ICD-10-CM

## 2014-05-31 DIAGNOSIS — Z720 Tobacco use: Secondary | ICD-10-CM | POA: Diagnosis not present

## 2014-05-31 DIAGNOSIS — W108XXA Fall (on) (from) other stairs and steps, initial encounter: Secondary | ICD-10-CM | POA: Insufficient documentation

## 2014-05-31 DIAGNOSIS — W19XXXA Unspecified fall, initial encounter: Secondary | ICD-10-CM

## 2014-05-31 MED ORDER — HYDROCODONE-ACETAMINOPHEN 5-325 MG PO TABS: 1.0000 | ORAL_TABLET | ORAL | Status: DC | PRN

## 2014-05-31 NOTE — ED Notes (Signed)
Talked with patient about plan of care. States she does not want xrays. States "I just need something so I'm not hurting or feel sick".
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
## 2014-05-31 NOTE — ED Notes (Signed)
Pt sts fell down stairs yesterday after tripping over dogs; pt sts pain in back and legs; pt denies LOC

## 2014-05-31 NOTE — ED Notes (Signed)
esignature pad not working. Pt. Signed and verbalized understanding of paperwork.

## 2014-05-31 NOTE — Discharge Instructions (Signed)
Read the information below.  Use the prescribed medication as directed.  Please discuss all new medications with your pharmacist.  Do not take additional tylenol while taking the prescribed pain medication to avoid overdose.  You may return to the Emergency Department at any time for worsening condition or any new symptoms that concern you.   If you develop fevers, loss of control of bowel or bladder, weakness or numbness in your legs, or are unable to walk, return to the ER for a recheck.   Please follow up with your chiropractor and your primary care provider for your back pain.     Back Pain, Adult Low back pain is very common. About 1 in 5 people have back pain.The cause of low back pain is rarely dangerous. The pain often gets better over time.About half of people with a sudden onset of back pain feel better in just 2 weeks. About 8 in 10 people feel better by 6 weeks.  CAUSES Some common causes of back pain include:  Strain of the muscles or ligaments supporting the spine.  Wear and tear (degeneration) of the spinal discs.  Arthritis.  Direct injury to the back. DIAGNOSIS Most of the time, the direct cause of low back pain is not known.However, back pain can be treated effectively even when the exact cause of the pain is unknown.Answering your caregiver's questions about your overall health and symptoms is one of the most accurate ways to make sure the cause of your pain is not dangerous. If your caregiver needs more information, he or she may order lab work or imaging tests (X-rays or MRIs).However, even if imaging tests show changes in your back, this usually does not require surgery. HOME CARE INSTRUCTIONS For many people, back pain returns.Since low back pain is rarely dangerous, it is often a condition that people can learn to [facility]manage[facility] their own.   Remain active. It is stressful on the back to sit or stand in one place. Do not sit, drive, or stand in one place for more than 30  minutes at a time. Take short walks on level surfaces as soon as pain allows.Try to increase the length of time you walk each day.  Do not stay in bed.Resting more than 1 or 2 days can delay your recovery.  Do not avoid exercise or work.Your body is made to move.It is not dangerous to be active, even though your back may hurt.Your back will likely heal faster if you return to being active before your pain is gone.  Pay attention to your body when you bend and lift. Many people have less discomfortwhen lifting if they bend their knees, keep the load close to their bodies,and avoid twisting. Often, the most comfortable positions are those that put less stress on your recovering back.  Find a comfortable position to sleep. Use a firm mattress and lie on your side with your knees slightly bent. If you lie on your back, put a pillow under your knees.  Only take over-the-counter or prescription medicines as directed by your caregiver. Over-the-counter medicines to reduce pain and inflammation are often the most helpful.Your caregiver may prescribe muscle relaxant drugs.These medicines help dull your pain so you can more quickly return to your normal activities and healthy exercise.  Put ice on the injured area.  Put ice in a plastic bag.  Place a towel between your skin and the bag.  Leave the ice on for 15-20 minutes, 03-04 times a day for the first  2 to 3 days. After that, ice and heat may be alternated to reduce pain and spasms.  Ask your caregiver about trying back exercises and gentle massage. This may be of some benefit.  Avoid feeling anxious or stressed.Stress increases muscle tension and can worsen back pain.It is important to recognize when you are anxious or stressed and learn ways to manage it.Exercise is a great option. SEEK MEDICAL CARE IF:  You have pain that is not relieved with rest or medicine.  You have pain that does not improve in 1 week.  You have new  symptoms.  You are generally not feeling well. SEEK IMMEDIATE MEDICAL CARE IF:   You have pain that radiates from your back into your legs.  You develop new bowel or bladder control problems.  You have unusual weakness or numbness in your arms or legs.  You develop nausea or vomiting.  You develop abdominal pain.  You feel faint. Document Released: 07/16/2005 Document Revised: 01/15/2012 Document Reviewed: 11/17/2013 [facility] Patient Information 2015 [place], [place]. This information is not intended to replace advice given to you by your health care provider. Make sure you discuss any questions you have with your health care provider.

## 2020-05-27 ENCOUNTER — Other Ambulatory Visit (HOSPITAL_COMMUNITY): Payer: Self-pay | Admitting: Physician Assistant

## 2020-05-27 ENCOUNTER — Encounter (HOSPITAL_COMMUNITY): Payer: Self-pay | Admitting: Physician Assistant

## 2020-05-27 ENCOUNTER — Other Ambulatory Visit: Payer: Self-pay

## 2020-05-27 ENCOUNTER — Ambulatory Visit (INDEPENDENT_AMBULATORY_CARE_PROVIDER_SITE_OTHER): Payer: No Payment, Other | Admitting: Physician Assistant

## 2020-05-27 ENCOUNTER — Encounter (HOSPITAL_COMMUNITY): Payer: Self-pay | Admitting: Licensed Clinical Social Worker

## 2020-05-27 ENCOUNTER — Ambulatory Visit (INDEPENDENT_AMBULATORY_CARE_PROVIDER_SITE_OTHER): Payer: No Payment, Other | Admitting: Licensed Clinical Social Worker

## 2020-05-27 VITALS — BP 115/80 | HR 88 | Ht 66.0 in | Wt 203.0 lb

## 2020-05-27 DIAGNOSIS — F4310 Post-traumatic stress disorder, unspecified: Secondary | ICD-10-CM

## 2020-05-27 DIAGNOSIS — F250 Schizoaffective disorder, bipolar type: Secondary | ICD-10-CM

## 2020-05-27 DIAGNOSIS — F5105 Insomnia due to other mental disorder: Secondary | ICD-10-CM | POA: Diagnosis not present

## 2020-05-27 DIAGNOSIS — F99 Mental disorder, not otherwise specified: Secondary | ICD-10-CM | POA: Diagnosis not present

## 2020-05-27 MED ORDER — TRAZODONE HCL 100 MG PO TABS: 100.0000 mg | ORAL_TABLET | Freq: Every evening | ORAL | 1 refills | Status: DC | PRN

## 2020-05-27 MED ORDER — RISPERIDONE 2 MG PO TABS: 2.0000 mg | ORAL_TABLET | Freq: Every day | ORAL | 1 refills | Status: DC

## 2020-05-27 MED ORDER — ESCITALOPRAM OXALATE 10 MG PO TABS: 10.0000 mg | ORAL_TABLET | Freq: Every day | ORAL | 2 refills | Status: DC

## 2020-05-27 MED FILL — ESCITALOPRAM 10 MG TABLET: 10 | 22 days supply | Qty: 30 | Fill #0

## 2020-05-27 MED FILL — TRAZODONE HCL 100 MG TABS: 100 | 30 days supply | Qty: 30 | Fill #0

## 2020-05-27 MED FILL — risperiDONE 2 MG TABS: 2 | 30 days supply | Qty: 30 | Fill #0
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
## 2020-06-20 MED FILL — TRAZODONE HCL 100 MG TABS: 100 | 30 days supply | Qty: 30 | Fill #1

## 2020-06-20 MED FILL — risperiDONE 2 MG TABS: 2 | 30 days supply | Qty: 30 | Fill #1

## 2020-06-20 MED FILL — ESCITALOPRAM 10 MG TABLET: 10 | 30 days supply | Qty: 60 | Fill #1

## 2020-06-25 ENCOUNTER — Encounter (HOSPITAL_COMMUNITY): Payer: Self-pay | Admitting: Physician Assistant
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
## 2020-06-29 ENCOUNTER — Telehealth (HOSPITAL_COMMUNITY): Payer: Self-pay | Admitting: *Deleted

## 2020-06-29 ENCOUNTER — Encounter (HOSPITAL_COMMUNITY): Payer: Self-pay | Admitting: Physician Assistant

## 2020-06-29 ENCOUNTER — Other Ambulatory Visit (HOSPITAL_COMMUNITY): Payer: Self-pay | Admitting: Physician Assistant

## 2020-06-29 ENCOUNTER — Other Ambulatory Visit: Payer: Self-pay

## 2020-06-29 ENCOUNTER — Ambulatory Visit (INDEPENDENT_AMBULATORY_CARE_PROVIDER_SITE_OTHER): Payer: No Payment, Other | Admitting: Physician Assistant

## 2020-06-29 VITALS — BP 133/107 | HR 70 | Temp 98.1°F | Ht 66.0 in

## 2020-06-29 DIAGNOSIS — F250 Schizoaffective disorder, bipolar type: Secondary | ICD-10-CM

## 2020-06-29 DIAGNOSIS — F410 Panic disorder [episodic paroxysmal anxiety] without agoraphobia: Secondary | ICD-10-CM

## 2020-06-29 DIAGNOSIS — F5105 Insomnia due to other mental disorder: Secondary | ICD-10-CM | POA: Diagnosis not present

## 2020-06-29 DIAGNOSIS — F4310 Post-traumatic stress disorder, unspecified: Secondary | ICD-10-CM | POA: Insufficient documentation

## 2020-06-29 DIAGNOSIS — F99 Mental disorder, not otherwise specified: Secondary | ICD-10-CM

## 2020-06-29 MED ORDER — RISPERIDONE 1 MG PO TABS: 1.0000 mg | ORAL_TABLET | Freq: Every morning | ORAL | 1 refills | Status: DC

## 2020-06-29 MED ORDER — CLONAZEPAM 1 MG PO TABS: 1.0000 mg | ORAL_TABLET | Freq: Two times a day (BID) | ORAL | 0 refills | Status: DC | PRN

## 2020-06-29 MED ORDER — ESCITALOPRAM OXALATE 20 MG PO TABS: 20.0000 mg | ORAL_TABLET | Freq: Every day | ORAL | 1 refills | Status: DC

## 2020-06-29 MED ORDER — TRAZODONE HCL 150 MG PO TABS: 150.0000 mg | ORAL_TABLET | Freq: Every evening | ORAL | 1 refills | Status: DC | PRN

## 2020-06-29 MED ORDER — BENZTROPINE MESYLATE 0.5 MG PO TABS: 0.5000 mg | ORAL_TABLET | Freq: Two times a day (BID) | ORAL | 1 refills | Status: DC

## 2020-06-29 MED ORDER — RISPERIDONE 2 MG PO TABS: 2.0000 mg | ORAL_TABLET | Freq: Every day | ORAL | 1 refills | Status: DC

## 2020-06-29 MED FILL — risperiDONE 1 MG TABS: 1 | 30 days supply | Qty: 30 | Fill #0

## 2020-06-29 MED FILL — BENZTROPINE MES 0.5 MG TAB: 0.5 | 30 days supply | Qty: 60 | Fill #0

## 2020-06-29 NOTE — Telephone Encounter (Signed)
Call from patient asking Eddie PA to move her Klonopin rx to [facility] on file as [facility]s doesn't fill rxs for controlled drugs. Forwarded concern to the PA.

## 2020-06-29 NOTE — Progress Notes (Signed)
BH MD/PA/NP OP Progress Note  06/29/2020 9:50 PM [name]  MRN:  [id number]  Chief Complaint:  Chief Complaint    Medication Management     HPI:  [name] is a 49 year old female with a past psychiatric history significant for PTSD, schizoaffective disorder (bipolar type), and insomnia who presents to [facility] for medication management.  Patient is currently being managed on the following medications:  Risperidone 2 mg at bedtime Escitalopram 20 mg 1 time daily Trazodone 100 mg at bedtime  Patient reports feeling stressed out with difficulty sleeping for the past couple of days.  Patient endorses having panic attacks and trouble falling asleep for the past two nights.  Patient states that she is often woken up by the inability to breathe which triggers her panic attacks.  She endorses being under a lot of stress which she attributes to the demanding nature of taking care of her father.  While taking care of her father, some of her responsibilities include: Making his breakfast, giving his medications, cooking, keeping up with his medical appointments, and interacting with his lawyers.   In addition to her night disturbances, patient states that her tolerance around others has not been good.  Patient expresses that although she is not as ugly as she used to be towards people, she still gets real edgy.  Patient feels that her edginess and irritability is due to all the stress that she is under.  Patient does report feeling happier and denies depressive symptoms such as feeling sad and difficulty concentrating.  Patient is interested in adjusting some of her medications to help combat some of the symptoms she has been going through.  Patient is interested in being placed back on Klonopin for the management of her anxiety.  Patient has been on BuSpar and hydroxyzine with little to no success.  Patient has been tolerating trazodone well and is open to  increasing the dosage of her trazodone to help promote better sleep.  Patient denies suicidal and homicidal ideations.  She further denies auditory and visual hallucinations.  Patient states that she goes to bed no later than 10 but often finds herself awake between 3 AM and 5 AM.  Anytime that she does wake up, patient reports experiencing panic attacks.  Patient states that she has experienced a total of 6 panic attacks in the past 2 nights.  Patient endorses good appetite and eats 3 meals a day.  Patient denies alcohol consumption and illicit drug use.  She endorses tobacco use and smokes a pack a day.  Visit Diagnosis:    ICD-10-CM   1. Schizoaffective disorder, bipolar type (HCC)  F25.0 risperiDONE (RISPERDAL) 1 MG tablet    risperiDONE (RISPERDAL) 2 MG tablet    benztropine (COGENTIN) 0.5 MG tablet  2. PTSD (post-traumatic stress disorder)  F43.10 escitalopram (LEXAPRO) 20 MG tablet  3. Insomnia due to other mental disorder  F51.05 traZODone (DESYREL) 150 MG tablet   F99   4. Panic attacks  F41.0 clonazePAM (KLONOPIN) 1 MG tablet    Past Psychiatric History: Schizoaffective, bipolar type Insomnia Anxiety PTSD Panic attacks  Past Medical History:  Past Medical History:  Diagnosis Date  . Alcohol abuse   . Anxiety   . Arthritis   . Bipolar disorder (HCC)   . Depression   . GERD (gastroesophageal reflux disease)   . H/O hiatal hernia   . Hemorrhoids   . Small bowel obstruction (HCC) 2012  . Wolf-Parkinson-White syndrome   .  Wolf-Parkinson-White syndrome    per patient    Past Surgical History:  Procedure Laterality Date  . ESOPHAGOGASTRODUODENOSCOPY  06/07/2011   Procedure: ESOPHAGOGASTRODUODENOSCOPY (EGD);  Surgeon: Rob Bunting, MD;  Location: [facility];  Service: Endoscopy;  Laterality: N/A;  . TUBAL LIGATION      Family Psychiatric History:  Grandfather's sister has schizophrenia Grandmother's brother has a mental illness of some sort  Family History:   Family History  Problem Relation Age of Onset  . Breast cancer Maternal Aunt   . Colon polyps Maternal Grandmother   . Diabetes Maternal Grandmother   . Heart attack Maternal Grandmother   . Kidney cancer Maternal Grandmother   . Breast cancer Maternal Grandmother   . Colon cancer Neg Hx   . Anesthesia problems Neg Hx   . Hypotension Neg Hx   . Malignant hyperthermia Neg Hx   . Pseudochol deficiency Neg Hx   . Heart disease Father   . Heart disease Paternal Grandfather   . Heart disease Paternal Grandmother     Social History:  Social History   Socioeconomic History  . Marital status: Single    Spouse name: Not on file  . Number of children: 4  . Years of education: Not on file  . Highest education level: Not on file  Occupational History  . Occupation: house wife  Tobacco Use  . Smoking status: Current Every Day Smoker    Packs/day: 1.00    Types: Cigarettes  . Smokeless tobacco: Never Used  Substance and Sexual Activity  . Alcohol use: No    Comment: previous alcohol abuse but for last 2 weeks 2 drinks. not currently drinking alcohol  . Drug use: No  . Sexual activity: Not Currently    Birth control/protection: None  Other Topics Concern  . Not on file  Social History Narrative  . Not on file   Social Determinants of Health   Financial Resource Strain: Medium Risk  . Difficulty of Paying Living Expenses: Somewhat hard  Food Insecurity: No Food Insecurity  . Worried About Programme researcher, broadcasting/film/video in the Last Year: Never true  . Ran Out of Food in the Last Year: Never true  Transportation Needs: No Transportation Needs  . Lack of Transportation (Medical): No  . Lack of Transportation (Non-Medical): No  Physical Activity: Inactive  . Days of Exercise per Week: 0 days  . Minutes of Exercise per Session: 0 min  Stress: Stress Concern Present  . Feeling of Stress : Very much  Social Connections: Moderately Isolated  . Frequency of Communication with Friends and  Family: More than three times a week  . Frequency of Social Gatherings with Friends and Family: Once a week  . Attends Religious Services: Never  . Active Member of Clubs or Organizations: No  . Attends Banker Meetings: Never  . Marital Status: Married    Allergies:  Allergies  Allergen Reactions  . Codeine Nausea Only    Metabolic Disorder Labs: No results found for: HGBA1C, MPG No results found for: PROLACTIN Lab Results  Component Value Date   CHOL  01/03/2009    147        ATP III CLASSIFICATION:  <200     mg/dL   Desirable  782-956  mg/dL   Borderline High  >=213    mg/dL   High          TRIG 086 01/03/2009   HDL 19 (L) 01/03/2009   CHOLHDL 7.7 01/03/2009  VLDL 28 01/03/2009   LDLCALC (H) 01/03/2009    100        Total Cholesterol/HDL:CHD Risk Coronary Heart Disease Risk Table                     Men   Women  1/2 Average Risk   3.4   3.3  Average Risk       5.0   4.4  2 X Average Risk   9.6   7.1  3 X Average Risk  23.4   11.0        Use the calculated Patient Ratio above and the CHD Risk Table to determine the patient's CHD Risk.        ATP III CLASSIFICATION (LDL):  <100     mg/dL   Optimal  161-096100-129  mg/dL   Near or Above                    Optimal  130-159  mg/dL   Borderline  [id number]  mg/dL   High  >811>190     mg/dL   Very High     Therapeutic Level Labs: No results found for: LITHIUM No results found for: VALPROATE No components found for:  CBMZ  Current Medications: Current Outpatient Medications  Medication Sig Dispense Refill  . albuterol (PROVENTIL HFA;VENTOLIN HFA) 108 (90 BASE) MCG/ACT inhaler Inhale 2 puffs into the lungs every 6 (six) hours as needed for shortness of breath (panic attacks). (Patient not taking: Reported on 05/27/2020)    . amphetamine-dextroamphetamine (ADDERALL XR) 30 MG 24 hr capsule Take 30 mg by mouth 2 (two) times daily. (Patient not taking: Reported on 05/27/2020)    . amphetamine-dextroamphetamine  (ADDERALL) 10 MG tablet Take 10 mg by mouth daily with breakfast. (Patient not taking: Reported on 05/27/2020)    . benztropine (COGENTIN) 0.5 MG tablet Take 1 tablet (0.5 mg total) by mouth 2 (two) times daily. 60 tablet 1  . clonazePAM (KLONOPIN) 1 MG tablet Take 1 tablet (1 mg total) by mouth 2 (two) times daily as needed for anxiety. 30 tablet 0  . cyclobenzaprine (FLEXERIL) 10 MG tablet Take 1 tablet (10 mg total) by mouth 2 (two) times daily as needed for muscle spasms. (Patient not taking: Reported on 05/27/2020) 20 tablet 0  . diclofenac (VOLTAREN) 75 MG EC tablet Take 75 mg by mouth 2 (two) times daily. (Patient not taking: Reported on 05/27/2020)    . escitalopram (LEXAPRO) 20 MG tablet Take 1 tablet (20 mg total) by mouth daily. 30 tablet 1  . esomeprazole (NEXIUM) 20 MG capsule Take 20 mg by mouth daily before breakfast. (Patient not taking: Reported on 05/27/2020)    . HYDROcodone-acetaminophen (NORCO/VICODIN) 5-325 MG per tablet Take 1 tablet by mouth every 4 (four) hours as needed for moderate pain or severe pain. (Patient not taking: Reported on 05/27/2020) 4 tablet 0  . ibuprofen (ADVIL,MOTRIN) 800 MG tablet Take 1 tablet (800 mg total) by mouth 3 (three) times daily. (Patient not taking: Reported on 05/27/2020) 21 tablet 0  . methocarbamol (ROBAXIN) 500 MG tablet Take 1 tablet (500 mg total) by mouth 2 (two) times daily. (Patient not taking: Reported on 05/27/2020) 20 tablet 0  . methocarbamol (ROBAXIN) 500 MG tablet Take 1 tablet (500 mg total) by mouth 2 (two) times daily. (Patient not taking: Reported on 05/27/2020) 20 tablet 0  . naproxen (NAPROSYN) 500 MG tablet Take 1 tablet (500 mg total) by mouth 2 (two) times  daily. (Patient not taking: Reported on 05/27/2020) 30 tablet 0  . oxycodone (OXY-IR) 5 MG capsule Take 1 capsule (5 mg total) by mouth every 4 (four) hours as needed. (Patient not taking: Reported on 05/27/2020) 20 capsule 0  . oxyCODONE-acetaminophen (PERCOCET/ROXICET)  5-325 MG per tablet Take 1-2 tablets by mouth every 6 (six) hours as needed for severe pain. (Patient not taking: Reported on 05/27/2020) 20 tablet 0  . risperiDONE (RISPERDAL) 1 MG tablet Take 1 tablet (1 mg total) by mouth in the morning. 30 tablet 1  . risperiDONE (RISPERDAL) 2 MG tablet Take 1 tablet (2 mg total) by mouth at bedtime. 30 tablet 1  . traZODone (DESYREL) 150 MG tablet Take 1 tablet (150 mg total) by mouth at bedtime as needed for sleep. 30 tablet 1  . triamcinolone cream (KENALOG) 0.1 % Apply 1 application topically 2 (two) times daily. (Patient not taking: Reported on 05/27/2020)     No current facility-administered medications for this visit.     Musculoskeletal: Strength & Muscle Tone: within normal limits Gait & Station: normal Patient leans: N/A  Psychiatric Specialty Exam: Review of Systems  Psychiatric/Behavioral: Positive for dysphoric mood and sleep disturbance. Negative for decreased concentration, hallucinations, self-injury and suicidal ideas. The patient is nervous/anxious. The patient is not hyperactive.     Blood pressure (!) 133/107, pulse 70, temperature 98.1 F (36.7 C), temperature source Oral, height 5\' 6"  (1.676 m), SpO2 99 %.Body mass index is 32.77 kg/m.  General Appearance: Fairly Groomed and Neat  Eye Contact:  Good  Speech:  Clear and Coherent and Normal Rate  Volume:  Normal  Mood:  Anxious and Euthymic  Affect:  Appropriate and Congruent  Thought Process:  Coherent, Goal Directed and Descriptions of Associations: Intact  Orientation:  Full (Time, Place, and Person)  Thought Content: WDL and Logical   Suicidal Thoughts:  No  Homicidal Thoughts:  No  Memory:  Immediate;   Good Recent;   Good Remote;   Good  Judgement:  Good  Insight:  Fair  Psychomotor Activity:  Restlessness  Concentration:  Concentration: Good and Attention Span: Good  Recall:  Good  Fund of Knowledge: Good  Language: Good  Akathisia:  NA  Handed:  Right  AIMS  (if indicated): not done  Assets:  Communication Skills Desire for Improvement Housing  ADL's:  Intact  Cognition: WNL  Sleep:  Fair   Screenings: PHQ2-9     Counselor from 05/27/2020 in [facility]  PHQ-2 Total Score 6  PHQ-9 Total Score 20       Assessment and Plan:   [name] is a 49 year old female with a past psychiatric history significant for PTSD, schizoaffective disorder (bipolar type), and insomnia who presents to [facility] for medication management. Patient reports experiencing panic attack and sleep disturbances/intermittent sleep for the past two nights. Patient attributes panic attacks and sleep disturbances to the stressors of having to take care of her father and his affairs. Patient is interested in being placed on Klonopin for the management of her anxiety/panic attacks. Patient has been on hydroxyzine and buspirone in the past with little to no success. Patient was advised about the addictive potential of Klonopin. Patient reports having been on Klonopin and is aware of the abuse potential.  Patient was recommended to increase trazodone dosage from 100 mg to 150 mg. Patient was agreeable to recommendation. Patient was also recommended in add a 1 mg daily dose to her  Risperidone regimen to help improve mood. Patient was agreeable to recommendation. Cogentin 0.5 mg daily was also added to patient's medication regimen to help manage any extrapyramidal symptoms experienced from the Risperidone. Patient's medications will be e-prescribed to pharmacy of choice.  1. Schizoaffective disorder, bipolar type (HCC)  - risperiDONE (RISPERDAL) 1 MG tablet; Take 1 tablet (1 mg total) by mouth in the morning.  Dispense: 30 tablet; Refill: 1 - risperiDONE (RISPERDAL) 2 MG tablet; Take 1 tablet (2 mg total) by mouth at bedtime.  Dispense: 30 tablet; Refill: 1 - benztropine (COGENTIN) 0.5 MG tablet; Take 1 tablet  (0.5 mg total) by mouth 2 (two) times daily.  Dispense: 60 tablet; Refill: 1  2. PTSD (post-traumatic stress disorder)  - escitalopram (LEXAPRO) 20 MG tablet; Take 1 tablet (20 mg total) by mouth daily.  Dispense: 30 tablet; Refill: 1  3. Insomnia due to other mental disorder  - traZODone (DESYREL) 150 MG tablet; Take 1 tablet (150 mg total) by mouth at bedtime as needed for sleep.  Dispense: 30 tablet; Refill: 1  4. Panic attacks  - clonazePAM (KLONOPIN) 1 MG tablet; Take 1 tablet (1 mg total) by mouth 2 (two) times daily as needed for anxiety.  Dispense: 30 tablet; Refill: 0 - clonazePAM (KLONOPIN) 1 MG tablet; Take 1 tablet (1 mg total) by mouth 2 (two) times daily as needed for anxiety.  Dispense: 30 tablet; Refill: 0  Patient to follow up in 6 weeks  Meta Hatchet, PA 06/29/2020, 9:50 PM

## 2020-06-30 ENCOUNTER — Telehealth (HOSPITAL_COMMUNITY): Payer: Self-pay | Admitting: *Deleted

## 2020-06-30 MED ORDER — CLONAZEPAM 1 MG PO TABS: 1.0000 mg | ORAL_TABLET | Freq: Two times a day (BID) | ORAL | 0 refills | Status: DC | PRN

## 2020-06-30 NOTE — Telephone Encounter (Signed)
Patient was contacted this morning for information regarding correct pharmacy for medication to be sent to. Patient was informed that medication would be sent to correct pharmacy upon conclusion of the phone call.

## 2020-06-30 NOTE — Telephone Encounter (Signed)
Call from patient asking to clarify her Klonopin order, she only picked up 30 pills but the sig is for one twice a day. Will bring this to providers attention, order does state one twice a day. Will need to call in a second Rx in 15 days to get her thru the month.

## 2020-06-30 NOTE — Telephone Encounter (Signed)
Prescription for a 30-day supply of Klonopin 1 mg 2 x daily PRN for the management of panic attacks was preemptively written for the day of 07/15/2020. Will inform patient that her next prescription will be ready for her to pick up on the aforementioned date.

## 2020-07-12 MED FILL — TRAZODONE HCL 150 MG TABS: 150 | 30 days supply | Qty: 30 | Fill #0

## 2020-07-12 MED FILL — risperiDONE 2 MG TABS: 2 | 30 days supply | Qty: 30 | Fill #0

## 2020-07-12 MED FILL — ESCITALOPRAM 20 MG TABLET: 20 | 30 days supply | Qty: 30 | Fill #0

## 2020-07-27 ENCOUNTER — Telehealth (HOSPITAL_COMMUNITY): Payer: Self-pay | Admitting: *Deleted

## 2020-07-27 NOTE — Telephone Encounter (Signed)
Patient was contacted to confirm when she would run out of her current prescription of Klonopin. Confirmed with patient that she would be out of her Klonopin medication by 07/31/2020. Informed patient that Clinical research associate will call pharmacy of choice to refill prescription upon conclusion of the encounter.  Patient's pharmacy was contacted and a prescription for Klonopin 1 mg PO 2 times daily as needed Qty: 60 pills was called in. Patient will be able to pick up new prescription by 07/31/2020.

## 2020-07-27 NOTE — Telephone Encounter (Signed)
VM left for writer re her Rx for Klonopin she is nearly out of. She should be out on 07/31/20. Will ask provider to call it in if they agree. Her follow up appt is 08/09/20.

## 2020-08-01 MED FILL — risperiDONE 1 MG TABS: 1 | 30 days supply | Qty: 30 | Fill #1

## 2020-08-08 ENCOUNTER — Encounter (HOSPITAL_COMMUNITY): Payer: Self-pay | Admitting: Physician Assistant

## 2020-08-08 MED FILL — TRAZODONE HCL 150 MG TABS: 150 | 30 days supply | Qty: 30 | Fill #1

## 2020-08-08 MED FILL — risperiDONE 2 MG TABS: 2 | 30 days supply | Qty: 30 | Fill #1

## 2020-08-08 MED FILL — ESCITALOPRAM 20 MG TABLET: 20 | 30 days supply | Qty: 30 | Fill #1

## 2020-08-09 ENCOUNTER — Telehealth (INDEPENDENT_AMBULATORY_CARE_PROVIDER_SITE_OTHER): Payer: No Payment, Other | Admitting: Physician Assistant

## 2020-08-09 ENCOUNTER — Encounter (HOSPITAL_COMMUNITY): Payer: Self-pay | Admitting: Physician Assistant

## 2020-08-09 ENCOUNTER — Other Ambulatory Visit: Payer: Self-pay

## 2020-08-09 ENCOUNTER — Other Ambulatory Visit (HOSPITAL_COMMUNITY): Payer: Self-pay | Admitting: Physician Assistant

## 2020-08-09 DIAGNOSIS — F99 Mental disorder, not otherwise specified: Secondary | ICD-10-CM

## 2020-08-09 DIAGNOSIS — F410 Panic disorder [episodic paroxysmal anxiety] without agoraphobia: Secondary | ICD-10-CM

## 2020-08-09 DIAGNOSIS — F5105 Insomnia due to other mental disorder: Secondary | ICD-10-CM

## 2020-08-09 DIAGNOSIS — F250 Schizoaffective disorder, bipolar type: Secondary | ICD-10-CM | POA: Diagnosis not present

## 2020-08-09 DIAGNOSIS — F4310 Post-traumatic stress disorder, unspecified: Secondary | ICD-10-CM | POA: Diagnosis not present

## 2020-08-09 MED ORDER — TRAZODONE HCL 50 MG PO TABS: 50.0000 mg | ORAL_TABLET | Freq: Every day | ORAL | 1 refills | Status: DC

## 2020-08-09 MED ORDER — CLONAZEPAM 1 MG PO TABS: 1.0000 mg | ORAL_TABLET | Freq: Two times a day (BID) | ORAL | 0 refills | Status: DC | PRN

## 2020-08-09 MED ORDER — ESCITALOPRAM OXALATE 10 MG PO TABS: 30.0000 mg | ORAL_TABLET | Freq: Every day | ORAL | 1 refills | Status: DC

## 2020-08-09 MED FILL — traZODone HCL 50 MG TABS: 50 | 30 days supply | Qty: 30 | Fill #0

## 2020-08-09 MED FILL — ESCITALOPRAM 10 MG TABLET: 10 | 30 days supply | Qty: 90 | Fill #0

## 2020-08-09 NOTE — Progress Notes (Signed)
BH MD/PA/NP OP Progress Note  Virtual Visit via Telephone Note  I connected with [name] on 08/09/20 at  2:00 PM EST by telephone and verified that I am speaking with the correct person using two identifiers.  Location: Patient: Home Provider: Clinic   I discussed the limitations, risks, security and privacy concerns of performing an evaluation and management service by telephone and the availability of in person appointments. I also discussed with the patient that there may be a patient responsible charge related to this service. The patient expressed understanding and agreed to proceed.  Follow Up Instructions:   I discussed the assessment and treatment plan with the patient. The patient was provided an opportunity to ask questions and all were answered. The patient agreed with the plan and demonstrated an understanding of the instructions.   The patient was advised to call back or seek an in-person evaluation if the symptoms worsen or if the condition fails to improve as anticipated.  I provided 29 minutes of non-face-to-face time during this encounter.   Meta Hatchet, PA   08/09/2020 2:50 PM [name]  MRN:  [id number]  Chief Complaint: Follow-up and medication management  HPI:  [name] is a 50 year old female with a past psychiatric history significant for PTSD, schizoaffective disorder (bipolar type), panic attacks, and insomnia presents to [facility] virtual telephone visit for follow-up and medication management.  Patient is currently being managed on the following medications:  Risperidone 1 mg in the morning/2 mg at bedtime Escitalopram 20 mg 1 time daily Trazodone 150 mg at bedtime Clonazepam 1 mg 2 times daily as needed Benztropine 0.5 mg 2 times daily  Patient states that things have been better lately however she is experiencing some symptoms related to depression.  Patient endorses the following symptoms:  Decreased energy, decreased motivation, and episodes of sadness.  Although patient reports episodes of sadness, she reports that her mood becomes more improved by the afternoon.  Patient states that these changes started occurring last week.  Patient also endorses intermittent sleep and reports waking up multiple times within the night.  In addition to sleep disturbances, she reports that it takes her a while to go to sleep.  Since changing her dosage of trazodone from 100 mg to 150 mg at bedtime, patient feels like she is waking more from the higher dose than when she was taking the lower dose of trazodone.  Patient reports waking up to 4 times a night and is often not very alert when first waking.    Patient is interested in increasing her dosage of Lexapro from 20 mg to 30 mg and her trazodone dosage from 150 mg to 200 mg.  Patient is also requesting a refill on her Klonopin before her current prescription runs out.  Per past telephone encounter note, patient's latest Klonopin prescription was called into pharmacy on 07/31/2020 for a 30-day supply.  Date was confirmed with patient and writer will be prescribe new klonopin prescription 30 days after the previous Klonopin prescription date.  Patient has no other issues or concerns at this time.  Patient is pleasant, calm, cooperative, and engaged in conversation during the encounter.  Patient reports that her mood is fine and that she does not irritate quite as easily anymore.  Patient denies suicidal and homicidal ideations.  She further denies auditory or visual hallucinations.  Patient endorses fair sleep and reports waking up multiple times throughout the night.  She states that she wakes  up at least 3 - 4 times in the middle of the night.  Patient denies decreased appetite and states that she is not very hungry, however, she eats on average 3 meals a day.  Patient denies alcohol consumption and illicit drug use.  Patient endorses tobacco use but states that  she has cut down a bit from her usual amount.  Visit Diagnosis:    ICD-10-CM   1. Schizoaffective disorder, bipolar type (HCC)  F25.0 escitalopram (LEXAPRO) 10 MG tablet  2. PTSD (post-traumatic stress disorder)  F43.10 escitalopram (LEXAPRO) 10 MG tablet  3. Insomnia due to other mental disorder  F51.05 traZODone (DESYREL) 50 MG tablet   F99 escitalopram (LEXAPRO) 10 MG tablet  4. Panic attacks  F41.0 clonazePAM (KLONOPIN) 1 MG tablet    clonazePAM (KLONOPIN) 1 MG tablet    escitalopram (LEXAPRO) 10 MG tablet    Past Psychiatric History: Schizoaffective, bipolar type Insomnia Anxiety PTSD Panic attacks  Past Medical History:  Past Medical History:  Diagnosis Date  . Alcohol abuse   . Anxiety   . Arthritis   . Bipolar disorder (HCC)   . Depression   . GERD (gastroesophageal reflux disease)   . H/O hiatal hernia   . Hemorrhoids   . Small bowel obstruction (HCC) 2012  . Wolf-Parkinson-White syndrome   . Wolf-Parkinson-White syndrome    per patient    Past Surgical History:  Procedure Laterality Date  . ESOPHAGOGASTRODUODENOSCOPY  06/07/2011   Procedure: ESOPHAGOGASTRODUODENOSCOPY (EGD);  Surgeon: Rob Bunting, MD;  Location: [facility];  Service: Endoscopy;  Laterality: N/A;  . TUBAL LIGATION      Family Psychiatric History: Grandfather's sister has schizophrenia Grandmother's brother has a mental illness of some sort  Family History:  Family History  Problem Relation Age of Onset  . Breast cancer Maternal Aunt   . Colon polyps Maternal Grandmother   . Diabetes Maternal Grandmother   . Heart attack Maternal Grandmother   . Kidney cancer Maternal Grandmother   . Breast cancer Maternal Grandmother   . Colon cancer Neg Hx   . Anesthesia problems Neg Hx   . Hypotension Neg Hx   . Malignant hyperthermia Neg Hx   . Pseudochol deficiency Neg Hx   . Heart disease Father   . Heart disease Paternal Grandfather   . Heart disease Paternal Grandmother     Social  History:  Social History   Socioeconomic History  . Marital status: Single    Spouse name: Not on file  . Number of children: 4  . Years of education: Not on file  . Highest education level: Not on file  Occupational History  . Occupation: house wife  Tobacco Use  . Smoking status: Current Every Day Smoker    Packs/day: 1.00    Types: Cigarettes  . Smokeless tobacco: Never Used  Substance and Sexual Activity  . Alcohol use: No    Comment: previous alcohol abuse but for last 2 weeks 2 drinks. not currently drinking alcohol  . Drug use: No  . Sexual activity: Not Currently    Birth control/protection: None  Other Topics Concern  . Not on file  Social History Narrative  . Not on file   Social Determinants of Health   Financial Resource Strain: Medium Risk  . Difficulty of Paying Living Expenses: Somewhat hard  Food Insecurity: No Food Insecurity  . Worried About Programme researcher, broadcasting/film/video in the Last Year: Never true  . Ran Out of Food in the Last Year: Never  true  Transportation Needs: No Transportation Needs  . Lack of Transportation (Medical): No  . Lack of Transportation (Non-Medical): No  Physical Activity: Inactive  . Days of Exercise per Week: 0 days  . Minutes of Exercise per Session: 0 min  Stress: Stress Concern Present  . Feeling of Stress : Very much  Social Connections: Moderately Isolated  . Frequency of Communication with Friends and Family: More than three times a week  . Frequency of Social Gatherings with Friends and Family: Once a week  . Attends Religious Services: Never  . Active Member of Clubs or Organizations: No  . Attends BankerClub or Organization Meetings: Never  . Marital Status: Married    Allergies:  Allergies  Allergen Reactions  . Codeine Nausea Only    Metabolic Disorder Labs: No results found for: HGBA1C, MPG No results found for: PROLACTIN Lab Results  Component Value Date   CHOL  01/03/2009    147        ATP III CLASSIFICATION:   <200     mg/dL   Desirable  [id number]  mg/dL   Borderline High  >=045>=240    mg/dL   High          TRIG [id number] 01/03/2009   HDL 19 (L) 01/03/2009   CHOLHDL 7.7 01/03/2009   VLDL 28 01/03/2009   LDLCALC (H) 01/03/2009    100        Total Cholesterol/HDL:CHD Risk Coronary Heart Disease Risk Table                     Men   Women  1/2 Average Risk   3.4   3.3  Average Risk       5.0   4.4  2 X Average Risk   9.6   7.1  3 X Average Risk  23.4   11.0        Use the calculated Patient Ratio above and the CHD Risk Table to determine the patient's CHD Risk.        ATP III CLASSIFICATION (LDL):  <100     mg/dL   Optimal  [id number]  mg/dL   Near or Above                    Optimal  130-159  mg/dL   Borderline  [id number]  mg/dL   High  >213>190     mg/dL   Very High     Therapeutic Level Labs: No results found for: LITHIUM No results found for: VALPROATE No components found for:  CBMZ  Current Medications: Current Outpatient Medications  Medication Sig Dispense Refill  . [START ON 08/30/2020] clonazePAM (KLONOPIN) 1 MG tablet Take 1 tablet (1 mg total) by mouth 2 (two) times daily as needed for anxiety. 60 tablet 0  . [START ON 09/29/2020] clonazePAM (KLONOPIN) 1 MG tablet Take 1 tablet (1 mg total) by mouth 2 (two) times daily as needed for anxiety. 60 tablet 0  . albuterol (PROVENTIL HFA;VENTOLIN HFA) 108 (90 BASE) MCG/ACT inhaler Inhale 2 puffs into the lungs every 6 (six) hours as needed for shortness of breath (panic attacks). (Patient not taking: Reported on 05/27/2020)    . amphetamine-dextroamphetamine (ADDERALL XR) 30 MG 24 hr capsule Take 30 mg by mouth 2 (two) times daily. (Patient not taking: Reported on 05/27/2020)    . amphetamine-dextroamphetamine (ADDERALL) 10 MG tablet Take 10 mg by mouth daily with breakfast. (Patient not taking: Reported on 05/27/2020)    .  benztropine (COGENTIN) 0.5 MG tablet Take 1 tablet (0.5 mg total) by mouth 2 (two) times daily. 60 tablet 1  . cyclobenzaprine  (FLEXERIL) 10 MG tablet Take 1 tablet (10 mg total) by mouth 2 (two) times daily as needed for muscle spasms. (Patient not taking: Reported on 05/27/2020) 20 tablet 0  . diclofenac (VOLTAREN) 75 MG EC tablet Take 75 mg by mouth 2 (two) times daily. (Patient not taking: Reported on 05/27/2020)    . escitalopram (LEXAPRO) 10 MG tablet Take 3 tablets (30 mg total) by mouth daily. 90 tablet 1  . escitalopram (LEXAPRO) 20 MG tablet Take 1 tablet (20 mg total) by mouth daily. 30 tablet 1  . esomeprazole (NEXIUM) 20 MG capsule Take 20 mg by mouth daily before breakfast. (Patient not taking: Reported on 05/27/2020)    . HYDROcodone-acetaminophen (NORCO/VICODIN) 5-325 MG per tablet Take 1 tablet by mouth every 4 (four) hours as needed for moderate pain or severe pain. (Patient not taking: Reported on 05/27/2020) 4 tablet 0  . ibuprofen (ADVIL,MOTRIN) 800 MG tablet Take 1 tablet (800 mg total) by mouth 3 (three) times daily. (Patient not taking: Reported on 05/27/2020) 21 tablet 0  . methocarbamol (ROBAXIN) 500 MG tablet Take 1 tablet (500 mg total) by mouth 2 (two) times daily. (Patient not taking: Reported on 05/27/2020) 20 tablet 0  . methocarbamol (ROBAXIN) 500 MG tablet Take 1 tablet (500 mg total) by mouth 2 (two) times daily. (Patient not taking: Reported on 05/27/2020) 20 tablet 0  . naproxen (NAPROSYN) 500 MG tablet Take 1 tablet (500 mg total) by mouth 2 (two) times daily. (Patient not taking: Reported on 05/27/2020) 30 tablet 0  . oxycodone (OXY-IR) 5 MG capsule Take 1 capsule (5 mg total) by mouth every 4 (four) hours as needed. (Patient not taking: Reported on 05/27/2020) 20 capsule 0  . oxyCODONE-acetaminophen (PERCOCET/ROXICET) 5-325 MG per tablet Take 1-2 tablets by mouth every 6 (six) hours as needed for severe pain. (Patient not taking: Reported on 05/27/2020) 20 tablet 0  . risperiDONE (RISPERDAL) 1 MG tablet Take 1 tablet (1 mg total) by mouth in the morning. 30 tablet 1  . risperiDONE  (RISPERDAL) 2 MG tablet Take 1 tablet (2 mg total) by mouth at bedtime. 30 tablet 1  . traZODone (DESYREL) 150 MG tablet Take 1 tablet (150 mg total) by mouth at bedtime as needed for sleep. 30 tablet 1  . traZODone (DESYREL) 50 MG tablet Take 1 tablet (50 mg total) by mouth at bedtime. To be added to previous trazodone rx so that patient is able to take 200 mg total at bedtime 30 tablet 1  . triamcinolone cream (KENALOG) 0.1 % Apply 1 application topically 2 (two) times daily. (Patient not taking: Reported on 05/27/2020)     No current facility-administered medications for this visit.     Musculoskeletal: Strength & Muscle Tone: Unable to assess due to telemedicine visit Gait & Station: Unable to assess due to telemedicine visit Patient leans: Unable to assess due to telemedicine visit  Psychiatric Specialty Exam: Review of Systems  Psychiatric/Behavioral: Positive for sleep disturbance. Negative for decreased concentration, dysphoric mood, hallucinations and suicidal ideas. The patient is not nervous/anxious.        Patient reports decreased energy, decreased motivation, and depressed mood.    There were no vitals taken for this visit.There is no height or weight on file to calculate BMI.  General Appearance: Unable to assess due to telemedicine visit  Eye Contact:  Unable to  assess due to telemedicine visit  Speech:  Clear and Coherent and Normal Rate  Volume:  Normal  Mood:  Euthymic  Affect:  Appropriate  Thought Process:  Coherent, Goal Directed and Descriptions of Associations: Intact  Orientation:  Full (Time, Place, and Person)  Thought Content: WDL and Logical   Suicidal Thoughts:  No  Homicidal Thoughts:  No  Memory:  Immediate;   Good Recent;   Good Remote;   Fair  Judgement:  Good  Insight:  Fair  Psychomotor Activity:  Normal  Concentration:  Concentration: Good and Attention Span: Good  Recall:  Good  Fund of Knowledge: Good  Language: Good  Akathisia:  NA   Handed:  Right  AIMS (if indicated): not done  Assets:  Communication Skills Desire for Improvement Housing  ADL's:  Intact  Cognition: WNL  Sleep:  Fair   Screenings: Administrator, sportsHQ2-9   Flow[facility] Row [facility]  PHQ-2 Total Score 6  PHQ-9 Total Score 20       Assessment and Plan:  [name] is a 50 year old female with a past psychiatric history significant for PTSD, schizoaffective disorder (bipolar type), panic attacks, and insomnia [facility] for follow-up and medication management.  Patient reports the following depressive symptoms: decreased energy, decreased motivation, and depressed mood.  She also endorses intermittent sleep and reports waking up multiple times in the night and difficulty falling back to sleep.  Patient states that she wakes up at least 3 times a night. Patient is interested in increasing her dosage of Lexapro from 20 mg to 30 mg and her trazodone dosage from 150 mg to 200 mg. Writer will e-prescribe medications to pharmacy of choice.  Patient is also requesting a refill on her Klonopin before her current prescription runs out.  Per past telephone encounter note, patient's latest Klonopin prescription was called into pharmacy on 07/31/2020 for a 30-day supply.  Date was confirmed with patient and writer will be prescribe new klonopin prescription 30 days after the previous Klonopin prescription pickup date.  Next available date to pick up prescription for Klonopin: 08/30/2020 Subsequent date for Klonopin prescription availability: 09/29/2020  1. Schizoaffective disorder, bipolar type (HCC)  - escitalopram (LEXAPRO) 10 MG tablet; Take 3 tablets (30 mg total) by mouth daily.  Dispense: 90 tablet; Refill: 1  2. PTSD (post-traumatic stress disorder)  - escitalopram (LEXAPRO) 10 MG tablet; Take 3 tablets (30 mg total) by mouth daily.   Dispense: 90 tablet; Refill: 1  3. Insomnia due to other mental disorder  - traZODone (DESYREL) 50 MG tablet; Take 1 tablet (50 mg total) by mouth at bedtime. To be added to previous trazodone rx so that patient is able to take 200 mg total at bedtime  Dispense: 30 tablet; Refill: 1  4. Panic attacks  - clonazePAM (KLONOPIN) 1 MG tablet; Take 1 tablet (1 mg total) by mouth 2 (two) times daily as needed for anxiety.  Dispense: 60 tablet; Refill: 0 - clonazePAM (KLONOPIN) 1 MG tablet; Take 1 tablet (1 mg total) by mouth 2 (two) times daily as needed for anxiety.  Dispense: 60 tablet; Refill: 0 - escitalopram (LEXAPRO) 10 MG tablet; Take 3 tablets (30 mg total) by mouth daily.  Dispense: 90 tablet; Refill: 1  Patient to follow up in 6 weeks  Meta HatchetUchenna E Elyan Vanwieren, PA 08/09/2020, 2:50 PM

## 2020-08-30 ENCOUNTER — Telehealth (HOSPITAL_COMMUNITY): Payer: Self-pay | Admitting: *Deleted

## 2020-08-30 NOTE — Telephone Encounter (Signed)
VM left for writer re her Klonopin. Checked the pharmacy the [facility] indicates we called it into but they dont have a Klonopin Rx on hold for her and havent filled one. States they can see [facility] [address] [facility] ref[facility] request and filled it last. Called [facility] and she last filled Klonopin there on 07/27/20 but do not have any rx there on file or refills. Will reach out to the provider to clarify the Rx.

## 2020-08-30 NOTE — Telephone Encounter (Signed)
Writer was contacted by Wynona Luna, RN regarding patient's medications. Patient's Klonopin prescriptions were called into [facility] on [address].

## 2020-09-05 ENCOUNTER — Telehealth (HOSPITAL_COMMUNITY): Payer: Self-pay | Admitting: *Deleted

## 2020-09-05 MED FILL — BENZTROPINE MES 0.5 MG TAB: 0.5 | 30 days supply | Qty: 60 | Fill #1

## 2020-09-05 NOTE — Telephone Encounter (Signed)
Call from patient needing Rx's called in for her Risperdal and her Lexapro. She should be out. Reminded her of her appt on 2/23 at 1 pm. Will bring her request to [facility] attention as she will need a month to make it to her next appt.

## 2020-09-06 ENCOUNTER — Other Ambulatory Visit (HOSPITAL_COMMUNITY): Payer: Self-pay | Admitting: Physician Assistant

## 2020-09-06 DIAGNOSIS — F250 Schizoaffective disorder, bipolar type: Secondary | ICD-10-CM

## 2020-09-06 DIAGNOSIS — F4310 Post-traumatic stress disorder, unspecified: Secondary | ICD-10-CM

## 2020-09-06 DIAGNOSIS — F410 Panic disorder [episodic paroxysmal anxiety] without agoraphobia: Secondary | ICD-10-CM

## 2020-09-06 DIAGNOSIS — F5105 Insomnia due to other mental disorder: Secondary | ICD-10-CM

## 2020-09-06 MED ORDER — ESCITALOPRAM OXALATE 10 MG PO TABS: 30.0000 mg | ORAL_TABLET | Freq: Every day | ORAL | 1 refills | Status: DC

## 2020-09-06 MED ORDER — RISPERIDONE 2 MG PO TABS: 2.0000 mg | ORAL_TABLET | Freq: Every day | ORAL | 1 refills | Status: DC

## 2020-09-06 MED ORDER — RISPERIDONE 1 MG PO TABS: 1.0000 mg | ORAL_TABLET | Freq: Every morning | ORAL | 1 refills | Status: DC

## 2020-09-06 MED FILL — ESCITALOPRAM 10 MG TABLET: 10 | 30 days supply | Qty: 90 | Fill #0

## 2020-09-06 MED FILL — risperiDONE 2 MG TABS: 2 | 30 days supply | Qty: 30 | Fill #0

## 2020-09-06 MED FILL — risperiDONE 1 MG TABS: 1 | 30 days supply | Qty: 30 | Fill #0

## 2020-09-06 NOTE — Progress Notes (Signed)
Provider was contacted by Suzanne K Beck, RN regarding refill request for this patient. Patient's medications to be e-prescribed to pharmacy of choice.

## 2020-09-06 NOTE — Telephone Encounter (Signed)
Provider was contacted by Wynona Luna, RN regarding refill request for this patient. Patient's medications to be e-prescribed to pharmacy of choice.

## 2020-09-09 ENCOUNTER — Other Ambulatory Visit: Payer: Self-pay | Admitting: Pharmacy Technician

## 2020-09-09 MED FILL — traZODone HCL 50 MG TABS: 50 | 30 days supply | Qty: 30 | Fill #1

## 2020-09-12 ENCOUNTER — Other Ambulatory Visit (HOSPITAL_COMMUNITY): Payer: Self-pay | Admitting: Physician Assistant

## 2020-09-12 ENCOUNTER — Telehealth (HOSPITAL_COMMUNITY): Payer: Self-pay | Admitting: *Deleted

## 2020-09-12 DIAGNOSIS — F5105 Insomnia due to other mental disorder: Secondary | ICD-10-CM

## 2020-09-12 MED ORDER — TRAZODONE HCL 150 MG PO TABS
150.0000 mg | ORAL_TABLET | Freq: Every evening | ORAL | 1 refills | Status: DC | PRN
Start: 2020-09-12 — End: 2020-10-20

## 2020-09-12 MED FILL — TRAZODONE HCL 150 MG TABS: 150 | 30 days supply | Qty: 30 | Fill #0

## 2020-09-12 NOTE — Telephone Encounter (Signed)
Provider was contacted by [name], RMA regarding patient's medications. Patient is currently taking trazodone 200 mg at bedtime. Patient has been combining trazodone 50 mg and 150 mg to fulfill the 200 mg dose. Patient's medication to be e-prescribed to pharmacy of choice.

## 2020-09-12 NOTE — Progress Notes (Signed)
Provider was contacted by Direce E McIntyre, RMA regarding patient's medications. Patient is currently taking trazodone 200 mg at bedtime. Patient has been combining trazodone 50 mg and 150 mg to fulfill the 200 mg dose. Patient's medication to be e-prescribed to pharmacy of choice.

## 2020-09-12 NOTE — Telephone Encounter (Signed)
[facility] FAXED REFILL REQUEST traZODone (DESYREL) 150 MG tablet 30 tablet 1 06/29/2020    Sig - Route: Take 1 tablet (150 mg total) by mouth at bedtime as needed for sleep. - Oral

## 2020-09-15 MED FILL — risperiDONE 2 MG TABS: 2 | 30 days supply | Qty: 30 | Fill #0

## 2020-09-15 MED FILL — BENZTROPINE MES 0.5 MG TAB: 0.5 | 30 days supply | Qty: 60 | Fill #1

## 2020-09-21 ENCOUNTER — Other Ambulatory Visit: Payer: Self-pay

## 2020-09-21 ENCOUNTER — Telehealth (INDEPENDENT_AMBULATORY_CARE_PROVIDER_SITE_OTHER): Payer: No Payment, Other | Admitting: Physician Assistant

## 2020-09-21 ENCOUNTER — Encounter (HOSPITAL_COMMUNITY): Payer: Self-pay | Admitting: Physician Assistant

## 2020-09-21 DIAGNOSIS — F250 Schizoaffective disorder, bipolar type: Secondary | ICD-10-CM | POA: Diagnosis not present

## 2020-09-21 DIAGNOSIS — F5105 Insomnia due to other mental disorder: Secondary | ICD-10-CM

## 2020-09-21 DIAGNOSIS — F99 Mental disorder, not otherwise specified: Secondary | ICD-10-CM

## 2020-09-21 DIAGNOSIS — F410 Panic disorder [episodic paroxysmal anxiety] without agoraphobia: Secondary | ICD-10-CM | POA: Diagnosis not present

## 2020-09-21 DIAGNOSIS — F4310 Post-traumatic stress disorder, unspecified: Secondary | ICD-10-CM

## 2020-09-21 NOTE — Progress Notes (Signed)
BH MD/PA/NP OP Progress Note  Virtual Visit via Telephone Note  I connected with [name] on 09/21/20 at  1:00 PM EST by telephone and verified that I am speaking with the correct person using two identifiers.  Location: Patient: Home Provider: Clinic   I discussed the limitations, risks, security and privacy concerns of performing an evaluation and management service by telephone and the availability of in person appointments. I also discussed with the patient that there may be a patient responsible charge related to this service. The patient expressed understanding and agreed to proceed.  Follow Up Instructions:  I discussed the assessment and treatment plan with the patient. The patient was provided an opportunity to ask questions and all were answered. The patient agreed with the plan and demonstrated an understanding of the instructions.   The patient was advised to call back or seek an in-person evaluation if the symptoms worsen or if the condition fails to improve as anticipated.  I provided 15 minutes of non-face-to-face time during this encounter.  Meta Hatchet, PA   09/21/2020 1:24 PM [name]  MRN:  [id number]  Chief Complaint: Follow up and medication management  HPI:   [name] is a 50 year old female with a past psychiatric history significant for schizoaffective (bipolar type), insomnia, generalized anxiety disorder, PTSD, and panic disorder who presents to [facility] for follow-up and medication management.  Patient is currently being managed on the following medications:  Escitalopram 30 mg daily Trazodone 200 mg at bedtime Clonazepam 1 mg 2 times daily as needed Risperidone 1 mg in the morning/2 mg at bedtime Benztropine 0.5 mg 2 times daily  Patient denies issues or concerns with her current medication regimen.  Patient reports that she has not been experiencing any depressive symptoms since her dosage  adjustment of escitalopram from 20 mg to 30 mg daily.  Patient denies any life-changing events or new stressors.  A GAD-7 screen was performed with the patient scoring day 12 indicative of moderate anxiety.  Patient is pleasant, calm, cooperative, and fully engaged in conversation during the encounter.  Patient reports that she is doing okay and does not feel depressed.  Patient denies suicidal or homicidal ideations.  She further denies auditory or visual hallucinations.  Patient endorses fair sleep and receives on average 5 to 6 hours of sleep each night.  Patient reports that this amount of sleep is much better in comparison to how much sleep she was getting in the past.  Patient endorses appetite and states that she will nibble on things throughout the day but at night she will find herself being drawn to sweeter items.  Patient expresses no concerns with the recent change in her food cravings.  Patient denies alcohol consumption and illicit drug use.  Patient endorses tobacco use and smokes a pack per day.  Visit Diagnosis:    ICD-10-CM   1. Insomnia due to other mental disorder  F51.05    F99   2. Schizoaffective disorder, bipolar type (HCC)  F25.0   3. PTSD (post-traumatic stress disorder)  F43.10   4. Panic attacks  F41.0     Past Psychiatric History:  Schizoaffective,bipolar type Insomnia Anxiety PTSD Panic attacks  Past Medical History:  Past Medical History:  Diagnosis Date  . Alcohol abuse   . Anxiety   . Arthritis   . Bipolar disorder (HCC)   . Depression   . GERD (gastroesophageal reflux disease)   . H/O hiatal hernia   .  Hemorrhoids   . Small bowel obstruction (HCC) 2012  . Wolf-Parkinson-Lasandra Batley syndrome   . Wolf-Parkinson-Cesario Weidinger syndrome    per patient    Past Surgical History:  Procedure Laterality Date  . ESOPHAGOGASTRODUODENOSCOPY  06/07/2011   Procedure: ESOPHAGOGASTRODUODENOSCOPY (EGD);  Surgeon: Rob Buntinganiel Jacobs, MD;  Location: [facility];  Service:  Endoscopy;  Laterality: N/A;  . TUBAL LIGATION      Family Psychiatric History:  Grandfather's sister has schizophrenia Grandmother's brother has a mental illness of some sort  Family History:  Family History  Problem Relation Age of Onset  . Breast cancer Maternal Aunt   . Colon polyps Maternal Grandmother   . Diabetes Maternal Grandmother   . Heart attack Maternal Grandmother   . Kidney cancer Maternal Grandmother   . Breast cancer Maternal Grandmother   . Colon cancer Neg Hx   . Anesthesia problems Neg Hx   . Hypotension Neg Hx   . Malignant hyperthermia Neg Hx   . Pseudochol deficiency Neg Hx   . Heart disease Father   . Heart disease Paternal Grandfather   . Heart disease Paternal Grandmother     Social History:  Social History   Socioeconomic History  . Marital status: Single    Spouse name: Not on file  . Number of children: 4  . Years of education: Not on file  . Highest education level: Not on file  Occupational History  . Occupation: house wife  Tobacco Use  . Smoking status: Current Every Day Smoker    Packs/day: 1.00    Types: Cigarettes  . Smokeless tobacco: Never Used  Substance and Sexual Activity  . Alcohol use: No    Comment: previous alcohol abuse but for last 2 weeks 2 drinks. not currently drinking alcohol  . Drug use: No  . Sexual activity: Not Currently    Birth control/protection: None  Other Topics Concern  . Not on file  Social History Narrative  . Not on file   Social Determinants of Health   Financial Resource Strain: Medium Risk  . Difficulty of Paying Living Expenses: Somewhat hard  Food Insecurity: No Food Insecurity  . Worried About Programme researcher, broadcasting/film/videounning Out of Food in the Last Year: Never true  . Ran Out of Food in the Last Year: Never true  Transportation Needs: No Transportation Needs  . Lack of Transportation (Medical): No  . Lack of Transportation (Non-Medical): No  Physical Activity: Inactive  . Days of Exercise per Week: 0 days   . Minutes of Exercise per Session: 0 min  Stress: Stress Concern Present  . Feeling of Stress : Very much  Social Connections: Moderately Isolated  . Frequency of Communication with Friends and Family: More than three times a week  . Frequency of Social Gatherings with Friends and Family: Once a week  . Attends Religious Services: Never  . Active Member of Clubs or Organizations: No  . Attends BankerClub or Organization Meetings: Never  . Marital Status: Married    Allergies:  Allergies  Allergen Reactions  . Codeine Nausea Only    Metabolic Disorder Labs: No results found for: HGBA1C, MPG No results found for: PROLACTIN Lab Results  Component Value Date   CHOL  01/03/2009    147        ATP III CLASSIFICATION:  <200     mg/dL   Desirable  161-096200-239  mg/dL   Borderline High  >=045>=240    mg/dL   High  TRIG 141 01/03/2009   HDL 19 (L) 01/03/2009   CHOLHDL 7.7 01/03/2009   VLDL 28 01/03/2009   LDLCALC (H) 01/03/2009    100        Total Cholesterol/HDL:CHD Risk Coronary Heart Disease Risk Table                     Men   Women  1/2 Average Risk   3.4   3.3  Average Risk       5.0   4.4  2 X Average Risk   9.6   7.1  3 X Average Risk  23.4   11.0        Use the calculated Patient Ratio above and the CHD Risk Table to determine the patient's CHD Risk.        ATP III CLASSIFICATION (LDL):  <100     mg/dL   Optimal  300-923  mg/dL   Near or Above                    Optimal  130-159  mg/dL   Borderline  300-762  mg/dL   High  >263     mg/dL   Very High   Lab Results  Component Value Date   TSH 3.259 Test methodology is 3rd generation TSH 01/03/2009    Therapeutic Level Labs: No results found for: LITHIUM No results found for: VALPROATE No components found for:  CBMZ  Current Medications: Current Outpatient Medications  Medication Sig Dispense Refill  . albuterol (PROVENTIL HFA;VENTOLIN HFA) 108 (90 BASE) MCG/ACT inhaler Inhale 2 puffs into the lungs every 6  (six) hours as needed for shortness of breath (panic attacks). (Patient not taking: Reported on 05/27/2020)    . amphetamine-dextroamphetamine (ADDERALL XR) 30 MG 24 hr capsule Take 30 mg by mouth 2 (two) times daily. (Patient not taking: Reported on 05/27/2020)    . amphetamine-dextroamphetamine (ADDERALL) 10 MG tablet Take 10 mg by mouth daily with breakfast. (Patient not taking: Reported on 05/27/2020)    . benztropine (COGENTIN) 0.5 MG tablet Take 1 tablet (0.5 mg total) by mouth 2 (two) times daily. 60 tablet 1  . clonazePAM (KLONOPIN) 1 MG tablet Take 1 tablet (1 mg total) by mouth 2 (two) times daily as needed for anxiety. 60 tablet 0  . [START ON 09/29/2020] clonazePAM (KLONOPIN) 1 MG tablet Take 1 tablet (1 mg total) by mouth 2 (two) times daily as needed for anxiety. 60 tablet 0  . cyclobenzaprine (FLEXERIL) 10 MG tablet Take 1 tablet (10 mg total) by mouth 2 (two) times daily as needed for muscle spasms. (Patient not taking: Reported on 05/27/2020) 20 tablet 0  . diclofenac (VOLTAREN) 75 MG EC tablet Take 75 mg by mouth 2 (two) times daily. (Patient not taking: Reported on 05/27/2020)    . escitalopram (LEXAPRO) 10 MG tablet Take 3 tablets (30 mg total) by mouth daily. 90 tablet 1  . esomeprazole (NEXIUM) 20 MG capsule Take 20 mg by mouth daily before breakfast. (Patient not taking: Reported on 05/27/2020)    . HYDROcodone-acetaminophen (NORCO/VICODIN) 5-325 MG per tablet Take 1 tablet by mouth every 4 (four) hours as needed for moderate pain or severe pain. (Patient not taking: Reported on 05/27/2020) 4 tablet 0  . ibuprofen (ADVIL,MOTRIN) 800 MG tablet Take 1 tablet (800 mg total) by mouth 3 (three) times daily. (Patient not taking: Reported on 05/27/2020) 21 tablet 0  . methocarbamol (ROBAXIN) 500 MG tablet Take 1 tablet (500  mg total) by mouth 2 (two) times daily. (Patient not taking: Reported on 05/27/2020) 20 tablet 0  . methocarbamol (ROBAXIN) 500 MG tablet Take 1 tablet (500 mg total)  by mouth 2 (two) times daily. (Patient not taking: Reported on 05/27/2020) 20 tablet 0  . naproxen (NAPROSYN) 500 MG tablet Take 1 tablet (500 mg total) by mouth 2 (two) times daily. (Patient not taking: Reported on 05/27/2020) 30 tablet 0  . oxycodone (OXY-IR) 5 MG capsule Take 1 capsule (5 mg total) by mouth every 4 (four) hours as needed. (Patient not taking: Reported on 05/27/2020) 20 capsule 0  . oxyCODONE-acetaminophen (PERCOCET/ROXICET) 5-325 MG per tablet Take 1-2 tablets by mouth every 6 (six) hours as needed for severe pain. (Patient not taking: Reported on 05/27/2020) 20 tablet 0  . risperiDONE (RISPERDAL) 1 MG tablet Take 1 tablet (1 mg total) by mouth in the morning. 30 tablet 1  . risperiDONE (RISPERDAL) 2 MG tablet Take 1 tablet (2 mg total) by mouth at bedtime. 30 tablet 1  . traZODone (DESYREL) 150 MG tablet Take 1 tablet (150 mg total) by mouth at bedtime as needed for sleep. 30 tablet 1  . triamcinolone cream (KENALOG) 0.1 % Apply 1 application topically 2 (two) times daily. (Patient not taking: Reported on 05/27/2020)     No current facility-administered medications for this visit.     Musculoskeletal: Strength & Muscle Tone: Unable to assess due to telemedicine visit Gait & Station: Unable to assess due to telemedicine visit Patient leans: Unable to assess due to telemedicine visit  Psychiatric Specialty Exam: Review of Systems  Psychiatric/Behavioral: Negative for decreased concentration, dysphoric mood, hallucinations, self-injury, sleep disturbance and suicidal ideas. The patient is nervous/anxious. The patient is not hyperactive.     There were no vitals taken for this visit.There is no height or weight on file to calculate BMI.  General Appearance: Unable to assess due to telemedicine visit  Eye Contact:  Unable to assess due to telemedicine visit  Speech:  Clear and Coherent and Normal Rate  Volume:  Normal  Mood:  Anxious and Euthymic  Affect:  Appropriate and  Congruent  Thought Process:  Coherent and Descriptions of Associations: Intact  Orientation:  Full (Time, Place, and Person)  Thought Content: WDL   Suicidal Thoughts:  No  Homicidal Thoughts:  No  Memory:  Immediate;   Good Recent;   Good Remote;   Fair  Judgement:  Good  Insight:  Fair  Psychomotor Activity:  Normal  Concentration:  Concentration: Good and Attention Span: Good  Recall:  Good  Fund of Knowledge: Good  Language: Good  Akathisia:  NA  Handed:  Right  AIMS (if indicated): not done  Assets:  Communication Skills Desire for Improvement Housing  ADL's:  Intact  Cognition: WNL  Sleep:  Fair   Screenings: GAD-7   Flowsheet Row Video Visit from 09/21/2020 in [facility]  Total GAD-7 Score 12    PHQ2-9   Flowsheet Row Video Visit from 09/21/2020 in [facility] Counselor from 05/27/2020 in [facility]  PHQ-2 Total Score 1 6  PHQ-9 Total Score - 20    Flowsheet Row Video Visit from 09/21/2020 in [facility]  C-SSRS RISK CATEGORY No Risk       Assessment and Plan:  [name] is a 50 year old female with a past psychiatric history significant for schizoaffective (bipolar type), insomnia, generalized anxiety disorder, PTSD, and panic disorder  who presents to [facility] for follow-up and medication management. Patient denies issues or concerns with her current medication regimen.  Patient denies the need for dosage adjustments at this time and does not require any medication refills.  Patient denies depressive symptoms since her dosage adjustment with escitalopram.  Patient to continue taking her medications as scheduled.  1. Insomnia due to other mental disorder Patient to continue taking trazodone 200 mg at bedtime for the management of her insomnia  2. Schizoaffective disorder, bipolar type (HCC) Patient to  continue taking risperidone 1 mg in the morning/2 mg at bedtime for the management of her schizoaffective disorder Patient to continue taking benztropine 0.5 mg 2 times daily  3. PTSD (post-traumatic stress disorder) Patient to continue taking escitalopram 30 mg for the management of her PTSD  4. Panic attacks Patient to continue taking escitalopram 30 mg for the management of her panic attacks Patient to continue taking clonazepam 1 mg 2 times daily as needed for the management of her panic attacks.  Patient's next prescription for clonazepam will be available on 09/29/2020  Patient to follow-up in 6 weeks  Meta Hatchet, PA 09/21/2020, 1:24 PM

## 2020-10-06 MED FILL — risperiDONE 1 MG TABS: 1 | 30 days supply | Qty: 30 | Fill #1

## 2020-10-20 ENCOUNTER — Telehealth (HOSPITAL_COMMUNITY): Payer: Self-pay | Admitting: *Deleted

## 2020-10-20 ENCOUNTER — Other Ambulatory Visit (HOSPITAL_COMMUNITY): Payer: Self-pay | Admitting: Physician Assistant

## 2020-10-20 DIAGNOSIS — F99 Mental disorder, not otherwise specified: Secondary | ICD-10-CM

## 2020-10-20 DIAGNOSIS — F5105 Insomnia due to other mental disorder: Secondary | ICD-10-CM

## 2020-10-20 MED ORDER — TRAZODONE HCL 150 MG PO TABS: 150.0000 mg | ORAL_TABLET | Freq: Every evening | ORAL | 1 refills | Status: DC | PRN

## 2020-10-20 MED FILL — TRAZODONE HCL 150 MG TABS: 150 | 30 days supply | Qty: 30 | Fill #1

## 2020-10-20 NOTE — Progress Notes (Signed)
Provider was contacted by Direce E McIntyre, RMA regarding patient's medication refill request. Patient's medication will be e-prescribed to pharmacy of choice.

## 2020-10-20 NOTE — Telephone Encounter (Signed)
Provider was contacted by Rushie Chestnut, RMA regarding patient's medication refill request. Patient's medication will be e-prescribed to pharmacy of choice.

## 2020-10-20 NOTE — Telephone Encounter (Signed)
Refill Request: traZODone (DESYREL) 150 MG tablet   Next Appt-11/04/20.

## 2020-10-21 ENCOUNTER — Other Ambulatory Visit: Payer: Self-pay | Admitting: Pharmacy Technician

## 2020-10-21 MED FILL — risperiDONE 1 MG TABS: 1 | 30 days supply | Qty: 30 | Fill #1

## 2020-10-21 MED FILL — risperiDONE 2 MG TABS: 2 | 30 days supply | Qty: 30 | Fill #1

## 2020-10-31 ENCOUNTER — Telehealth (HOSPITAL_COMMUNITY): Payer: Self-pay

## 2020-10-31 ENCOUNTER — Other Ambulatory Visit (HOSPITAL_COMMUNITY): Payer: Self-pay | Admitting: Physician Assistant

## 2020-10-31 DIAGNOSIS — F410 Panic disorder [episodic paroxysmal anxiety] without agoraphobia: Secondary | ICD-10-CM

## 2020-10-31 MED ORDER — CLONAZEPAM 1 MG PO TABS: 1.0000 mg | ORAL_TABLET | Freq: Two times a day (BID) | ORAL | 0 refills | Status: DC | PRN

## 2020-10-31 NOTE — Progress Notes (Signed)
Provider was contacted by Alinda Money, CMA regarding medication refills for the patient. Patient's medication will be e-prescribed to pharmacy of choice.

## 2020-10-31 NOTE — Telephone Encounter (Signed)
Pt called requesting a refill on her Clonazepam 1mg  (we also received a fax from the pharmacy). [facility] to [facility] on [address] in [place]. Please review and advise. Thank you.

## 2020-10-31 NOTE — Telephone Encounter (Signed)
Patient's medication e-prescribed to pharmacy of choice. 

## 2020-11-01 NOTE — Telephone Encounter (Signed)
Pt picked up her Clonazepam 1mg 

## 2020-11-02 ENCOUNTER — Other Ambulatory Visit (HOSPITAL_COMMUNITY): Payer: Self-pay | Admitting: Physician Assistant

## 2020-11-02 ENCOUNTER — Other Ambulatory Visit: Payer: Self-pay

## 2020-11-02 ENCOUNTER — Telehealth (HOSPITAL_COMMUNITY): Payer: Self-pay

## 2020-11-02 DIAGNOSIS — F410 Panic disorder [episodic paroxysmal anxiety] without agoraphobia: Secondary | ICD-10-CM

## 2020-11-02 DIAGNOSIS — F99 Mental disorder, not otherwise specified: Secondary | ICD-10-CM

## 2020-11-02 DIAGNOSIS — F5105 Insomnia due to other mental disorder: Secondary | ICD-10-CM

## 2020-11-02 DIAGNOSIS — F4310 Post-traumatic stress disorder, unspecified: Secondary | ICD-10-CM

## 2020-11-02 DIAGNOSIS — F250 Schizoaffective disorder, bipolar type: Secondary | ICD-10-CM

## 2020-11-02 MED ORDER — TRAZODONE HCL 50 MG PO TABS
50.0000 mg | ORAL_TABLET | Freq: Every day | ORAL | 1 refills | Status: DC
  Filled 2020-11-02: qty 30, 30d supply, fill #0
  Filled 2020-11-30: qty 30, 30d supply, fill #1

## 2020-11-02 MED ORDER — BENZTROPINE MESYLATE 0.5 MG PO TABS
ORAL_TABLET | ORAL | 1 refills | Status: DC
  Filled 2020-11-02: qty 60, 30d supply, fill #0
  Filled 2020-11-30: qty 60, 30d supply, fill #1

## 2020-11-02 MED ORDER — ESCITALOPRAM OXALATE 10 MG PO TABS
ORAL_TABLET | ORAL | 1 refills | Status: DC
Start: 2020-11-02 — End: 2020-12-08
  Filled 2020-11-02: qty 90, 30d supply, fill #0
  Filled 2020-11-30: qty 90, 30d supply, fill #1

## 2020-11-02 NOTE — Telephone Encounter (Signed)
Provider contacted by Alinda Money, CMA regarding medication refills. Provider to refill patient's medications and e-prescribed to preferred pharmacy.

## 2020-11-02 NOTE — Progress Notes (Signed)
Provider contacted by Alinda Money, CMA regarding medication refills. Patient's escitalopram 10 mg and Benztropine 0.5 mg to be e-prescribed to pharmacy of choice. Patient's trazodone 50 mg has been refilled.

## 2020-11-02 NOTE — Telephone Encounter (Signed)
Patient called requesting a refill on her Escitalopram 10mg  & Benztropine 0.5mg . I also need clarification on her Trazodone. She's been taking 150mg  and also 50mg  to equal 200mg . In the notes from 2/23 visit, it states a total of 200mg  qhs; however, if you want pt to stay on 200mg  total, the pharmacy needs a new script sent in for the 50mg . I only see the 150mg  under the Medications tab. Please review and advise. Thank you

## 2020-11-03 ENCOUNTER — Other Ambulatory Visit: Payer: Self-pay

## 2020-11-04 ENCOUNTER — Telehealth (HOSPITAL_COMMUNITY): Payer: No Payment, Other | Admitting: Physician Assistant

## 2020-11-04 NOTE — Telephone Encounter (Signed)
I called patient to followup with her to make sure she got her medications provider sent in. While we were talking, she also requested a refill on her Risperidone 1mg  & her Risperidone 2mg . The others were sent to [facility]. Thank you

## 2020-11-05 ENCOUNTER — Other Ambulatory Visit (HOSPITAL_COMMUNITY): Payer: Self-pay | Admitting: Physician Assistant

## 2020-11-05 DIAGNOSIS — F250 Schizoaffective disorder, bipolar type: Secondary | ICD-10-CM

## 2020-11-05 MED ORDER — RISPERIDONE 2 MG PO TABS
ORAL_TABLET | ORAL | 1 refills | Status: DC
  Filled 2020-11-05 – 2020-11-30 (×2): qty 30, 30d supply, fill #0
  Filled 2020-12-29: qty 30, 30d supply, fill #1

## 2020-11-05 MED ORDER — RISPERIDONE 1 MG PO TABS
ORAL_TABLET | ORAL | 1 refills | Status: DC
  Filled 2020-11-05 – 2020-11-30 (×2): qty 30, 30d supply, fill #0
  Filled 2020-12-29: qty 30, 30d supply, fill #1

## 2020-11-05 NOTE — Telephone Encounter (Signed)
Provider was contacted by Pamela E Noveck, CMA regarding patient's medications. Patient's medication to be e-prescribed to pharmacy of choice. 

## 2020-11-05 NOTE — Progress Notes (Signed)
Provider was contacted by Alinda Money, CMA regarding patient's medications. Patient's medication to be e-prescribed to pharmacy of choice.

## 2020-11-07 ENCOUNTER — Other Ambulatory Visit: Payer: Self-pay

## 2020-11-07 NOTE — Telephone Encounter (Signed)
Notified patient.

## 2020-11-14 ENCOUNTER — Other Ambulatory Visit: Payer: Self-pay

## 2020-11-28 ENCOUNTER — Other Ambulatory Visit (HOSPITAL_COMMUNITY): Payer: Self-pay | Admitting: Psychiatry

## 2020-11-28 ENCOUNTER — Telehealth (HOSPITAL_COMMUNITY): Payer: Self-pay | Admitting: *Deleted

## 2020-11-28 ENCOUNTER — Other Ambulatory Visit: Payer: Self-pay

## 2020-11-28 DIAGNOSIS — F410 Panic disorder [episodic paroxysmal anxiety] without agoraphobia: Secondary | ICD-10-CM

## 2020-11-28 NOTE — Telephone Encounter (Signed)
Provider called the pharmacy and they noted that the patient has not picked up Klonopin since 11/11/2019.  Provider was unable to E prescribe the medication so [facility] [address].  Per pharmacist it will take an hour to process this request.  No other concerns noted at this time.

## 2020-11-28 NOTE — Telephone Encounter (Signed)
Record indicates a rx for Klonopin called in per Otila Back PA with date of 5/4. She called back stating there was no such Rx. Writer called to verify and there is no such rx on file and no refills. Will reach out to Dr Doyne Keel who is covering today for Eddie for a rx to be called [facility] [address]

## 2020-11-30 ENCOUNTER — Other Ambulatory Visit: Payer: Self-pay

## 2020-11-30 MED FILL — Trazodone HCl Tab 150 MG: ORAL | 30 days supply | Qty: 30 | Fill #0 | Status: AC

## 2020-12-08 ENCOUNTER — Other Ambulatory Visit: Payer: Self-pay

## 2020-12-08 ENCOUNTER — Encounter (HOSPITAL_COMMUNITY): Payer: Self-pay | Admitting: Physician Assistant

## 2020-12-08 ENCOUNTER — Telehealth (INDEPENDENT_AMBULATORY_CARE_PROVIDER_SITE_OTHER): Payer: No Payment, Other | Admitting: Physician Assistant

## 2020-12-08 DIAGNOSIS — F250 Schizoaffective disorder, bipolar type: Secondary | ICD-10-CM

## 2020-12-08 DIAGNOSIS — F5105 Insomnia due to other mental disorder: Secondary | ICD-10-CM | POA: Diagnosis not present

## 2020-12-08 DIAGNOSIS — F4310 Post-traumatic stress disorder, unspecified: Secondary | ICD-10-CM

## 2020-12-08 DIAGNOSIS — F410 Panic disorder [episodic paroxysmal anxiety] without agoraphobia: Secondary | ICD-10-CM | POA: Diagnosis not present

## 2020-12-08 DIAGNOSIS — F99 Mental disorder, not otherwise specified: Secondary | ICD-10-CM

## 2020-12-08 DIAGNOSIS — F909 Attention-deficit hyperactivity disorder, unspecified type: Secondary | ICD-10-CM

## 2020-12-08 MED ORDER — ESCITALOPRAM OXALATE 10 MG PO TABS
ORAL_TABLET | ORAL | 1 refills | Status: DC
  Filled 2020-12-08: qty 90, fill #0
  Filled 2021-01-10 – 2021-01-20 (×2): qty 90, 30d supply, fill #0
  Filled 2021-02-13: qty 90, 30d supply, fill #1

## 2020-12-08 MED ORDER — ATOMOXETINE HCL 40 MG PO CAPS
40.0000 mg | ORAL_CAPSULE | Freq: Every day | ORAL | 1 refills | Status: DC
  Filled 2020-12-08 – 2020-12-29 (×3): qty 30, 30d supply, fill #0
  Filled 2021-02-13: qty 30, 30d supply, fill #1

## 2020-12-08 NOTE — Progress Notes (Signed)
BH MD/PA/NP OP Progress Note  Virtual Visit via Telephone Note  I connected with [name] on 12/08/20 at  3:30 PM EDT by telephone and verified that I am speaking with the correct person using two identifiers.  Location: Patient: Secure area Provider: Clinic   I discussed the limitations, risks, security and privacy concerns of performing an evaluation and management service by telephone and the availability of in person appointments. I also discussed with the patient that there may be a patient responsible charge related to this service. The patient expressed understanding and agreed to proceed.  Follow Up Instructions:  I discussed the assessment and treatment plan with the patient. The patient was provided an opportunity to ask questions and all were answered. The patient agreed with the plan and demonstrated an understanding of the instructions.   The patient was advised to call back or seek an in-person evaluation if the symptoms worsen or if the condition fails to improve as anticipated.  I provided 23 minutes of non-face-to-face time during this encounter.  Meta Hatchet, PA   12/08/2020 5:53 PM Kimberl Vig  MRN:  [id number]  Chief Complaint: Follow-up and medication management  HPI:   [name] is a 50 year old female with a past psychiatric history significant for schizoaffective disorder (bipolar type), insomnia, generalized anxiety disorder, PTSD, and panic disorder who presents to [facility] for follow-up and medication management.  Patient is currently being managed on the following medications:  Escitalopram 30 mg daily Trazodone 200 mg at bedtime Clonazepam 1 mg 2 times daily as needed Risperidone 1 mg in the morning/2 mg at bedtime Benztropine 0.5 mg 2 times daily  Patient reports no issues or concerns regarding her current regimen of medications.  Patient denies the need for dosage adjustments at this time and is  requesting refills on her medications.  Patient does report that she has been having issues with her memory and focus.  Patient reports having a past diagnosis of ADHD.  Patient endorses the following symptoms related to ADHD: task avoidance, frequent procrastination, and issues with time management.  Patient reports that she has been on the following ADHD medications in the past: Vyvanse and Adderall.  Patient reports that she last took her ADHD medications back when she was being seen at [facility] in 2016.  A GAD-7 screen was performed with the patient scoring a 13.  Patient is pleasant, calm, cooperative, and fully engaged in conversation during the encounter.  Patient endorses happy mood.  Patient denies suicidal or homicidal ideations.  She further denies auditory or visual hallucinations and does not appear to be responding to internal/external stimuli.  Patient endorses fair sleep and receives on average 5 to 6 hours of intermittent sleep.  Patient endorses fluctuating appetite and states that she is rarely hungry in the morning.  Patient expresses that she will often wake up in the middle of the night and start eating.  Patient denies alcohol consumption and illicit drug use.  Patient endorses tobacco use and smokes on average a pack a day.  Visit Diagnosis:    ICD-10-CM   1. PTSD (post-traumatic stress disorder)  F43.10 escitalopram (LEXAPRO) 10 MG tablet  2. Schizoaffective disorder, bipolar type (HCC)  F25.0 escitalopram (LEXAPRO) 10 MG tablet  3. Panic attacks  F41.0 escitalopram (LEXAPRO) 10 MG tablet  4. Insomnia due to other mental disorder  F51.05 escitalopram (LEXAPRO) 10 MG tablet   F99   5. Attention deficit  hyperactivity disorder (ADHD), unspecified ADHD type  F90.9 atomoxetine (STRATTERA) 40 MG capsule    Past Psychiatric History:  Schizoaffective,bipolar type Insomnia Anxiety PTSD Panic attacks  Past Medical History:  Past Medical  History:  Diagnosis Date  . Alcohol abuse   . Anxiety   . Arthritis   . Bipolar disorder (HCC)   . Depression   . GERD (gastroesophageal reflux disease)   . H/O hiatal hernia   . Hemorrhoids   . Small bowel obstruction (HCC) 2012  . Wolf-Parkinson-White syndrome   . Wolf-Parkinson-White syndrome    per patient    Past Surgical History:  Procedure Laterality Date  . ESOPHAGOGASTRODUODENOSCOPY  06/07/2011   Procedure: ESOPHAGOGASTRODUODENOSCOPY (EGD);  Surgeon: Rob Bunting, MD;  Location: [facility];  Service: Endoscopy;  Laterality: N/A;  . TUBAL LIGATION      Family Psychiatric History:  Grandfather's sister has schizophrenia Grandmother's brother has a mental illness of some sort  Family History:  Family History  Problem Relation Age of Onset  . Breast cancer Maternal Aunt   . Colon polyps Maternal Grandmother   . Diabetes Maternal Grandmother   . Heart attack Maternal Grandmother   . Kidney cancer Maternal Grandmother   . Breast cancer Maternal Grandmother   . Colon cancer Neg Hx   . Anesthesia problems Neg Hx   . Hypotension Neg Hx   . Malignant hyperthermia Neg Hx   . Pseudochol deficiency Neg Hx   . Heart disease Father   . Heart disease Paternal Grandfather   . Heart disease Paternal Grandmother     Social History:  Social History   Socioeconomic History  . Marital status: Single    Spouse name: Not on file  . Number of children: 4  . Years of education: Not on file  . Highest education level: Not on file  Occupational History  . Occupation: house wife  Tobacco Use  . Smoking status: Current Every Day Smoker    Packs/day: 1.00    Types: Cigarettes  . Smokeless tobacco: Never Used  Substance and Sexual Activity  . Alcohol use: No    Comment: previous alcohol abuse but for last 2 weeks 2 drinks. not currently drinking alcohol  . Drug use: No  . Sexual activity: Not Currently    Birth control/protection: None  Other Topics Concern  . Not on  file  Social History Narrative  . Not on file   Social Determinants of Health   Financial Resource Strain: Medium Risk  . Difficulty of Paying Living Expenses: Somewhat hard  Food Insecurity: No Food Insecurity  . Worried About Programme researcher, broadcasting/film/video in the Last Year: Never true  . Ran Out of Food in the Last Year: Never true  Transportation Needs: No Transportation Needs  . Lack of Transportation (Medical): No  . Lack of Transportation (Non-Medical): No  Physical Activity: Inactive  . Days of Exercise per Week: 0 days  . Minutes of Exercise per Session: 0 min  Stress: Stress Concern Present  . Feeling of Stress : Very much  Social Connections: Moderately Isolated  . Frequency of Communication with Friends and Family: More than three times a week  . Frequency of Social Gatherings with Friends and Family: Once a week  . Attends Religious Services: Never  . Active Member of Clubs or Organizations: No  . Attends Banker Meetings: Never  . Marital Status: Married    Allergies:  Allergies  Allergen Reactions  . Codeine Nausea Only  Metabolic Disorder Labs: No results found for: HGBA1C, MPG No results found for: PROLACTIN Lab Results  Component Value Date   CHOL  01/03/2009    147        ATP III CLASSIFICATION:  <200     mg/dL   Desirable  [id number]  mg/dL   Borderline High  >=914>=240    mg/dL   High          TRIG [id number] 01/03/2009   HDL 19 (L) 01/03/2009   CHOLHDL 7.7 01/03/2009   VLDL 28 01/03/2009   LDLCALC (H) 01/03/2009    100        Total Cholesterol/HDL:CHD Risk Coronary Heart Disease Risk Table                     Men   Women  1/2 Average Risk   3.4   3.3  Average Risk       5.0   4.4  2 X Average Risk   9.6   7.1  3 X Average Risk  23.4   11.0        Use the calculated Patient Ratio above and the CHD Risk Table to determine the patient's CHD Risk.        ATP III CLASSIFICATION (LDL):  <100     mg/dL   Optimal  [id number]  mg/dL   Near or Above                     Optimal  130-159  mg/dL   Borderline  [id number]  mg/dL   High  >469>190     mg/dL   Very High   Lab Results  Component Value Date   TSH 3.259 Test methodology is 3rd generation TSH 01/03/2009    Therapeutic Level Labs: No results found for: LITHIUM No results found for: VALPROATE No components found for:  CBMZ  Current Medications: Current Outpatient Medications  Medication Sig Dispense Refill  . atomoxetine (STRATTERA) 40 MG capsule Take 1 capsule (40 mg total) by mouth daily. 30 capsule 1  . albuterol (PROVENTIL HFA;VENTOLIN HFA) 108 (90 BASE) MCG/ACT inhaler Inhale 2 puffs into the lungs every 6 (six) hours as needed for shortness of breath (panic attacks). (Patient not taking: Reported on 05/27/2020)    . amphetamine-dextroamphetamine (ADDERALL XR) 30 MG 24 hr capsule Take 30 mg by mouth 2 (two) times daily. (Patient not taking: Reported on 05/27/2020)    . amphetamine-dextroamphetamine (ADDERALL) 10 MG tablet Take 10 mg by mouth daily with breakfast. (Patient not taking: Reported on 05/27/2020)    . benztropine (COGENTIN) 0.5 MG tablet TAKE 1 TABLET (0.5 MG TOTAL) BY MOUTH 2 (TWO) TIMES DAILY. 60 tablet 1  . clonazePAM (KLONOPIN) 1 MG tablet Take 1 tablet (1 mg total) by mouth 2 (two) times daily as needed for anxiety. 60 tablet 0  . cyclobenzaprine (FLEXERIL) 10 MG tablet Take 1 tablet (10 mg total) by mouth 2 (two) times daily as needed for muscle spasms. (Patient not taking: Reported on 05/27/2020) 20 tablet 0  . diclofenac (VOLTAREN) 75 MG EC tablet Take 75 mg by mouth 2 (two) times daily. (Patient not taking: Reported on 05/27/2020)    . escitalopram (LEXAPRO) 10 MG tablet TAKE 3 TABLETS (30 MG TOTAL) BY MOUTH DAILY. 90 tablet 1  . esomeprazole (NEXIUM) 20 MG capsule Take 20 mg by mouth daily before breakfast. (Patient not taking: Reported on 05/27/2020)    . HYDROcodone-acetaminophen (NORCO/VICODIN) 5-325 MG per tablet  Take 1 tablet by mouth every 4 (four) hours as  needed for moderate pain or severe pain. (Patient not taking: Reported on 05/27/2020) 4 tablet 0  . ibuprofen (ADVIL,MOTRIN) 800 MG tablet Take 1 tablet (800 mg total) by mouth 3 (three) times daily. (Patient not taking: Reported on 05/27/2020) 21 tablet 0  . methocarbamol (ROBAXIN) 500 MG tablet Take 1 tablet (500 mg total) by mouth 2 (two) times daily. (Patient not taking: Reported on 05/27/2020) 20 tablet 0  . methocarbamol (ROBAXIN) 500 MG tablet Take 1 tablet (500 mg total) by mouth 2 (two) times daily. (Patient not taking: Reported on 05/27/2020) 20 tablet 0  . naproxen (NAPROSYN) 500 MG tablet Take 1 tablet (500 mg total) by mouth 2 (two) times daily. (Patient not taking: Reported on 05/27/2020) 30 tablet 0  . oxycodone (OXY-IR) 5 MG capsule Take 1 capsule (5 mg total) by mouth every 4 (four) hours as needed. (Patient not taking: Reported on 05/27/2020) 20 capsule 0  . oxyCODONE-acetaminophen (PERCOCET/ROXICET) 5-325 MG per tablet Take 1-2 tablets by mouth every 6 (six) hours as needed for severe pain. (Patient not taking: Reported on 05/27/2020) 20 tablet 0  . risperiDONE (RISPERDAL) 1 MG tablet TAKE 1 TABLET (1 MG TOTAL) BY MOUTH IN THE MORNING. 30 tablet 1  . risperiDONE (RISPERDAL) 2 MG tablet TAKE 1 TABLET (2 MG TOTAL) BY MOUTH AT BEDTIME. 30 tablet 1  . traZODone (DESYREL) 150 MG tablet TAKE 1 TABLET (150 MG TOTAL) BY MOUTH AT BEDTIME AS NEEDED FOR SLEEP. 30 tablet 1  . traZODone (DESYREL) 50 MG tablet Take 1 tablet (50 mg total) by mouth at bedtime. 30 tablet 1  . triamcinolone cream (KENALOG) 0.1 % Apply 1 application topically 2 (two) times daily. (Patient not taking: Reported on 05/27/2020)     No current facility-administered medications for this visit.     Musculoskeletal: Strength & Muscle Tone: Unable to assess due to telemedicine visit Gait & Station: Unable to assess due to telemedicine visit Patient leans:  Unable to assess due to telemedicine visit  Psychiatric  Specialty Exam: Review of Systems  Psychiatric/Behavioral: Positive for decreased concentration and sleep disturbance. Negative for agitation, dysphoric mood, hallucinations, self-injury and suicidal ideas. The patient is nervous/anxious. The patient is not hyperactive.     There were no vitals taken for this visit.There is no height or weight on file to calculate BMI.  General Appearance:  Unable to assess due to telemedicine visit  Eye Contact:  Unable to assess due to telemedicine visit  Speech:  Clear and Coherent and Normal Rate  Volume:  Normal  Mood:  Anxious and Euthymic  Affect:  Appropriate and Congruent  Thought Process:  Coherent, Goal Directed and Descriptions of Associations: Intact  Orientation:  Full (Time, Place, and Person)  Thought Content: WDL   Suicidal Thoughts:  No  Homicidal Thoughts:  No  Memory:  Immediate;   Good Recent;   Fair Remote;   Fair  Judgement:  Fair  Insight:  Fair  Psychomotor Activity:  Normal  Concentration:  Concentration: Good and Attention Span: Good  Recall:  Good  Fund of Knowledge: Good  Language: Good  Akathisia:  NA  Handed:  Right  AIMS (if indicated): not done  Assets:  Communication Skills Desire for Improvement Housing  ADL's:  Intact  Cognition: WNL  Sleep:  Poor   Screenings: GAD-7   Flowsheet Row Video Visit from 12/08/2020 in [facility] Video Visit from 09/21/2020 in [facility]  Total GAD-7 Score 13 12    PHQ2-9   Flowsheet Row Video Visit from 12/08/2020 in [facility] Video Visit from 09/21/2020 in [facility] Counselor from 05/27/2020 in [facility]  PHQ-2 Total Score PHQ-9 Total Score -- -- 20    Flowsheet Row Video Visit from 12/08/2020 in [facility] Video Visit from 09/21/2020 in [facility]  C-SSRS RISK  CATEGORY No Risk No Risk       Assessment and Plan:   [name] is a 50 year old female with a past psychiatric history significant for schizoaffective disorder (bipolar type), insomnia, generalized anxiety disorder, PTSD, and panic disorder who presents to [facility] for follow-up and medication management.  Patient reports that she has been experiencing issues with her memory and focus.  Patient has a past diagnosis of ADHD and states that she was taking medications back in 2016.  Patient endorses the following ADHD related symptoms: task avoidance, frequent procrastination, and issues with time management.  Patient was recommended Strattera 40 mg daily for the management of her lack of focus and impaired memory.  Patient's medications to be e-prescribed to pharmacy of choice.  1. PTSD (post-traumatic stress disorder)  - escitalopram (LEXAPRO) 10 MG tablet; TAKE 3 TABLETS (30 MG TOTAL) BY MOUTH DAILY.  Dispense: 90 tablet; Refill: 1  2. Schizoaffective disorder, bipolar type (HCC)  - escitalopram (LEXAPRO) 10 MG tablet; TAKE 3 TABLETS (30 MG TOTAL) BY MOUTH DAILY.  Dispense: 90 tablet; Refill: 1  3. Panic attacks  - escitalopram (LEXAPRO) 10 MG tablet; TAKE 3 TABLETS (30 MG TOTAL) BY MOUTH DAILY.  Dispense: 90 tablet; Refill: 1  4. Insomnia due to other mental disorder  - escitalopram (LEXAPRO) 10 MG tablet; TAKE 3 TABLETS (30 MG TOTAL) BY MOUTH DAILY.  Dispense: 90 tablet; Refill: 1  5. Attention deficit hyperactivity disorder (ADHD), unspecified ADHD type  - atomoxetine (STRATTERA) 40 MG capsule; Take 1 capsule (40 mg total) by mouth daily.  Dispense: 30 capsule; Refill: 1  Patient to follow-up in 2 months  Meta Hatchet, PA 12/08/2020, 5:53 PM

## 2020-12-09 ENCOUNTER — Other Ambulatory Visit: Payer: Self-pay

## 2020-12-12 ENCOUNTER — Other Ambulatory Visit: Payer: Self-pay

## 2020-12-16 ENCOUNTER — Other Ambulatory Visit: Payer: Self-pay

## 2020-12-28 ENCOUNTER — Other Ambulatory Visit (HOSPITAL_COMMUNITY): Payer: Self-pay | Admitting: Psychiatry

## 2020-12-28 DIAGNOSIS — F410 Panic disorder [episodic paroxysmal anxiety] without agoraphobia: Secondary | ICD-10-CM

## 2020-12-29 ENCOUNTER — Other Ambulatory Visit: Payer: Self-pay

## 2020-12-29 ENCOUNTER — Telehealth (HOSPITAL_COMMUNITY): Payer: Self-pay | Admitting: *Deleted

## 2020-12-29 ENCOUNTER — Telehealth (HOSPITAL_COMMUNITY): Payer: Self-pay

## 2020-12-29 ENCOUNTER — Other Ambulatory Visit (HOSPITAL_COMMUNITY): Payer: Self-pay | Admitting: Physician Assistant

## 2020-12-29 DIAGNOSIS — F410 Panic disorder [episodic paroxysmal anxiety] without agoraphobia: Secondary | ICD-10-CM

## 2020-12-29 DIAGNOSIS — F5105 Insomnia due to other mental disorder: Secondary | ICD-10-CM

## 2020-12-29 DIAGNOSIS — F250 Schizoaffective disorder, bipolar type: Secondary | ICD-10-CM

## 2020-12-29 MED ORDER — CLONAZEPAM 1 MG PO TABS: 1.0000 mg | ORAL_TABLET | Freq: Two times a day (BID) | ORAL | 1 refills | Status: DC | PRN

## 2020-12-29 MED FILL — Trazodone HCl Tab 150 MG: ORAL | 30 days supply | Qty: 30 | Fill #1 | Status: AC

## 2020-12-29 NOTE — Telephone Encounter (Signed)
Patient & Rx Refill Request: clonazePAM (KLONOPIN) 1 MG tablet

## 2020-12-29 NOTE — Progress Notes (Signed)
Provider was contacted by [name], RMA regarding patient's medication refill. Patient's medication to be e-prescribed to pharmacy of choice.

## 2020-12-29 NOTE — Telephone Encounter (Signed)
Provider was contacted by Direce E McIntyre, RMA regarding patient's medication refill. Patient's medication to be e-prescribed to pharmacy of choice.

## 2020-12-29 NOTE — Telephone Encounter (Signed)
Patient called requesting a refill on her Clonazepam 1mg  to be sent to [facility] on [address]/[place]

## 2020-12-30 ENCOUNTER — Other Ambulatory Visit: Payer: Self-pay

## 2020-12-31 MED ORDER — BENZTROPINE MESYLATE 0.5 MG PO TABS
ORAL_TABLET | ORAL | 1 refills | Status: DC
  Filled 2020-12-31: qty 60, 30d supply, fill #0

## 2020-12-31 MED ORDER — TRAZODONE HCL 50 MG PO TABS
50.0000 mg | ORAL_TABLET | Freq: Every day | ORAL | 1 refills | Status: DC
  Filled 2020-12-31: qty 30, 30d supply, fill #0
  Filled 2021-02-13: qty 30, 30d supply, fill #1

## 2021-01-02 ENCOUNTER — Other Ambulatory Visit: Payer: Self-pay

## 2021-01-03 ENCOUNTER — Other Ambulatory Visit: Payer: Self-pay

## 2021-01-04 NOTE — Telephone Encounter (Signed)
Notified patient.

## 2021-01-10 ENCOUNTER — Other Ambulatory Visit: Payer: Self-pay

## 2021-01-17 ENCOUNTER — Other Ambulatory Visit: Payer: Self-pay

## 2021-01-20 ENCOUNTER — Other Ambulatory Visit: Payer: Self-pay

## 2021-02-03 ENCOUNTER — Other Ambulatory Visit (HOSPITAL_COMMUNITY): Payer: Self-pay

## 2021-02-03 ENCOUNTER — Other Ambulatory Visit: Payer: Self-pay

## 2021-02-06 ENCOUNTER — Other Ambulatory Visit: Payer: Self-pay

## 2021-02-06 ENCOUNTER — Telehealth (HOSPITAL_COMMUNITY): Payer: Self-pay

## 2021-02-06 NOTE — Telephone Encounter (Signed)
Patient called requesting a refill on her Risperidone. I also spoke with Tresa Endo at [facility] and she confirmed that they don't have a refill on the Risperidone 1mg  or the Risperidone 2mg  so they need those to be sent in. Thank you

## 2021-02-07 ENCOUNTER — Other Ambulatory Visit (HOSPITAL_COMMUNITY): Payer: Self-pay | Admitting: Physician Assistant

## 2021-02-07 ENCOUNTER — Other Ambulatory Visit: Payer: Self-pay

## 2021-02-07 DIAGNOSIS — F250 Schizoaffective disorder, bipolar type: Secondary | ICD-10-CM

## 2021-02-07 MED ORDER — RISPERIDONE 2 MG PO TABS
ORAL_TABLET | ORAL | 2 refills | Status: DC
  Filled 2021-02-07: qty 30, 30d supply, fill #0
  Filled 2021-03-24: qty 30, 30d supply, fill #1

## 2021-02-07 MED ORDER — RISPERIDONE 1 MG PO TABS
ORAL_TABLET | ORAL | 2 refills | Status: DC
  Filled 2021-02-07: qty 30, 30d supply, fill #0
  Filled 2021-03-24: qty 30, 30d supply, fill #1

## 2021-02-07 NOTE — Telephone Encounter (Signed)
Provider was contacted by Pamela E Noveck, CMA regarding medication refill. Patient's medication to be e-prescribed to pharmacy of choice.

## 2021-02-07 NOTE — Progress Notes (Signed)
Provider was contacted by Pamela E Noveck, CMA regarding medication refill. Patient's medication to be e-prescribed to pharmacy of choice.

## 2021-02-08 ENCOUNTER — Other Ambulatory Visit: Payer: Self-pay

## 2021-02-09 ENCOUNTER — Telehealth (HOSPITAL_COMMUNITY): Payer: No Payment, Other | Admitting: Physician Assistant

## 2021-02-09 ENCOUNTER — Other Ambulatory Visit: Payer: Self-pay

## 2021-02-13 ENCOUNTER — Telehealth (HOSPITAL_COMMUNITY): Payer: Self-pay | Admitting: Physician Assistant

## 2021-02-13 ENCOUNTER — Other Ambulatory Visit (HOSPITAL_COMMUNITY): Payer: Self-pay

## 2021-02-13 ENCOUNTER — Other Ambulatory Visit (HOSPITAL_COMMUNITY): Payer: Self-pay | Admitting: Psychiatry

## 2021-02-13 ENCOUNTER — Other Ambulatory Visit: Payer: Self-pay

## 2021-02-13 DIAGNOSIS — F99 Mental disorder, not otherwise specified: Secondary | ICD-10-CM

## 2021-02-13 DIAGNOSIS — F5105 Insomnia due to other mental disorder: Secondary | ICD-10-CM

## 2021-02-13 MED ORDER — TRAZODONE HCL 150 MG PO TABS
ORAL_TABLET | ORAL | 1 refills | Status: DC
  Filled 2021-02-13: qty 30, 30d supply, fill #0
  Filled 2021-03-24: qty 30, 30d supply, fill #1

## 2021-02-13 NOTE — Telephone Encounter (Signed)
Patient called for REFILL:TRAZADONE 150MG  Pharmacy :  [facility]  Patient phone [phone] Last seen:  12/08/20 COMMENTS:   No-Show 02/09/21

## 2021-02-13 NOTE — Telephone Encounter (Signed)
This is a patient of [name], [name], PA (Eddie). I have filled the order since he is out of the office and sent to preferred pharmacy.

## 2021-02-15 ENCOUNTER — Other Ambulatory Visit: Payer: Self-pay

## 2021-02-16 ENCOUNTER — Other Ambulatory Visit: Payer: Self-pay

## 2021-02-20 ENCOUNTER — Other Ambulatory Visit: Payer: Self-pay

## 2021-02-27 ENCOUNTER — Telehealth (HOSPITAL_COMMUNITY): Payer: Self-pay | Admitting: *Deleted

## 2021-02-27 NOTE — Telephone Encounter (Signed)
Pt called requesting Rx clonazePAM (KLONOPIN) 1 MG tablet  [facility] [place].  Last virtual visit-12/08/20 Last refill--12/29/20

## 2021-02-28 ENCOUNTER — Telehealth (HOSPITAL_COMMUNITY): Payer: Self-pay | Admitting: *Deleted

## 2021-02-28 ENCOUNTER — Other Ambulatory Visit (HOSPITAL_COMMUNITY): Payer: Self-pay | Admitting: Psychiatry

## 2021-02-28 DIAGNOSIS — F410 Panic disorder [episodic paroxysmal anxiety] without agoraphobia: Secondary | ICD-10-CM

## 2021-02-28 MED ORDER — CLONAZEPAM 1 MG PO TABS: 1.0000 mg | ORAL_TABLET | Freq: Two times a day (BID) | ORAL | 1 refills | Status: DC | PRN

## 2021-02-28 NOTE — Telephone Encounter (Signed)
Patient's medication e-prescribed to pharmacy of choice. 

## 2021-02-28 NOTE — Telephone Encounter (Signed)
Call this am needing a new rx for her Klonopin. Put a note in for the provider this am and no response at noon when patient called again to see if we had called it in. THis is her usual behavior re her meds. I contacted the provider that is in the office today, as Eddie PA patients provider is working at the [facility] this week and this Clinical research associate is uncertain if he is picking up his messages. Will forward concern to [facility] NP the provider that is here today to see if she can call in patients klonopin.

## 2021-02-28 NOTE — Telephone Encounter (Signed)
Call from patient seeking new rx for her Klonpin. She should be out of it as of today. She no showed for her last appt on 7/14 and has a future appt on 04/11/21. Will bring her concern to Eddies attention to see if he is willing to right a RX till her next appt in a little over one month.

## 2021-03-02 NOTE — Telephone Encounter (Signed)
Message acknowledged and reviewed. Patient's medication were sent out to preferred pharmacy.

## 2021-03-24 ENCOUNTER — Other Ambulatory Visit: Payer: Self-pay

## 2021-03-24 ENCOUNTER — Other Ambulatory Visit (HOSPITAL_COMMUNITY): Payer: Self-pay | Admitting: Psychiatry

## 2021-03-24 ENCOUNTER — Other Ambulatory Visit (HOSPITAL_COMMUNITY): Payer: Self-pay | Admitting: Physician Assistant

## 2021-03-24 ENCOUNTER — Telehealth (HOSPITAL_COMMUNITY): Payer: Self-pay | Admitting: *Deleted

## 2021-03-24 DIAGNOSIS — F909 Attention-deficit hyperactivity disorder, unspecified type: Secondary | ICD-10-CM

## 2021-03-24 DIAGNOSIS — F250 Schizoaffective disorder, bipolar type: Secondary | ICD-10-CM

## 2021-03-24 DIAGNOSIS — F99 Mental disorder, not otherwise specified: Secondary | ICD-10-CM

## 2021-03-24 DIAGNOSIS — F5105 Insomnia due to other mental disorder: Secondary | ICD-10-CM

## 2021-03-24 DIAGNOSIS — F4310 Post-traumatic stress disorder, unspecified: Secondary | ICD-10-CM

## 2021-03-24 DIAGNOSIS — F410 Panic disorder [episodic paroxysmal anxiety] without agoraphobia: Secondary | ICD-10-CM

## 2021-03-24 MED ORDER — TRAZODONE HCL 150 MG PO TABS: ORAL_TABLET | ORAL | 3 refills | Status: DC

## 2021-03-24 MED ORDER — ESCITALOPRAM OXALATE 10 MG PO TABS
ORAL_TABLET | ORAL | 3 refills | Status: DC
  Filled 2021-03-24: qty 90, 30d supply, fill #0

## 2021-03-24 MED ORDER — TRAZODONE HCL 50 MG PO TABS
50.0000 mg | ORAL_TABLET | Freq: Every day | ORAL | 3 refills | Status: DC
  Filled 2021-03-24: qty 30, 30d supply, fill #0

## 2021-03-24 MED ORDER — ATOMOXETINE HCL 40 MG PO CAPS
40.0000 mg | ORAL_CAPSULE | Freq: Every day | ORAL | 3 refills | Status: DC
Start: 2021-03-24 — End: 2021-05-02
  Filled 2021-03-24: qty 30, 30d supply, fill #0
  Filled 2021-04-21: qty 30, 30d supply, fill #1

## 2021-03-24 NOTE — Telephone Encounter (Signed)
Called to request three medicines she is out of and doesn't have available refills, she has already called her pharmacy to check. Reviewed record and she should be out of Trazodone 50 mg, Strattera, and Lexapro. Her provider is out of the office till next tues, will ask Toy Cookey NP if she will please order these three to patients preferred pharmacy.

## 2021-03-24 NOTE — Telephone Encounter (Signed)
Medication refilled and sent to [facility].

## 2021-04-11 ENCOUNTER — Other Ambulatory Visit: Payer: Self-pay

## 2021-04-11 ENCOUNTER — Telehealth (INDEPENDENT_AMBULATORY_CARE_PROVIDER_SITE_OTHER): Payer: No Payment, Other | Admitting: Physician Assistant

## 2021-04-11 DIAGNOSIS — F250 Schizoaffective disorder, bipolar type: Secondary | ICD-10-CM | POA: Diagnosis not present

## 2021-04-11 DIAGNOSIS — F4310 Post-traumatic stress disorder, unspecified: Secondary | ICD-10-CM

## 2021-04-11 DIAGNOSIS — F5105 Insomnia due to other mental disorder: Secondary | ICD-10-CM | POA: Diagnosis not present

## 2021-04-11 DIAGNOSIS — F410 Panic disorder [episodic paroxysmal anxiety] without agoraphobia: Secondary | ICD-10-CM | POA: Diagnosis not present

## 2021-04-11 DIAGNOSIS — F99 Mental disorder, not otherwise specified: Secondary | ICD-10-CM

## 2021-04-11 MED ORDER — RISPERIDONE 1 MG PO TABS
ORAL_TABLET | ORAL | 2 refills | Status: DC
Start: 2021-04-11 — End: 2021-06-14
  Filled 2021-04-11: qty 30, fill #0
  Filled 2021-04-21: qty 30, 30d supply, fill #0
  Filled 2021-05-17 – 2021-05-25 (×2): qty 30, 30d supply, fill #1

## 2021-04-11 MED ORDER — TRAZODONE HCL 300 MG PO TABS
300.0000 mg | ORAL_TABLET | Freq: Every day | ORAL | 2 refills | Status: DC
Start: 2021-04-11 — End: 2021-05-04
  Filled 2021-04-11: qty 30, 30d supply, fill #0

## 2021-04-11 MED ORDER — RISPERIDONE 2 MG PO TABS
ORAL_TABLET | ORAL | 2 refills | Status: DC
  Filled 2021-04-11: qty 30, fill #0
  Filled 2021-04-21: qty 30, 30d supply, fill #0
  Filled 2021-05-25: qty 30, 30d supply, fill #1

## 2021-04-11 MED ORDER — ESCITALOPRAM OXALATE 20 MG PO TABS
40.0000 mg | ORAL_TABLET | Freq: Every day | ORAL | 2 refills | Status: DC
  Filled 2021-04-11: qty 60, 30d supply, fill #0
  Filled 2021-05-17 – 2021-05-25 (×2): qty 60, 30d supply, fill #1

## 2021-04-11 MED ORDER — CLONAZEPAM 1 MG PO TABS: 1.0000 mg | ORAL_TABLET | Freq: Two times a day (BID) | ORAL | 1 refills | Status: DC | PRN

## 2021-04-11 NOTE — Progress Notes (Signed)
BH MD/PA/NP OP Progress Note  Virtual Visit via Telephone Note  I connected with [name] on 04/14/21 at  1:30 PM EDT by telephone and verified that I am speaking with the correct person using two identifiers.  Location: Patient: Home Provider: Clinic   I discussed the limitations, risks, security and privacy concerns of performing an evaluation and management service by telephone and the availability of in person appointments. I also discussed with the patient that there may be a patient responsible charge related to this service. The patient expressed understanding and agreed to proceed.  Follow Up Instructions:   I discussed the assessment and treatment plan with the patient. The patient was provided an opportunity to ask questions and all were answered. The patient agreed with the plan and demonstrated an understanding of the instructions.   The patient was advised to call back or seek an in-person evaluation if the symptoms worsen or if the condition fails to improve as anticipated.  I provided 25 minutes of non-face-to-face time during this encounter.  Meta Hatchet, PA   04/14/2021 1:58 PM [name]  MRN:  [id number]  Chief Complaint: Follow up and medication management  HPI:   [name] is a 50 year old female with a past psychiatric history significant for schizoaffective disorder (bipolar type), insomnia, PTSD, and panic attacks who presents to [facility] via virtual telephone visit for follow-up and medication management.  Patient is currently being managed on the following medications:  Escitalopram 30 mg daily Trazodone 200 mg at bedtime Clonazepam 1 mg 2 times daily as needed Risperidone 1 mg in the morning/2 mg at bedtime Benztropine 0.5 mg 2 times daily Strattera 40 mg daily  Patient reports that her Strattera has not been helpful in the management of her focus and concentration.  Patient endorses the following  side effects from her Strattera use: drowsiness and lethargy.  Patient states that she also has discontinued taking benztropine due to continuing to experience tremor.  Patient shows interest in taking Austedo for the management of her tremor and restless leg.  Patient endorses anxiety all the time and rates her anxiety an 8 or 9 out of 10.  Patient's stressors include the health of her father and taking care of her father.    Patient endorses the following depressive symptoms: low mood, decreased energy, and frequent crying spells.  She attributes her current depressive episodes to worrying about her father's health.  Patient is interested in increasing the frequency in which she takes her clonazepam for the management of her anxiety.  Provider informed patient that her escitalopram can be adjusted to help manage her anxiety. A PHQ-9 screen was performed with the patient scoring a 15.  A GAD-7 screen was also performed with the patient scoring a 21.  Patient is alert and oriented x4, calm, cooperative, and fully engaged in conversation during the encounter.  Patient reports that she feels sad and worried about her father.  Patient denies suicidal or homicidal ideations.  She further denies auditory or visual hallucinations and does not appear to be responding to internal/external stimuli.  Patient endorses poor sleep stating that she wakes up constantly through the night.  Patient endorses eating mainly at night.  Patient denies alcohol consumption and illicit drug use.  Patient endorses tobacco use and smokes roughly a pack/a pack and a half a day.  Visit Diagnosis:    ICD-10-CM   1. Schizoaffective disorder, bipolar type (HCC)  F25.0 risperiDONE (RISPERDAL) 1  MG tablet    risperiDONE (RISPERDAL) 2 MG tablet    escitalopram (LEXAPRO) 20 MG tablet    2. Insomnia due to other mental disorder  F51.05 trazodone (DESYREL) 300 MG tablet   F99 escitalopram (LEXAPRO) 20 MG tablet    3. PTSD (post-traumatic  stress disorder)  F43.10 escitalopram (LEXAPRO) 20 MG tablet    4. Panic attacks  F41.0 escitalopram (LEXAPRO) 20 MG tablet    clonazePAM (KLONOPIN) 1 MG tablet      Past Psychiatric History:  Schizoaffective, bipolar type Insomnia Anxiety PTSD Panic attacks  Past Medical History:  Past Medical History:  Diagnosis Date   Alcohol abuse    Anxiety    Arthritis    Bipolar disorder (HCC)    Depression    GERD (gastroesophageal reflux disease)    H/O hiatal hernia    Hemorrhoids    Small bowel obstruction (HCC) 2012   Wolf-Parkinson-White syndrome    Wolf-Parkinson-White syndrome    per patient    Past Surgical History:  Procedure Laterality Date   ESOPHAGOGASTRODUODENOSCOPY  06/07/2011   Procedure: ESOPHAGOGASTRODUODENOSCOPY (EGD);  Surgeon: Rob Bunting, MD;  Location: [facility];  Service: Endoscopy;  Laterality: N/A;   TUBAL LIGATION      Family Psychiatric History:  Grandfather's sister has schizophrenia Grandmother's brother has a mental illness of some sort  Family History:  Family History  Problem Relation Age of Onset   Breast cancer Maternal Aunt    Colon polyps Maternal Grandmother    Diabetes Maternal Grandmother    Heart attack Maternal Grandmother    Kidney cancer Maternal Grandmother    Breast cancer Maternal Grandmother    Colon cancer Neg Hx    Anesthesia problems Neg Hx    Hypotension Neg Hx    Malignant hyperthermia Neg Hx    Pseudochol deficiency Neg Hx    Heart disease Father    Heart disease Paternal Grandfather    Heart disease Paternal Grandmother     Social History:  Social History   Socioeconomic History   Marital status: Single    Spouse name: Not on file   Number of children: 4   Years of education: Not on file   Highest education level: Not on file  Occupational History   Occupation: house wife  Tobacco Use   Smoking status: Every Day    Packs/day: 1.00    Types: Cigarettes   Smokeless tobacco: Never  Substance and  Sexual Activity   Alcohol use: No    Comment: previous alcohol abuse but for last 2 weeks 2 drinks. not currently drinking alcohol   Drug use: No   Sexual activity: Not Currently    Birth control/protection: None  Other Topics Concern   Not on file  Social History Narrative   Not on file   Social Determinants of Health   Financial Resource Strain: Medium Risk   Difficulty of Paying Living Expenses: Somewhat hard  Food Insecurity: No Food Insecurity   Worried About Running Out of Food in the Last Year: Never true   Ran Out of Food in the Last Year: Never true  Transportation Needs: No Transportation Needs   Lack of Transportation (Medical): No   Lack of Transportation (Non-Medical): No  Physical Activity: Inactive   Days of Exercise per Week: 0 days   Minutes of Exercise per Session: 0 min  Stress: Stress Concern Present   Feeling of Stress : Very much  Social Connections: Moderately Isolated   Frequency of Communication with  Friends and Family: More than three times a week   Frequency of Social Gatherings with Friends and Family: Once a week   Attends Religious Services: Never   Database administrator or Organizations: No   Attends Banker Meetings: Never   Marital Status: Married    Allergies:  Allergies  Allergen Reactions   Codeine Nausea Only    Metabolic Disorder Labs: No results found for: HGBA1C, MPG No results found for: PROLACTIN Lab Results  Component Value Date   CHOL  01/03/2009    147        ATP III CLASSIFICATION:  <200     mg/dL   Desirable  989-211  mg/dL   Borderline High  >=941    mg/dL   High          TRIG 740 01/03/2009   HDL 19 (L) 01/03/2009   CHOLHDL 7.7 01/03/2009   VLDL 28 01/03/2009   LDLCALC (H) 01/03/2009    100        Total Cholesterol/HDL:CHD Risk Coronary Heart Disease Risk Table                     Men   Women  1/2 Average Risk   3.4   3.3  Average Risk       5.0   4.4  2 X Average Risk   9.6   7.1  3 X  Average Risk  23.4   11.0        Use the calculated Patient Ratio above and the CHD Risk Table to determine the patient's CHD Risk.        ATP III CLASSIFICATION (LDL):  <100     mg/dL   Optimal  814-481  mg/dL   Near or Above                    Optimal  130-159  mg/dL   Borderline  856-314  mg/dL   High  >970     mg/dL   Very High   Lab Results  Component Value Date   TSH 3.259 Test methodology is 3rd generation TSH 01/03/2009    Therapeutic Level Labs: No results found for: LITHIUM No results found for: VALPROATE No components found for:  CBMZ  Current Medications: Current Outpatient Medications  Medication Sig Dispense Refill   albuterol (PROVENTIL HFA;VENTOLIN HFA) 108 (90 BASE) MCG/ACT inhaler Inhale 2 puffs into the lungs every 6 (six) hours as needed for shortness of breath (panic attacks). (Patient not taking: Reported on 05/27/2020)     amphetamine-dextroamphetamine (ADDERALL XR) 30 MG 24 hr capsule Take 30 mg by mouth 2 (two) times daily. (Patient not taking: Reported on 05/27/2020)     amphetamine-dextroamphetamine (ADDERALL) 10 MG tablet Take 10 mg by mouth daily with breakfast. (Patient not taking: Reported on 05/27/2020)     atomoxetine (STRATTERA) 40 MG capsule Take 1 capsule (40 mg total) by mouth daily. 30 capsule 3   benztropine (COGENTIN) 0.5 MG tablet TAKE 1 TABLET (0.5 MG TOTAL) BY MOUTH 2 (TWO) TIMES DAILY. 60 tablet 1   clonazePAM (KLONOPIN) 1 MG tablet Take 1 tablet (1 mg total) by mouth 2 (two) times daily as needed for anxiety. 60 tablet 1   cyclobenzaprine (FLEXERIL) 10 MG tablet Take 1 tablet (10 mg total) by mouth 2 (two) times daily as needed for muscle spasms. (Patient not taking: Reported on 05/27/2020) 20 tablet 0   diclofenac (VOLTAREN) 75 MG EC tablet  Take 75 mg by mouth 2 (two) times daily. (Patient not taking: Reported on 05/27/2020)     escitalopram (LEXAPRO) 20 MG tablet Take 2 tablets (40 mg total) by mouth daily. 60 tablet 2   esomeprazole  (NEXIUM) 20 MG capsule Take 20 mg by mouth daily before breakfast. (Patient not taking: Reported on 05/27/2020)     HYDROcodone-acetaminophen (NORCO/VICODIN) 5-325 MG per tablet Take 1 tablet by mouth every 4 (four) hours as needed for moderate pain or severe pain. (Patient not taking: Reported on 05/27/2020) 4 tablet 0   ibuprofen (ADVIL,MOTRIN) 800 MG tablet Take 1 tablet (800 mg total) by mouth 3 (three) times daily. (Patient not taking: Reported on 05/27/2020) 21 tablet 0   methocarbamol (ROBAXIN) 500 MG tablet Take 1 tablet (500 mg total) by mouth 2 (two) times daily. (Patient not taking: Reported on 05/27/2020) 20 tablet 0   methocarbamol (ROBAXIN) 500 MG tablet Take 1 tablet (500 mg total) by mouth 2 (two) times daily. (Patient not taking: Reported on 05/27/2020) 20 tablet 0   naproxen (NAPROSYN) 500 MG tablet Take 1 tablet (500 mg total) by mouth 2 (two) times daily. (Patient not taking: Reported on 05/27/2020) 30 tablet 0   oxycodone (OXY-IR) 5 MG capsule Take 1 capsule (5 mg total) by mouth every 4 (four) hours as needed. (Patient not taking: Reported on 05/27/2020) 20 capsule 0   oxyCODONE-acetaminophen (PERCOCET/ROXICET) 5-325 MG per tablet Take 1-2 tablets by mouth every 6 (six) hours as needed for severe pain. (Patient not taking: Reported on 05/27/2020) 20 tablet 0   risperiDONE (RISPERDAL) 1 MG tablet TAKE 1 TABLET (1 MG TOTAL) BY MOUTH IN THE MORNING. 30 tablet 2   risperiDONE (RISPERDAL) 2 MG tablet TAKE 1 TABLET (2 MG TOTAL) BY MOUTH AT BEDTIME. 30 tablet 2   trazodone (DESYREL) 300 MG tablet Take 1 tablet (300 mg total) by mouth at bedtime. 30 tablet 2   triamcinolone cream (KENALOG) 0.1 % Apply 1 application topically 2 (two) times daily. (Patient not taking: Reported on 05/27/2020)     No current facility-administered medications for this visit.     Musculoskeletal: Strength & Muscle Tone: Unable to assess due to telemedicine visit Gait & Station: Unable to assess due to  telemedicine visit Patient leans: Unable to assess due to telemedicine visit  Psychiatric Specialty Exam: Review of Systems  Psychiatric/Behavioral:  Positive for decreased concentration and sleep disturbance. Negative for dysphoric mood, hallucinations, self-injury and suicidal ideas. The patient is nervous/anxious. The patient is not hyperactive.    There were no vitals taken for this visit.There is no height or weight on file to calculate BMI.  General Appearance: Unable to assess due to telemedicine visit  Eye Contact:  Unable to assess due to telemedicine visit  Speech:  Clear and Coherent and Normal Rate  Volume:  Normal  Mood:  Anxious and Depressed  Affect:  Congruent and Depressed  Thought Process:  Coherent, Goal Directed, and Descriptions of Associations: Intact  Orientation:  Full (Time, Place, and Person)  Thought Content: WDL   Suicidal Thoughts:  No  Homicidal Thoughts:  No  Memory:  Immediate;   Good Recent;   Fair Remote;   Fair  Judgement:  Fair  Insight:  Fair  Psychomotor Activity:  Normal  Concentration:  Concentration: Good and Attention Span: Good  Recall:  Good  Fund of Knowledge: Good  Language: Good  Akathisia:  NA  Handed:  Right  AIMS (if indicated): not done  Assets:  Communication  Skills Desire for Improvement Housing  ADL's:  Intact  Cognition: WNL  Sleep:  Poor   Screenings: GAD-7    Flowsheet Row Video Visit from 04/11/2021 in [facility] Video Visit from 12/08/2020 in [facility] Video Visit from 09/21/2020 in [facility]  Total GAD-7 Score PHQ2-9    Flowsheet Row Video Visit from 04/11/2021 in [facility] Video Visit from 12/08/2020 in [facility] Video Visit from 09/21/2020 in [facility] Counselor from 05/27/2020 in [facility]  PHQ-2 Total Score PHQ-9 Total Score 15 -- -- 20      Flowsheet Row Video Visit from 04/11/2021 in [facility] Video Visit from 12/08/2020 in [facility] Video Visit from 09/21/2020 in [facility]  C-SSRS RISK CATEGORY No Risk No Risk No Risk        Assessment and Plan:   [name] is a 50 year old female with a past psychiatric history significant for schizoaffective disorder (bipolar type), insomnia, PTSD, and panic attacks who presents to [facility] via virtual telephone visit for follow-up and medication management.  Patient expresses that she is experiencing tremors/restless leg, worsening anxiety, depressive symptoms, and sleep disturbances.  Patient was recommended Austedo for the management of her tremors/restless leg, however, there are no samples available for the patient to try.  Provider will contact patient in the future when samples are available.  Provider recommended adjusting her dosage of escitalopram from 30 mg to 40 mg daily for the management of her depression, anxiety, and panic attacks.  Patient was also recommended increasing her dosage of trazodone from 200 mg to 300 mg at bedtime.  Due to patient currently using a controlled substance (clonazepam), patient will not be placed on a stimulant to help with her ADHD symptoms.  Provider informed patient about other nonstimulant medications for the management of her ADHD.  Patient was agreeable to exploring other options in the future.  Patient's medications to be e-prescribed to pharmacy of choice.  1. Schizoaffective disorder, bipolar type (HCC)  - risperiDONE (RISPERDAL) 1 MG tablet; TAKE 1 TABLET (1 MG TOTAL) BY MOUTH IN THE MORNING.  Dispense: 30 tablet; Refill: 2 - risperiDONE (RISPERDAL) 2 MG tablet; TAKE 1 TABLET (2 MG TOTAL) BY MOUTH AT BEDTIME.  Dispense: 30 tablet; Refill:  2 - escitalopram (LEXAPRO) 20 MG tablet; Take 2 tablets (40 mg total) by mouth daily.  Dispense: 60 tablet; Refill: 2  2. Insomnia due to other mental disorder  - trazodone (DESYREL) 300 MG tablet; Take 1 tablet (300 mg total) by mouth at bedtime.  Dispense: 30 tablet; Refill: 2 - escitalopram (LEXAPRO) 20 MG tablet; Take 2 tablets (40 mg total) by mouth daily.  Dispense: 60 tablet; Refill: 2  3. PTSD (post-traumatic stress disorder)  - escitalopram (LEXAPRO) 20 MG tablet; Take 2 tablets (40 mg total) by mouth daily.  Dispense: 60 tablet; Refill: 2  4. Panic attacks  - escitalopram (LEXAPRO) 20 MG tablet; Take 2 tablets (40 mg total) by mouth daily.  Dispense: 60 tablet; Refill: 2 - clonazePAM (KLONOPIN) 1 MG tablet; Take 1 tablet (1 mg total) by mouth 2 (two) times daily as needed for anxiety.  Dispense: 60 tablet; Refill: 1  Patient to follow up in 2 months Provider spent  22 minutes with the patient/reviewing patient's chart  Meta Hatchet, PA 04/14/2021, 1:58 PM

## 2021-04-12 ENCOUNTER — Other Ambulatory Visit: Payer: Self-pay

## 2021-04-13 ENCOUNTER — Other Ambulatory Visit: Payer: Self-pay

## 2021-04-13 ENCOUNTER — Ambulatory Visit (INDEPENDENT_AMBULATORY_CARE_PROVIDER_SITE_OTHER): Payer: No Payment, Other | Admitting: Licensed Clinical Social Worker

## 2021-04-13 DIAGNOSIS — F250 Schizoaffective disorder, bipolar type: Secondary | ICD-10-CM | POA: Diagnosis not present

## 2021-04-13 NOTE — Progress Notes (Signed)
   THERAPIST PROGRESS NOTE  Session Time: 20  Virtual Visit via Telephone Note  I connected with Franne Forts on 04/13/21 at  1:00 PM EDT by telephone and verified that I am speaking with the correct person using two identifiers.  Location: Patient: [facility]  Provider: Providers Home    I discussed the limitations, risks, security and privacy concerns of performing an evaluation and management service by telephone and the availability of in person appointments. I also discussed with the patient that there may be a patient responsible charge related to this service. The patient expressed understanding and agreed to proceed.     I discussed the assessment and treatment plan with the patient. The patient was provided an opportunity to ask questions and all were answered. The patient agreed with the plan and demonstrated an understanding of the instructions.   The patient was advised to call back or seek an in-person evaluation if the symptoms worsen or if the condition fails to improve as anticipated.  I provided 20 minutes of non-face-to-face time during this encounter.   Weber Cooks, LCSW   Participation Level: Active  Behavioral Response: CasualAlertAnxious and Depressed  Type of Therapy: Individual Therapy  Treatment Goals addressed: Diagnosis: schizoaffective bipolar disorder   Interventions: Supportive  Summary: [name] is a 50 y.o. female who presents with depressed and anxious mood/affect. She was cooperative and maintained good eye contact. Pt was alert and oriented x 5.  Pt started session by stating "I have not talked to you in over a year, but I just have one question for you?". Pt went onto to state "I recently buried my sons ashes in my backyard with a tree. Sometimes I just want to go and dig them up. Is that normal?". LCSW normalized grief feeling and went onto to explain the stages of grief. Stages of grief can go out of order and some stages can be  skipped. Pt reported relief from 16 minutes of session. Pt ended by saying "Well I do not mean to cut session short, but my dad nurse is calling. I will call you if I need anything else" Pt then hung up the phone    Suicidal/Homicidal: NAwithout intent/plan  Therapist Response:    Intervention/Plan:  LCSW utilized supportive and person center therapy for genuineness, praise, encouragement, and unconditional positive regard.   Plan: F/u if needed.     Weber Cooks, LCSW 04/13/2021

## 2021-04-14 ENCOUNTER — Encounter (HOSPITAL_COMMUNITY): Payer: Self-pay | Admitting: Physician Assistant

## 2021-04-14 ENCOUNTER — Telehealth (HOSPITAL_COMMUNITY): Payer: Self-pay | Admitting: *Deleted

## 2021-04-14 NOTE — Telephone Encounter (Signed)
VM left for writer stating she had spoken recently to her provider and he had indicated to her he might have samples of medicine for her ADHD, he would speak first with the nurses. I am unaware of the situation she is referring to. Will reach out to [facility] for clarification and if there is something I can help her with.

## 2021-04-17 NOTE — Telephone Encounter (Signed)
Provider was contacted by Orpah Clinton. Reola Calkins, RN regarding voicemail left by patient. Patient was thoroughly informed that our facility is currently waiting on samples of the medications that is to be prescribed to her. Patient was informed that she would be notified when the medications would be available for pick up.

## 2021-04-21 ENCOUNTER — Telehealth (HOSPITAL_COMMUNITY): Payer: Self-pay | Admitting: *Deleted

## 2021-04-21 ENCOUNTER — Other Ambulatory Visit: Payer: Self-pay

## 2021-04-21 NOTE — Telephone Encounter (Signed)
Third time patient has called in 10 days, she is seeking medicine for ADHD she said she discussed with Link Snuffer PA her provider. She was expecting samples but they have not come in and unsure if and when they will. If she does well on the samples if and when they come, unsure they are sustainable with her insurance. Will forward concern to Eddie to decide if there is an alternative to [place] which is what she is waiting on.

## 2021-04-24 ENCOUNTER — Other Ambulatory Visit: Payer: Self-pay

## 2021-05-01 ENCOUNTER — Emergency Department (HOSPITAL_COMMUNITY): Payer: Self-pay

## 2021-05-01 ENCOUNTER — Encounter (HOSPITAL_COMMUNITY): Payer: Self-pay

## 2021-05-01 ENCOUNTER — Other Ambulatory Visit: Payer: Self-pay

## 2021-05-01 ENCOUNTER — Inpatient Hospital Stay (HOSPITAL_COMMUNITY)
Admission: EM | Admit: 2021-05-01 | Discharge: 2021-05-04 | DRG: 917 | Disposition: A | Discharge status: Home / Self Care | Payer: Self-pay | Attending: Internal Medicine | Admitting: Internal Medicine

## 2021-05-01 DIAGNOSIS — F1410 Cocaine abuse, uncomplicated: Secondary | ICD-10-CM | POA: Diagnosis present

## 2021-05-01 DIAGNOSIS — K589 Irritable bowel syndrome without diarrhea: Secondary | ICD-10-CM | POA: Diagnosis present

## 2021-05-01 DIAGNOSIS — F909 Attention-deficit hyperactivity disorder, unspecified type: Secondary | ICD-10-CM | POA: Diagnosis present

## 2021-05-01 DIAGNOSIS — M1990 Unspecified osteoarthritis, unspecified site: Secondary | ICD-10-CM | POA: Diagnosis present

## 2021-05-01 DIAGNOSIS — F17210 Nicotine dependence, cigarettes, uncomplicated: Secondary | ICD-10-CM | POA: Diagnosis present

## 2021-05-01 DIAGNOSIS — F410 Panic disorder [episodic paroxysmal anxiety] without agoraphobia: Secondary | ICD-10-CM | POA: Diagnosis present

## 2021-05-01 DIAGNOSIS — G928 Other toxic encephalopathy: Secondary | ICD-10-CM | POA: Diagnosis present

## 2021-05-01 DIAGNOSIS — Z7151 Drug abuse counseling and surveillance of drug abuser: Secondary | ICD-10-CM

## 2021-05-01 DIAGNOSIS — G9341 Metabolic encephalopathy: Secondary | ICD-10-CM | POA: Diagnosis present

## 2021-05-01 DIAGNOSIS — R4182 Altered mental status, unspecified: Secondary | ICD-10-CM | POA: Insufficient documentation

## 2021-05-01 DIAGNOSIS — G9340 Encephalopathy, unspecified: Secondary | ICD-10-CM | POA: Insufficient documentation

## 2021-05-01 DIAGNOSIS — D696 Thrombocytopenia, unspecified: Secondary | ICD-10-CM | POA: Diagnosis present

## 2021-05-01 DIAGNOSIS — Z791 Long term (current) use of non-steroidal anti-inflammatories (NSAID): Secondary | ICD-10-CM

## 2021-05-01 DIAGNOSIS — F1910 Other psychoactive substance abuse, uncomplicated: Secondary | ICD-10-CM | POA: Insufficient documentation

## 2021-05-01 DIAGNOSIS — F1510 Other stimulant abuse, uncomplicated: Secondary | ICD-10-CM | POA: Diagnosis present

## 2021-05-01 DIAGNOSIS — Z20822 Contact with and (suspected) exposure to covid-19: Secondary | ICD-10-CM | POA: Diagnosis present

## 2021-05-01 DIAGNOSIS — I468 Cardiac arrest due to other underlying condition: Secondary | ICD-10-CM | POA: Diagnosis present

## 2021-05-01 DIAGNOSIS — F4310 Post-traumatic stress disorder, unspecified: Secondary | ICD-10-CM | POA: Diagnosis present

## 2021-05-01 DIAGNOSIS — K219 Gastro-esophageal reflux disease without esophagitis: Secondary | ICD-10-CM | POA: Diagnosis present

## 2021-05-01 DIAGNOSIS — F5105 Insomnia due to other mental disorder: Secondary | ICD-10-CM | POA: Diagnosis present

## 2021-05-01 DIAGNOSIS — J9601 Acute respiratory failure with hypoxia: Secondary | ICD-10-CM | POA: Diagnosis present

## 2021-05-01 DIAGNOSIS — I469 Cardiac arrest, cause unspecified: Secondary | ICD-10-CM | POA: Diagnosis present

## 2021-05-01 DIAGNOSIS — F17200 Nicotine dependence, unspecified, uncomplicated: Secondary | ICD-10-CM | POA: Diagnosis present

## 2021-05-01 DIAGNOSIS — F32A Depression, unspecified: Secondary | ICD-10-CM | POA: Diagnosis present

## 2021-05-01 DIAGNOSIS — T50901A Poisoning by unspecified drugs, medicaments and biological substances, accidental (unintentional), initial encounter: Principal | ICD-10-CM | POA: Diagnosis present

## 2021-05-01 DIAGNOSIS — R509 Fever, unspecified: Secondary | ICD-10-CM

## 2021-05-01 DIAGNOSIS — F1920 Other psychoactive substance dependence, uncomplicated: Secondary | ICD-10-CM | POA: Diagnosis present

## 2021-05-01 DIAGNOSIS — Z79899 Other long term (current) drug therapy: Secondary | ICD-10-CM

## 2021-05-01 DIAGNOSIS — F419 Anxiety disorder, unspecified: Secondary | ICD-10-CM | POA: Diagnosis present

## 2021-05-01 DIAGNOSIS — J690 Pneumonitis due to inhalation of food and vomit: Secondary | ICD-10-CM | POA: Diagnosis present

## 2021-05-01 DIAGNOSIS — F1010 Alcohol abuse, uncomplicated: Secondary | ICD-10-CM | POA: Diagnosis present

## 2021-05-01 DIAGNOSIS — T50991A Poisoning by other drugs, medicaments and biological substances, accidental (unintentional), initial encounter: Principal | ICD-10-CM | POA: Diagnosis present

## 2021-05-01 DIAGNOSIS — F19229 Other psychoactive substance dependence with intoxication, unspecified: Secondary | ICD-10-CM | POA: Diagnosis present

## 2021-05-01 DIAGNOSIS — Z885 Allergy status to narcotic agent status: Secondary | ICD-10-CM

## 2021-05-01 DIAGNOSIS — F250 Schizoaffective disorder, bipolar type: Secondary | ICD-10-CM | POA: Diagnosis present

## 2021-05-01 DIAGNOSIS — G4700 Insomnia, unspecified: Secondary | ICD-10-CM | POA: Diagnosis present

## 2021-05-01 DIAGNOSIS — Z79891 Long term (current) use of opiate analgesic: Secondary | ICD-10-CM

## 2021-05-01 LAB — CBC WITH DIFFERENTIAL/PLATELET
Abs Immature Granulocytes: 0.07 10*3/uL (ref 0.00–0.07)
Basophils Absolute: 0 10*3/uL (ref 0.0–0.1)
Basophils Relative: 0 %
Eosinophils Absolute: 0.1 10*3/uL (ref 0.0–0.5)
Eosinophils Relative: 0 %
HCT: 38.5 % (ref 36.0–46.0)
Hemoglobin: 12.6 g/dL (ref 12.0–15.0)
Immature Granulocytes: 1 %
Lymphocytes Relative: 16 %
Lymphs Abs: 1.9 10*3/uL (ref 0.7–4.0)
MCH: 31.2 pg (ref 26.0–34.0)
MCHC: 32.7 g/dL (ref 30.0–36.0)
MCV: 95.3 fL (ref 80.0–100.0)
Monocytes Absolute: 0.2 10*3/uL (ref 0.1–1.0)
Monocytes Relative: 2 %
Neutro Abs: 9.8 10*3/uL — ABNORMAL HIGH (ref 1.7–7.7)
Neutrophils Relative %: 81 %
Platelets: 192 10*3/uL (ref 150–400)
RBC: 4.04 MIL/uL (ref 3.87–5.11)
RDW: 14.8 % (ref 11.5–15.5)
WBC: 12.1 10*3/uL — ABNORMAL HIGH (ref 4.0–10.5)
nRBC: 0 % (ref 0.0–0.2)

## 2021-05-01 LAB — HIV ANTIBODY (ROUTINE TESTING W REFLEX): HIV Screen 4th Generation wRfx: NONREACTIVE

## 2021-05-01 LAB — BRAIN NATRIURETIC PEPTIDE: B Natriuretic Peptide: 17.7 pg/mL (ref 0.0–100.0)

## 2021-05-01 LAB — ETHANOL: Alcohol, Ethyl (B): <10 mg/dL (ref <10)

## 2021-05-01 LAB — GLUCOSE, CAPILLARY: Glucose-Capillary: 123 mg/dL — ABNORMAL HIGH (ref 70–99)

## 2021-05-01 LAB — COMPREHENSIVE METABOLIC PANEL
ALT: 12 U/L (ref 0–44)
AST: 16 U/L (ref 15–41)
Albumin: 3.6 g/dL (ref 3.5–5.0)
Alkaline Phosphatase: 75 U/L (ref 38–126)
Anion gap: 8 (ref 5–15)
BUN: 14 mg/dL (ref 6–20)
CO2: 26 mmol/L (ref 22–32)
Calcium: 8.8 mg/dL — ABNORMAL LOW (ref 8.9–10.3)
Chloride: 104 mmol/L (ref 98–111)
Creatinine, Ser: 1.14 mg/dL — ABNORMAL HIGH (ref 0.44–1.00)
GFR, Estimated: 58 mL/min — ABNORMAL LOW (ref >60)
Glucose, Bld: 123 mg/dL — ABNORMAL HIGH (ref 70–99)
Potassium: 4 mmol/L (ref 3.5–5.1)
Sodium: 138 mmol/L (ref 135–145)
Total Bilirubin: 0.4 mg/dL (ref 0.3–1.2)
Total Protein: 6.9 g/dL (ref 6.5–8.1)

## 2021-05-01 LAB — I-STAT CHEM 8, ED
BUN: 16 mg/dL (ref 6–20)
Calcium, Ion: 1.21 mmol/L (ref 1.15–1.40)
Chloride: 103 mmol/L (ref 98–111)
Creatinine, Ser: 1.1 mg/dL — ABNORMAL HIGH (ref 0.44–1.00)
Glucose, Bld: 131 mg/dL — ABNORMAL HIGH (ref 70–99)
HCT: 40 % (ref 36.0–46.0)
Hemoglobin: 13.6 g/dL (ref 12.0–15.0)
Potassium: 4.1 mmol/L (ref 3.5–5.1)
Sodium: 140 mmol/L (ref 135–145)
TCO2: 27 mmol/L (ref 22–32)

## 2021-05-01 LAB — CBC
HCT: 40.1 % (ref 36.0–46.0)
Hemoglobin: 13.1 g/dL (ref 12.0–15.0)
MCH: 31.1 pg (ref 26.0–34.0)
MCHC: 32.7 g/dL (ref 30.0–36.0)
MCV: 95.2 fL (ref 80.0–100.0)
Platelets: 140 10*3/uL — ABNORMAL LOW (ref 150–400)
RBC: 4.21 MIL/uL (ref 3.87–5.11)
RDW: 13.8 % (ref 11.5–15.5)
WBC: 12.7 10*3/uL — ABNORMAL HIGH (ref 4.0–10.5)
nRBC: 0 % (ref 0.0–0.2)

## 2021-05-01 LAB — RESP PANEL BY RT-PCR (FLU A&B, COVID) ARPGX2
Influenza A by PCR: NEGATIVE
Influenza B by PCR: NEGATIVE
SARS Coronavirus 2 by RT PCR: NEGATIVE

## 2021-05-01 LAB — RAPID URINE DRUG SCREEN, HOSP PERFORMED
Amphetamines: POSITIVE — AB
Barbiturates: NOT DETECTED
Benzodiazepines: POSITIVE — AB
Cocaine: POSITIVE — AB
Opiates: NOT DETECTED
Tetrahydrocannabinol: NOT DETECTED

## 2021-05-01 LAB — ACETAMINOPHEN LEVEL: Acetaminophen (Tylenol), Serum: <10 ug/mL — ABNORMAL LOW (ref 10–30)

## 2021-05-01 LAB — PROCALCITONIN: Procalcitonin: 0.1 ng/mL

## 2021-05-01 MED ORDER — METHYLPREDNISOLONE SODIUM SUCC 125 MG IJ SOLR
125.0000 mg | Freq: Once | INTRAMUSCULAR | Status: AC
  Administered 2021-05-01: 125 mg via INTRAVENOUS
  Filled 2021-05-01: qty 2

## 2021-05-01 MED ORDER — NALOXONE HCL 0.4 MG/ML IJ SOLN: 0.4000 mg | INTRAMUSCULAR | Status: DC | PRN

## 2021-05-01 MED ORDER — NALOXONE HCL 2 MG/2ML IJ SOSY
PREFILLED_SYRINGE | INTRAMUSCULAR | Status: AC
  Administered 2021-05-01: 1 mg via INTRAVENOUS
  Filled 2021-05-01: qty 2

## 2021-05-01 MED ORDER — ONDANSETRON HCL 4 MG/2ML IJ SOLN: 4.0000 mg | Freq: Four times a day (QID) | INTRAMUSCULAR | Status: DC | PRN

## 2021-05-01 MED ORDER — NALOXONE HCL 2 MG/2ML IJ SOSY
1.0000 mg | PREFILLED_SYRINGE | Freq: Once | INTRAMUSCULAR | Status: AC
  Administered 2021-05-01: 1 mg via INTRAVENOUS
  Filled 2021-05-01: qty 2

## 2021-05-01 MED ORDER — SODIUM CHLORIDE 0.9 % IV SOLN
3.0000 g | Freq: Three times a day (TID) | INTRAVENOUS | Status: DC
  Administered 2021-05-01 – 2021-05-04 (×9): 3 g via INTRAVENOUS
  Filled 2021-05-01 (×12): qty 8

## 2021-05-01 MED ORDER — IPRATROPIUM-ALBUTEROL 0.5-2.5 (3) MG/3ML IN SOLN
3.0000 mL | Freq: Once | RESPIRATORY_TRACT | Status: AC
  Administered 2021-05-01: 3 mL via RESPIRATORY_TRACT
  Filled 2021-05-01: qty 3

## 2021-05-01 MED ORDER — THIAMINE HCL 100 MG/ML IJ SOLN
100.0000 mg | Freq: Every day | INTRAMUSCULAR | Status: DC
  Administered 2021-05-01 – 2021-05-02 (×2): 100 mg via INTRAVENOUS
  Filled 2021-05-01 (×3): qty 2

## 2021-05-01 MED ORDER — POLYETHYLENE GLYCOL 3350 17 G PO PACK: 17.0000 g | PACK | Freq: Every day | ORAL | Status: DC | PRN

## 2021-05-01 MED ORDER — ONDANSETRON HCL 4 MG/2ML IJ SOLN
4.0000 mg | Freq: Once | INTRAMUSCULAR | Status: AC
  Administered 2021-05-01: 4 mg via INTRAVENOUS
  Filled 2021-05-01: qty 2

## 2021-05-01 MED ORDER — NALOXONE HCL 4 MG/10ML IJ SOLN
0.5000 mg/h | INTRAVENOUS | Status: DC
  Filled 2021-05-01: qty 10

## 2021-05-01 MED ORDER — DOCUSATE SODIUM 100 MG PO CAPS: 100.0000 mg | ORAL_CAPSULE | Freq: Two times a day (BID) | ORAL | Status: DC | PRN

## 2021-05-01 MED ORDER — NALOXONE HCL 2 MG/2ML IJ SOSY: 1.0000 mg | PREFILLED_SYRINGE | Freq: Once | INTRAMUSCULAR | Status: AC

## 2021-05-01 MED ORDER — FOLIC ACID 5 MG/ML IJ SOLN
1.0000 mg | Freq: Every day | INTRAMUSCULAR | Status: DC
  Administered 2021-05-01: 1 mg via INTRAVENOUS
  Filled 2021-05-01 (×5): qty 0.2

## 2021-05-01 MED ORDER — ALBUTEROL SULFATE (2.5 MG/3ML) 0.083% IN NEBU
2.5000 mg | INHALATION_SOLUTION | Freq: Once | RESPIRATORY_TRACT | Status: AC
  Administered 2021-05-01: 2.5 mg via RESPIRATORY_TRACT
  Filled 2021-05-01: qty 3

## 2021-05-01 MED ORDER — CHLORHEXIDINE GLUCONATE 0.12 % MT SOLN
15.0000 mL | Freq: Two times a day (BID) | OROMUCOSAL | Status: DC
  Administered 2021-05-01 – 2021-05-02 (×3): 15 mL via OROMUCOSAL
  Filled 2021-05-01 (×2): qty 15

## 2021-05-01 MED ORDER — SODIUM CHLORIDE 0.9 % IV BOLUS
500.0000 mL | Freq: Once | INTRAVENOUS | Status: AC
  Administered 2021-05-01: 500 mL via INTRAVENOUS

## 2021-05-01 MED ORDER — ORAL CARE MOUTH RINSE
15.0000 mL | Freq: Two times a day (BID) | OROMUCOSAL | Status: DC
  Administered 2021-05-02 (×2): 15 mL via OROMUCOSAL

## 2021-05-01 MED ORDER — ENOXAPARIN SODIUM 40 MG/0.4ML IJ SOSY
40.0000 mg | PREFILLED_SYRINGE | INTRAMUSCULAR | Status: DC
  Administered 2021-05-01 – 2021-05-03 (×3): 40 mg via SUBCUTANEOUS
  Filled 2021-05-01 (×3): qty 0.4

## 2021-05-01 NOTE — ED Triage Notes (Signed)
Patient brought in via ems from home. Patient was found by family on front porch unresponsive. Patient denies OD, fire gave 2mg  narcan nasal with some results. Patient continues to have coughing and vomiting episodes.

## 2021-05-01 NOTE — ED Provider Notes (Signed)
[facility] EMERGENCY DEPARTMENT Provider Note   CSN: [id number] Arrival date & time: 05/01/21  1022     History Chief Complaint  Patient presents with   Drug Overdose    [name] is a 50 y.o. female.  Patient was found unresponsive in a chair.  She was found by her daughter.  Her daughter started CPR with compressions.  The patient vomited while she was giving breaths.  Paramedics arrived and gave her some Narcan and she awoke some.  The history is provided by the patient and a relative.  Drug Overdose This is a recurrent problem. The current episode started 1 to 2 hours ago. The problem occurs rarely. The problem has been gradually improving. Associated symptoms include chest pain. Nothing aggravates the symptoms. Nothing relieves the symptoms.      Past Medical History:  Diagnosis Date   Alcohol abuse    Anxiety    Arthritis    Bipolar disorder (HCC)    Depression    GERD (gastroesophageal reflux disease)    H/O hiatal hernia    Hemorrhoids    Small bowel obstruction (HCC) 2012   Wolf-Parkinson-White syndrome    Wolf-Parkinson-White syndrome    per patient    Patient Active Problem List   Diagnosis Date Noted   Attention deficit hyperactivity disorder (ADHD) 12/08/2020   Panic attacks 06/29/2020   Insomnia due to other mental disorder 06/29/2020   PTSD (post-traumatic stress disorder) 06/29/2020   Schizoaffective disorder, bipolar type (HCC) 06/29/2020   Irritable bowel syndrome with constipation 08/16/2011   Bleeding internal hemorrhoids 04/23/2011   LUQ pain 04/19/2011   Smoker 04/09/2011   Alcohol abuse 04/09/2011    Past Surgical History:  Procedure Laterality Date   ESOPHAGOGASTRODUODENOSCOPY  06/07/2011   Procedure: ESOPHAGOGASTRODUODENOSCOPY (EGD);  Surgeon: Rob Bunting, MD;  Location: [facility];  Service: Endoscopy;  Laterality: N/A;   TUBAL LIGATION       OB History   No obstetric history on file.     Family  History  Problem Relation Age of Onset   Breast cancer Maternal Aunt    Colon polyps Maternal Grandmother    Diabetes Maternal Grandmother    Heart attack Maternal Grandmother    Kidney cancer Maternal Grandmother    Breast cancer Maternal Grandmother    Colon cancer Neg Hx    Anesthesia problems Neg Hx    Hypotension Neg Hx    Malignant hyperthermia Neg Hx    Pseudochol deficiency Neg Hx    Heart disease Father    Heart disease Paternal Grandfather    Heart disease Paternal Grandmother     Social History   Tobacco Use   Smoking status: Every Day    Packs/day: 1.00    Types: Cigarettes   Smokeless tobacco: Never  Substance Use Topics   Alcohol use: No    Comment: previous alcohol abuse but for last 2 weeks 2 drinks. not currently drinking alcohol   Drug use: No    Home Medications Prior to Admission medications   Medication Sig Start Date End Date Taking? Authorizing Provider  albuterol (PROVENTIL HFA;VENTOLIN HFA) 108 (90 BASE) MCG/ACT inhaler Inhale 2 puffs into the lungs every 6 (six) hours as needed for shortness of breath (panic attacks). Patient not taking: Reported on 05/27/2020    [provider]  amphetamine-dextroamphetamine (ADDERALL XR) 30 MG 24 hr capsule Take 30 mg by mouth 2 (two) times daily. Patient not taking: Reported on 05/27/2020    [provider]  amphetamine-dextroamphetamine (ADDERALL) 10 MG tablet Take 10 mg by mouth daily with breakfast. Patient not taking: Reported on 05/27/2020    [provider]  atomoxetine (STRATTERA) 40 MG capsule Take 1 capsule (40 mg total) by mouth daily. 03/24/21   Shanna Cisco, NP  benztropine (COGENTIN) 0.5 MG tablet TAKE 1 TABLET (0.5 MG TOTAL) BY MOUTH 2 (TWO) TIMES DAILY. 12/31/20 12/31/21  [name], [name], PA  clonazePAM (KLONOPIN) 1 MG tablet Take 1 tablet (1 mg total) by mouth 2 (two) times daily as needed for anxiety. 04/11/21 04/11/22  [name], [name], PA  cyclobenzaprine (FLEXERIL)  10 MG tablet Take 1 tablet (10 mg total) by mouth 2 (two) times daily as needed for muscle spasms. Patient not taking: Reported on 05/27/2020 04/19/14   Marlon Pel, PA-C  diclofenac (VOLTAREN) 75 MG EC tablet Take 75 mg by mouth 2 (two) times daily. Patient not taking: Reported on 05/27/2020    [provider]  escitalopram (LEXAPRO) 20 MG tablet Take 2 tablets (40 mg total) by mouth daily. 04/11/21   [name], [name], PA  esomeprazole (NEXIUM) 20 MG capsule Take 20 mg by mouth daily before breakfast. Patient not taking: Reported on 05/27/2020    [provider]  HYDROcodone-acetaminophen (NORCO/VICODIN) 5-325 MG per tablet Take 1 tablet by mouth every 4 (four) hours as needed for moderate pain or severe pain. Patient not taking: Reported on 05/27/2020 05/31/14   Trixie Dredge, PA-C  ibuprofen (ADVIL,MOTRIN) 800 MG tablet Take 1 tablet (800 mg total) by mouth 3 (three) times daily. Patient not taking: Reported on 05/27/2020 05/13/14   Piepenbrink, Victorino Dike, PA-C  methocarbamol (ROBAXIN) 500 MG tablet Take 1 tablet (500 mg total) by mouth 2 (two) times daily. Patient not taking: Reported on 05/27/2020 04/13/14   Santiago Glad, PA-C  methocarbamol (ROBAXIN) 500 MG tablet Take 1 tablet (500 mg total) by mouth 2 (two) times daily. Patient not taking: Reported on 05/27/2020 05/13/14   Piepenbrink, Victorino Dike, PA-C  naproxen (NAPROSYN) 500 MG tablet Take 1 tablet (500 mg total) by mouth 2 (two) times daily. Patient not taking: Reported on 05/27/2020 04/13/14   Santiago Glad, PA-C  oxycodone (OXY-IR) 5 MG capsule Take 1 capsule (5 mg total) by mouth every 4 (four) hours as needed. Patient not taking: Reported on 05/27/2020 04/19/14   Marlon Pel, PA-C  Oxycodone HCl 10 MG TABS Take 10 mg by mouth every 8 (eight) hours as needed for severe pain. 03/28/21   [provider]  oxyCODONE-acetaminophen (PERCOCET/ROXICET) 5-325 MG per tablet Take 1-2 tablets by mouth every 6 (six)  hours as needed for severe pain. Patient not taking: Reported on 05/27/2020 04/13/14   Santiago Glad, PA-C  risperiDONE (RISPERDAL) 1 MG tablet TAKE 1 TABLET (1 MG TOTAL) BY MOUTH IN THE MORNING. 04/11/21 04/11/22  [name], Stephens Shire E, PA  risperiDONE (RISPERDAL) 2 MG tablet TAKE 1 TABLET (2 MG TOTAL) BY MOUTH AT BEDTIME. 04/11/21 04/11/22  [name], [name], PA  traZODone (DESYREL) 150 MG tablet Take 150 mg by mouth at bedtime as needed for sleep.    [provider]  trazodone (DESYREL) 300 MG tablet Take 1 tablet (300 mg total) by mouth at bedtime. 04/11/21   [name], [name], PA  triamcinolone cream (KENALOG) 0.1 % Apply 1 application topically 2 (two) times daily. Patient not taking: Reported on 05/27/2020    [provider]    Allergies    Codeine  Review of Systems   Review of Systems  Unable to perform ROS: Mental status change  Cardiovascular:  Positive for chest pain.   Physical Exam Updated Vital Signs BP 124/80   Pulse 92   Temp (!) 97.1 F (36.2 C) (Axillary)   Resp (!) 23   SpO2 99%   Physical Exam Vitals and nursing note reviewed.  Constitutional:      Appearance: She is well-developed.     Comments: Lethargic  HENT:     Head: Normocephalic.     Nose: Nose normal.  Eyes:     General: No scleral icterus.    Conjunctiva/sclera: Conjunctivae normal.  Neck:     Thyroid: No thyromegaly.  Cardiovascular:     Rate and Rhythm: Normal rate and regular rhythm.     Heart sounds: No murmur heard.   No friction rub. No gallop.  Pulmonary:     Breath sounds: No stridor. No wheezing or rales.  Chest:     Chest wall: No tenderness.  Abdominal:     General: There is no distension.     Tenderness: There is no abdominal tenderness. There is no rebound.  Musculoskeletal:        General: Normal range of motion.     Cervical back: Neck supple.  Lymphadenopathy:     Cervical: No cervical adenopathy.  Skin:    Findings: No erythema or rash.  Neurological:      Motor: No abnormal muscle tone.     Coordination: Coordination normal.     Comments: Patient is oriented to person and place.  Patient can be aroused with painful stimuli.  Psychiatric:        Behavior: Behavior normal.    ED Results / Procedures / Treatments   Labs (all labs ordered are listed, but only abnormal results are displayed) Labs Reviewed  CBC WITH DIFFERENTIAL/PLATELET - Abnormal; Notable for the following components:      Result Value   WBC 12.1 (*)    Neutro Abs 9.8 (*)    All other components within normal limits  RESP PANEL BY RT-PCR (FLU A&B, COVID) ARPGX2  RAPID URINE DRUG SCREEN, HOSP PERFORMED  ACETAMINOPHEN LEVEL  COMPREHENSIVE METABOLIC PANEL  ETHANOL  I-STAT CHEM 8, ED  I-STAT CHEM 8, ED    EKG None  Radiology DG Chest Port 1 View  Result Date: 05/01/2021 CLINICAL DATA:  Short of breath and cough EXAM: PORTABLE CHEST 1 VIEW COMPARISON:  04/01/2009 FINDINGS: The heart size and mediastinal contours are within normal limits. Both lungs are clear. The visualized skeletal structures are unremarkable. IMPRESSION: No active disease. Electronically Signed   By: Signa Kell M.D.   On: 05/01/2021 11:16    Procedures Procedures   Medications Ordered in ED Medications  naloxone HCl (NARCAN) 4 mg in dextrose 5 % 250 mL infusion (has no administration in time range)  naloxone [facility]) injection 1 mg (1 mg Intravenous Given 05/01/21 1040)  ondansetron (ZOFRAN) injection 4 mg (4 mg Intravenous Given 05/01/21 1057)  sodium chloride 0.9 % bolus 500 mL (500 mLs Intravenous Bolus 05/01/21 1059)  methylPREDNISolone sodium succinate (SOLU-MEDROL) 125 mg/2 mL injection 125 mg (125 mg Intravenous Given 05/01/21 1059)  ipratropium-albuterol (DUONEB) 0.5-2.5 (3) MG/3ML nebulizer solution 3 mL (3 mLs Nebulization Given 05/01/21 1050)  albuterol (PROVENTIL) (2.5 MG/3ML) 0.083% nebulizer solution 2.5 mg (2.5 mg Nebulization Given 05/01/21 1050)  naloxone (NARCAN) injection 1 mg  (1 mg Intravenous Given 05/01/21 1414)    ED Course  I have reviewed the triage vital signs and the nursing  notes.  Pertinent labs & imaging results that were available during my care of the patient were reviewed by me and considered in my medical decision making (see chart for details). CRITICAL CARE Performed by: Bethann Berkshire Total critical care time: 40 minutes Critical care time was exclusive of separately billable procedures and treating other patients. Critical care was necessary to treat or prevent imminent or life-threatening deterioration. Critical care was time spent personally by me on the following activities: development of treatment plan with patient and/or surrogate as well as nursing, discussions with consultants, evaluation of patient's response to treatment, examination of patient, obtaining history from patient or surrogate, ordering and performing treatments and interventions, ordering and review of laboratory studies, ordering and review of radiographic studies, pulse oximetry and re-evaluation of patient's condition. Patient has been given Narcan several times and awakes and then falls back to sleep.  She is now put on Narcan drip   MDM Rules/Calculators/A&P                           Patient will be admitted for opiate overdose by critical care Final Clinical Impression(s) / ED Diagnoses Final diagnoses:  Accidental overdose, initial encounter    Rx / DC Orders ED Discharge Orders     None        Bethann Berkshire, MD 05/01/21 1542

## 2021-05-01 NOTE — ED Notes (Signed)
Md at bedside pt is now awake and drowsy but able to have conversation with MD and talk to family

## 2021-05-01 NOTE — H&P (Signed)
NAME:  [name], MRN:  [id number], DOB:  September 16, 1970, LOS: 0 ADMISSION DATE:  05/01/2021, CONSULTATION DATE:  05/01/21 REFERRING MD:  Estell Harpin CHIEF COMPLAINT:  AMS   History of Present Illness:  [name] is a 50 y.o. female who has a PMH as outlined below including long standing polysubstance abuse.  She was found sitting on her porch in rocking chair unresponsive by family 10/3.  They could not wake her up and when daughter went downstairs, she noticed arms and legs turning blue; therefore, she moved her to the ground and called EMS.  EMS advised to start CPR though its not clear that she ever lost pulses. During CPR, pt vomited with rescue breaths.  EMS arrived and administered 1mg  Narcan with minimal response per daughter.  She was brought to ED where she received an additional 2 doses of 1mg  Narcan each.  With each dose, she had minimal improvement in mental status.  Daughter states her waxing and waning mental status is completely normal behavior for her.  We questioned her multiple times to ensure that this was true and on each occasion, daughter adamant that this is normal.  Per daughter, pt has been active drug user for some 30 years.  Typically cocaine though does have hx of heroine in the past.  Daughter thinks that pt had a dose of cocaine last night that might have been laced with Fentanyl.  Pt tell that shes unsure if it was laced, but does remember taking 1 pill of Roxycodone (prescribed).  Pertinent  Medical History:  has Smoker; Alcohol abuse; LUQ pain; Bleeding internal hemorrhoids; Irritable bowel syndrome with constipation; Panic attacks; Insomnia due to other mental disorder; PTSD (post-traumatic stress disorder); Schizoaffective disorder, bipolar type (HCC); Attention deficit hyperactivity disorder (ADHD); and Cardiac arrest [facility]) on their problem list.  Significant Hospital Events: Including procedures, antibiotic start and stop dates in addition to other pertinent events    10/3 > admit.  Interim History / Subjective:  Waxing and waning mental status.  Objective:  Blood pressure 124/78, pulse 88, temperature (!) 97.1 F (36.2 C), temperature source Axillary, resp. rate (!) 24, SpO2 100 %.       No intake or output data in the 24 hours ending 05/01/21 1635 There were no vitals filed for this visit.  Examination: General: Adult female, somnolent, in NAD. Neuro: Somnolent with waxing and waning mental status.  When awakened she can tell [facility] her name and where she is. HEENT: /AT. Sclerae anicteric.  Pupils small and reactive (though not pinpoint). Cardiovascular: RRR, no M/R/G.  Lungs: Respirations even and unlabored.  Coarse bilaterally. Abdomen: BS x 4, soft, NT/ND.  Musculoskeletal: No gross deformities, no edema.  Skin: Intact, warm, no rashes.  Labs/imaging personally reviewed:  CT head 10/3 > no intracranial process, possible acute sinusitis.  Resolved Hospital Problem list:    Assessment & Plan:   Possible drug overdose - unsure whether cocaine was laced or not per pt / daughter. AMS - 2/2 above.  Head CT neg, UDS pos for amphetamines (prescribed), benzos (prescribed), cocaine.  Per daughter, this is pt's typical behavior when she uses drugs. Hx polysubstance abuse, EtOH abuse, anxiety, bipolar disorder, depression. Concern for polypharmacy - home med list has multiple duplicates of similar meds / same class.  Unsure which meds she is actually taking versus old list, etc. - Admit to ICU for closer obs. - Avoid sedating meds. - Narcan PRN, hold off on gtt for now. - Polysubstance abuse  counseling once mental status improves. - Thiamine / Folate. - Hold home Adderall XR, Adderall, Strattera, Cogentin, Clonazepam, Cyclobenzaprine, Escitalopram, Norco, Robaxin, Oxy, Risperidone, Trazodone. - Needs pharmacy med rec prior to discharge to avoid polypharmacy!!!  Possible aspiration PNA. - Continue empiric Unasyn. - Follow cultures.   Best  practice (evaluated daily):  Diet/type: NPO DVT prophylaxis: LMWH GI prophylaxis: N/A Lines: N/A Foley:  N/A Code Status:  full code Last date of multidisciplinary goals of care discussion: None.  Labs   CBC: Recent Labs  Lab 05/01/21 1112  WBC 12.1*  NEUTROABS 9.8*  HGB 12.6  HCT 38.5  MCV 95.3  PLT 192    Basic Metabolic Panel: No results for input(s): NA, K, CL, CO2, GLUCOSE, BUN, CREATININE, CALCIUM, MG, PHOS in the last 168 hours. GFR: CrCl cannot be calculated (Patient's most recent lab result is older than the maximum 21 days allowed.). Recent Labs  Lab 05/01/21 1112  WBC 12.1*    Liver Function Tests: No results for input(s): AST, ALT, ALKPHOS, BILITOT, PROT, ALBUMIN in the last 168 hours. No results for input(s): LIPASE, AMYLASE in the last 168 hours. No results for input(s): AMMONIA in the last 168 hours.  ABG    Component Value Date/Time   TCO2 25 01/02/2009 2051     Coagulation Profile: No results for input(s): INR, PROTIME in the last 168 hours.  Cardiac Enzymes: No results for input(s): CKTOTAL, CKMB, CKMBINDEX, TROPONINI in the last 168 hours.  HbA1C: No results found for: HGBA1C  CBG: No results for input(s): GLUCAP in the last 168 hours.  Review of Systems:   Unable to obtain as pt is altered.  Past Medical History:  She,  has a past medical history of Alcohol abuse, Anxiety, Arthritis, Bipolar disorder (HCC), Depression, GERD (gastroesophageal reflux disease), H/O hiatal hernia, Hemorrhoids, Small bowel obstruction (HCC) (2012), Wolf-Parkinson-White syndrome, and Wolf-Parkinson-White syndrome.   Surgical History:   Past Surgical History:  Procedure Laterality Date   ESOPHAGOGASTRODUODENOSCOPY  06/07/2011   Procedure: ESOPHAGOGASTRODUODENOSCOPY (EGD);  Surgeon: Rob Bunting, MD;  Location: [facility];  Service: Endoscopy;  Laterality: N/A;   TUBAL LIGATION       Social History:   reports that she has been smoking cigarettes.  She has been smoking an average of 1 pack per day. She has never used smokeless tobacco. She reports that she does not drink alcohol and does not use drugs.   Family History:  Her family history includes Breast cancer in her maternal aunt and maternal grandmother; Colon polyps in her maternal grandmother; Diabetes in her maternal grandmother; Heart attack in her maternal grandmother; Heart disease in her father, paternal grandfather, and paternal grandmother; Kidney cancer in her maternal grandmother. There is no history of Colon cancer, Anesthesia problems, Hypotension, Malignant hyperthermia, or Pseudochol deficiency.   Allergies Allergies  Allergen Reactions   Codeine Nausea Only     Home Medications  Prior to Admission medications   Medication Sig Start Date End Date Taking? Authorizing Provider  albuterol (PROVENTIL HFA;VENTOLIN HFA) 108 (90 BASE) MCG/ACT inhaler Inhale 2 puffs into the lungs every 6 (six) hours as needed for shortness of breath (panic attacks). Patient not taking: Reported on 05/27/2020    [provider]  amphetamine-dextroamphetamine (ADDERALL XR) 30 MG 24 hr capsule Take 30 mg by mouth 2 (two) times daily. Patient not taking: Reported on 05/27/2020    [provider]  amphetamine-dextroamphetamine (ADDERALL) 10 MG tablet Take 10 mg by mouth daily with breakfast. Patient not taking:  Reported on 05/27/2020    [provider]  atomoxetine (STRATTERA) 40 MG capsule Take 1 capsule (40 mg total) by mouth daily. 03/24/21   Shanna Cisco, NP  benztropine (COGENTIN) 0.5 MG tablet TAKE 1 TABLET (0.5 MG TOTAL) BY MOUTH 2 (TWO) TIMES DAILY. 12/31/20 12/31/21  [name], [name], PA  clonazePAM (KLONOPIN) 1 MG tablet Take 1 tablet (1 mg total) by mouth 2 (two) times daily as needed for anxiety. 04/11/21 04/11/22  [name], [name], PA  cyclobenzaprine (FLEXERIL) 10 MG tablet Take 1 tablet (10 mg total) by mouth 2 (two) times daily as needed for muscle  spasms. Patient not taking: Reported on 05/27/2020 04/19/14   Marlon Pel, PA-C  diclofenac (VOLTAREN) 75 MG EC tablet Take 75 mg by mouth 2 (two) times daily. Patient not taking: Reported on 05/27/2020    [provider]  escitalopram (LEXAPRO) 20 MG tablet Take 2 tablets (40 mg total) by mouth daily. 04/11/21   [name], [name], PA  esomeprazole (NEXIUM) 20 MG capsule Take 20 mg by mouth daily before breakfast. Patient not taking: Reported on 05/27/2020    [provider]  HYDROcodone-acetaminophen (NORCO/VICODIN) 5-325 MG per tablet Take 1 tablet by mouth every 4 (four) hours as needed for moderate pain or severe pain. Patient not taking: Reported on 05/27/2020 05/31/14   Trixie Dredge, PA-C  ibuprofen (ADVIL,MOTRIN) 800 MG tablet Take 1 tablet (800 mg total) by mouth 3 (three) times daily. Patient not taking: Reported on 05/27/2020 05/13/14   Piepenbrink, Victorino Dike, PA-C  methocarbamol (ROBAXIN) 500 MG tablet Take 1 tablet (500 mg total) by mouth 2 (two) times daily. Patient not taking: Reported on 05/27/2020 04/13/14   Santiago Glad, PA-C  methocarbamol (ROBAXIN) 500 MG tablet Take 1 tablet (500 mg total) by mouth 2 (two) times daily. Patient not taking: Reported on 05/27/2020 05/13/14   Piepenbrink, Victorino Dike, PA-C  naproxen (NAPROSYN) 500 MG tablet Take 1 tablet (500 mg total) by mouth 2 (two) times daily. Patient not taking: Reported on 05/27/2020 04/13/14   Santiago Glad, PA-C  oxycodone (OXY-IR) 5 MG capsule Take 1 capsule (5 mg total) by mouth every 4 (four) hours as needed. Patient not taking: Reported on 05/27/2020 04/19/14   Marlon Pel, PA-C  Oxycodone HCl 10 MG TABS Take 10 mg by mouth every 8 (eight) hours as needed for severe pain. 03/28/21   [provider]  oxyCODONE-acetaminophen (PERCOCET/ROXICET) 5-325 MG per tablet Take 1-2 tablets by mouth every 6 (six) hours as needed for severe pain. Patient not taking: Reported on 05/27/2020 04/13/14    Santiago Glad, PA-C  risperiDONE (RISPERDAL) 1 MG tablet TAKE 1 TABLET (1 MG TOTAL) BY MOUTH IN THE MORNING. 04/11/21 04/11/22  [name], Stephens Shire E, PA  risperiDONE (RISPERDAL) 2 MG tablet TAKE 1 TABLET (2 MG TOTAL) BY MOUTH AT BEDTIME. 04/11/21 04/11/22  [name], [name], PA  traZODone (DESYREL) 150 MG tablet Take 150 mg by mouth at bedtime as needed for sleep.    [provider]  trazodone (DESYREL) 300 MG tablet Take 1 tablet (300 mg total) by mouth at bedtime. 04/11/21   [name], [name], PA  triamcinolone cream (KENALOG) 0.1 % Apply 1 application topically 2 (two) times daily. Patient not taking: Reported on 05/27/2020    [provider]     Critical care time: 35 min.   Rutherford Guys, PA - [facility] For pager details, please see AMION or use Epic chat  After 1900, please  call [facility] for cross coverage needs 05/01/2021, 4:35 PM

## 2021-05-01 NOTE — ED Notes (Signed)
Ekg leads being changed out unable to pick up leads for EKG husband signed both forms MSE and has stated that this happens a lot and when she feels better he says she will leave.  Husband also states that she takes a lot of pills and at times has not wanted to live.  Daughter who Is at bedside stated that those thoughts she was having were from a long time ago

## 2021-05-01 NOTE — Progress Notes (Signed)
[name] [id number] Admission Data: 05/01/2021 6:35 PM Attending Provider: Cheri Fowler, MD  JOI:[id number], No Pcp Per (Inactive) Consults/ Treatment Team:   [name] is a 50 y.o. female patient admitted from ED awake, alert  & orientated  X 3,  Full Code, VSS - Blood pressure 132/77, pulse 83, temperature 97.7 F (36.5 C), temperature source Oral, resp. rate 20, SpO2 100 %., O2    5 L nasal cannular, no c/o shortness of breath, no c/o chest pain, no distress noted. Tele # [id number] placed and pt is currently running:normal sinus rhythm.   IV site WDL:  hand right, condition patent and no redness and left, condition patent and no redness and forearm right, condition patent and no redness and left, condition patent and no redness with a transparent dsg that's clean dry and intact.  Skin, clean-dry- intact without evidence of bruising, or skin tears.   No evidence of skin break down noted on exam.     Will cont to monitor and assist as needed.  Phineas Douglas, [place] 05/01/2021 6:35 PM

## 2021-05-01 NOTE — Progress Notes (Signed)
Pharmacy Antibiotic Note  [name] is a 50 y.o. female admitted on 05/01/2021 with  aspiration pneumonia .  Pharmacy has been consulted for Unasyn dosing.  Patient found unresponsive and had episode of emesis during CPR. Brought to hospital for drug overdose. WBC 12.1, afebrile. SCr 1.1.   Plan: Start Unasyn 3 g IV q8h Follow labs, cultures, and clinical improvement as appropriate.     Temp (24hrs), Avg:97.1 F (36.2 C), Min:97.1 F (36.2 C), Max:97.1 F (36.2 C)  Recent Labs  Lab 05/01/21 1112  WBC 12.1*    CrCl cannot be calculated (Patient's most recent lab result is older than the maximum 21 days allowed.).    Allergies  Allergen Reactions   Codeine Nausea Only    Antimicrobials this admission: Unasyn 10/3>>  Microbiology results: No cultures yet.  Thank you for allowing pharmacy to be a part of this patient's care.  Georgeann Oppenheim 05/01/2021 3:50 PM

## 2021-05-02 ENCOUNTER — Inpatient Hospital Stay (HOSPITAL_COMMUNITY): Payer: Self-pay

## 2021-05-02 DIAGNOSIS — I469 Cardiac arrest, cause unspecified: Secondary | ICD-10-CM

## 2021-05-02 LAB — ECHOCARDIOGRAM LIMITED
Area-P 1/2: 3.49 cm2
Height: 66 in
S' Lateral: 2.7 cm

## 2021-05-02 LAB — GLUCOSE, CAPILLARY
Glucose-Capillary: 172 mg/dL — ABNORMAL HIGH (ref 70–99)
Glucose-Capillary: 146 mg/dL — ABNORMAL HIGH (ref 70–99)
Glucose-Capillary: 119 mg/dL — ABNORMAL HIGH (ref 70–99)

## 2021-05-02 MED ORDER — ESCITALOPRAM OXALATE 20 MG PO TABS
40.0000 mg | ORAL_TABLET | Freq: Every day | ORAL | Status: DC
  Administered 2021-05-03 – 2021-05-04 (×2): 40 mg via ORAL
  Filled 2021-05-02 (×3): qty 2

## 2021-05-02 MED ORDER — NICOTINE 21 MG/24HR TD PT24
21.0000 mg | MEDICATED_PATCH | Freq: Every day | TRANSDERMAL | Status: DC
  Administered 2021-05-02 – 2021-05-04 (×3): 21 mg via TRANSDERMAL
  Filled 2021-05-02 (×3): qty 1

## 2021-05-02 MED ORDER — ACETAMINOPHEN 325 MG PO TABS
650.0000 mg | ORAL_TABLET | Freq: Four times a day (QID) | ORAL | Status: DC | PRN
  Administered 2021-05-02 – 2021-05-03 (×4): 650 mg via ORAL
  Filled 2021-05-02 (×4): qty 2

## 2021-05-02 MED ORDER — CHLORHEXIDINE GLUCONATE CLOTH 2 % EX PADS
6.0000 | MEDICATED_PAD | Freq: Every day | CUTANEOUS | Status: DC
  Administered 2021-05-02: 6 via TOPICAL

## 2021-05-02 MED ORDER — CLONAZEPAM 0.5 MG PO TABS
1.0000 mg | ORAL_TABLET | Freq: Two times a day (BID) | ORAL | Status: DC | PRN
  Administered 2021-05-02: 1 mg via ORAL
  Filled 2021-05-02: qty 2

## 2021-05-02 NOTE — Consult Note (Addendum)
[facility] Face-to-Face Psychiatry Consult   Reason for Consult:  Concern for polypharmacy, polysubstance abuse, Overdose Referring Physician:  Rutherford Guys, MD Patient Identification: [name] MRN:  [id number] Principal Diagnosis: <principal problem not specified> Diagnosis:  Active Problems:   Cardiac arrest [facility])   Accidental overdose   Altered mental status   Polysubstance abuse (HCC)   Encephalopathy acute   Total Time spent with patient: 30 minutes   Assessment  [name] is a 50 y.o. 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   She does not meet criteria for inpatient psychiatric hospitalization based on presentation and chart review.  There is some concern that patient has not been forthcoming with her outpatient providers regarding substance use and this could affect patient's overall psychiatric dx. Outpatient psychotropic medications include Lexapro 40mg , Risperdal 1mg  AM and 2mg  QHS, Strattera 40mg , klonopin 1mg  BID, and per EMR Trazodone 200mg  QHS. Historically she has had a positive response to her Klononpin, Strattera, Risperdal, and Lexapro, per her own report. She endorses compliance with medications prior to admission as evidenced by self report and chart review.  On initial examination, patient appears irritable but participates with assessment with frequent redirection. Patient denied illicit substance use to our team and endorses that she does not believe she needs a consult to psychiatry beyond ensuing her home medications are correct.  Of note, endorsed substance use to  multiple previous teams and had (+) UDS (+cocaine, BZD, amphetamines); additionally of note our lab assay may not detect fentanyl (likely exposure leading to hospitalization) and usually does not detect klonopin as a BZD (possibly additional illicit use).   We plan to continue home medications patient endorses compliance with. Education was provided to patient and her 2 children in the room regarding illicit substance use and the possibility of fentanyl being in substances without the knowledge of the person using or selling. There is significant risk for polypharmacy in this patient prescribed BZD and opioids regularly. We will reach out to Karel Jarvis (prescribes klonopin) regarding this hospital stay after an OD.   Polysusbstance abuse (cocaine and methamphetamines)  Hx dx of Schizoaffective disorder (r/o Substance induced psychosis, Substance induced mood disorder) - Restart home risperdal 1mg  daily and 2mg  QHS - Start home Lexapro 40mg  daily - Start home Klonopin 1mg  BID PRN (do not schedule)  - risk of withdrawal outweighs risk of overdose in controlled setting.  - Recommend a home prescription of Narcan  - hold home straterra  - Recommend UA   ## Safety and Observation Level:  - Based on my clinical evaluation, I estimate the patient to be at low risk of self harm in the current setting - At this time, we recommend a standard level of observation. This decision is based on my review of the chart including patient's history and current presentation, interview of the patient, mental status examination, and consideration of suicide risk including evaluating suicidal ideation, plan, intent, suicidal or self-harm behaviors, risk factors, and protective factors. This judgment is based on our ability to directly address suicide risk, implement suicide prevention strategies and develop a safety plan while the patient is in the clinical setting. Please contact our team  if there is a concern that  risk level has changed.    Dispo: Per priamry team We will continue to follow patient.   Reviewed labs: UDS: + for benzos, cocaine, and Amphetamine. EKG- QTC 479 CBC WNL w/ exception for elev WBC 12.7 and mild thrombocytopenia 140. CMP- WNL w/ exception for Cr 1.14, Ca 8.8 and Glu 123. EtOH- neg, HIV- neg UA- pending   Imaging:  CT Head WO Contrast IMPRESSION: 1. No acute intracranial pathology. 2. Layering fluid in the right maxillary and sphenoid sinuses which can be seen with acute sinusitis in the correct clinical setting.  Subjective:   [name] is a 50 y.o. 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 Per EMR patient was found by her daughter [name], on their front porch. Patient required 3mg  Narcan for resuscitation.   Patient lives with her husband, father, one of her daughter, and a grandchild.   Patient's 2/3 daughters are in the room; and [name].  [name] is a 50 y.o. female patient admitted with overdose. On assessment today patient reports she is feeling "a little sluggish" and adamantly denies hx of illicit substance use (asked multiple times during assessment) and reports that she does not recall every telling any prior providers that she was using illicit substances. Patient reports that she is not taking any medications other then Lexapro, Risperdal, and Strattera. Patient reports she is not sure why her family would need Narcan because "I don't use fentanyl". Patient's daughter's report that they would be happy to take a recommended Narcan. Patient reports that  she does not really wish to participate in assessment, but will answer further questions. Patient reports that she does feel that her psychotropic medications have been beneficial since her most recent medication mgmt appt. Patient denies that she has ever attempted Suicide and does not believe the episode that brought her into the hospital was a suicide attempt. Patient reports that she had some concern that she was possibly on too many medications and that this led to her episode. Patient reports she has been complaint with her Strattera, Lexapro, Risperdal and is taking her klonopin approx 1-2x/day - fill history suggests she is maximizing this medication. Patient denies SI, HI and AVH today.  Past Psychiatric History: Schizoaffective disorder, bipolar type. Currently seen outpatient by [facility]   Past Medical History:  Past Medical History:  Diagnosis Date   Alcohol abuse    Anxiety    Arthritis    Bipolar disorder (HCC)    Depression    GERD (gastroesophageal reflux disease)    H/O hiatal hernia    Hemorrhoids    Small bowel obstruction (HCC) 2012   Wolf-Parkinson-White syndrome    Wolf-Parkinson-White syndrome    per patient    Past Surgical History:  Procedure Laterality Date   ESOPHAGOGASTRODUODENOSCOPY  06/07/2011   Procedure: ESOPHAGOGASTRODUODENOSCOPY (EGD);  Surgeon: 13/02/2011, MD;  [facility];  Service: Endoscopy;  Laterality: N/A;   TUBAL LIGATION     Family History:  Family History  Problem Relation Age of Onset   Breast cancer Maternal Aunt    Colon polyps Maternal Grandmother    Diabetes  Maternal Grandmother    Heart attack Maternal Grandmother    Kidney cancer Maternal Grandmother    Breast cancer Maternal Grandmother    Colon cancer Neg Hx    Anesthesia problems Neg Hx    Hypotension Neg Hx    Malignant hyperthermia Neg Hx    Pseudochol deficiency Neg Hx    Heart disease Father    Heart disease Paternal Grandfather     Heart disease Paternal Grandmother     Social History:  Social History   Substance and Sexual Activity  Alcohol Use No   Comment: previous alcohol abuse but for last 2 weeks 2 drinks. not currently drinking alcohol     Social History   Substance and Sexual Activity  Drug Use No    Social History   Socioeconomic History   Marital status: Single    Spouse name: Not on file   Number of children: 4   Years of education: Not on file   Highest education level: Not on file  Occupational History   Occupation: house wife  Tobacco Use   Smoking status: Every Day    Packs/day: 1.00    Types: Cigarettes   Smokeless tobacco: Never  Substance and Sexual Activity   Alcohol use: No    Comment: previous alcohol abuse but for last 2 weeks 2 drinks. not currently drinking alcohol   Drug use: No   Sexual activity: Not Currently    Birth control/protection: None  Other Topics Concern   Not on file  Social History Narrative   Not on file   Social Determinants of Health   Financial Resource Strain: Medium Risk   Difficulty of Paying Living Expenses: Somewhat hard  Food Insecurity: No Food Insecurity   Worried About Running Out of Food in the Last Year: Never true   Ran Out of Food in the Last Year: Never true  Transportation Needs: No Transportation Needs   Lack of Transportation (Medical): No   Lack of Transportation (Non-Medical): No  Physical Activity: Inactive   Days of Exercise per Week: 0 days   Minutes of Exercise per Session: 0 min  Stress: Stress Concern Present   Feeling of Stress : Very much  Social Connections: Moderately Isolated   Frequency of Communication with Friends and Family: More than three times a week   Frequency of Social Gatherings with Friends and Family: Once a week   Attends Religious Services: Never   Database administrator or Organizations: No   Attends Banker Meetings: Never   Marital Status: Married   Additional Social History:     Allergies:   Allergies  Allergen Reactions   Acetaminophen Hives, Nausea Only and Rash   Codeine Nausea Only    Labs:  Results for orders placed or performed during the hospital encounter of 05/01/21 (from the past 48 hour(s))  CBC with Differential/Platelet     Status: Abnormal   Collection Time: 05/01/21 11:12 AM  Result Value Ref Range   WBC 12.1 (H) 4.0 - 10.5 K/uL   RBC 4.04 3.87 - 5.11 MIL/uL   Hemoglobin 12.6 12.0 - 15.0 g/dL   HCT 40.9 81.1 - 91.4 %   MCV 95.3 80.0 - 100.0 fL   MCH 31.2 26.0 - 34.0 pg   MCHC 32.7 30.0 - 36.0 g/dL   RDW 78.2 95.6 - 21.3 %   Platelets 192 150 - 400 K/uL   nRBC 0.0 0.0 - 0.2 %   Neutrophils Relative % 81 %  Neutro Abs 9.8 (H) 1.7 - 7.7 K/uL   Lymphocytes Relative 16 %   Lymphs Abs 1.9 0.7 - 4.0 K/uL   Monocytes Relative 2 %   Monocytes Absolute 0.2 0.1 - 1.0 K/uL   Eosinophils Relative 0 %   Eosinophils Absolute 0.1 0.0 - 0.5 K/uL   Basophils Relative 0 %   Basophils Absolute 0.0 0.0 - 0.1 K/uL   Immature Granulocytes 1 %   Abs Immature Granulocytes 0.07 0.00 - 0.07 K/uL    Comment: Performed at [facility], [address], [place], [place] [address]  Resp Panel by RT-PCR (Flu A&B, Covid) Nasopharyngeal Swab     Status: None   Collection Time: 05/01/21 11:12 AM   Specimen: Nasopharyngeal Swab; Nasopharyngeal(NP) swabs in vial transport medium  Result Value Ref Range   SARS Coronavirus 2 by RT PCR NEGATIVE NEGATIVE    Comment: (NOTE) SARS-CoV-2 target nucleic acids are NOT DETECTED.  The SARS-CoV-2 RNA is generally detectable in upper respiratory specimens during the acute phase of infection. The lowest concentration of SARS-CoV-2 viral copies this assay can detect is 138 copies/mL. A negative result does not preclude SARS-Cov-2 infection and should not be used as the sole basis for treatment or other patient management decisions. A negative result may occur with  improper specimen collection/handling, submission of  specimen other than nasopharyngeal swab, presence of viral mutation(s) within the areas targeted by this assay, and inadequate number of viral copies(<138 copies/mL). A negative result must be combined with clinical observations, patient history, and epidemiological information. The expected result is Negative.  Fact Sheet for Patients:  BloggerCourse.com  Fact Sheet for Healthcare Providers:  SeriousBroker.it  This test is no t yet approved or cleared by the [place] FDA and  has been authorized for detection and/or diagnosis of SARS-CoV-2 by FDA under an Emergency Use Authorization (EUA). This EUA will remain  in effect (meaning this test can be used) for the duration of the COVID-19 declaration under Section 564(b)(1) of the Act, 21 U.S.C.section 360bbb-3(b)(1), unless the authorization is terminated  or revoked sooner.       Influenza A by PCR NEGATIVE NEGATIVE   Influenza B by PCR NEGATIVE NEGATIVE    Comment: (NOTE) The Xpert Xpress SARS-CoV-2/FLU/RSV plus assay is intended as an aid in the diagnosis of influenza from Nasopharyngeal swab specimens and should not be used as a sole basis for treatment. Nasal washings and aspirates are unacceptable for Xpert Xpress SARS-CoV-2/FLU/RSV testing.  Fact Sheet for Patients: BloggerCourse.com  Fact Sheet for Healthcare Providers: SeriousBroker.it  This test is not yet approved or cleared by the [place] FDA and has been authorized for detection and/or diagnosis of SARS-CoV-2 by FDA under an Emergency Use Authorization (EUA). This EUA will remain in effect (meaning this test can be used) for the duration of the COVID-19 declaration under Section 564(b)(1) of the Act, 21 U.S.C. section 360bbb-3(b)(1), unless the authorization is terminated or revoked.  Performed at [facility], [address], [place],  [place] [address]   Rapid urine drug screen (hospital performed)     Status: Abnormal   Collection Time: 05/01/21  3:04 PM  Result Value Ref Range   Opiates NONE DETECTED NONE DETECTED   Cocaine POSITIVE (A) NONE DETECTED   Benzodiazepines POSITIVE (A) NONE DETECTED   Amphetamines POSITIVE (A) NONE DETECTED   Tetrahydrocannabinol NONE DETECTED NONE DETECTED   Barbiturates NONE DETECTED NONE DETECTED    Comment: (NOTE) DRUG SCREEN FOR MEDICAL  PURPOSES ONLY.  IF CONFIRMATION IS NEEDED FOR ANY PURPOSE, NOTIFY LAB WITHIN 5 DAYS.  LOWEST DETECTABLE LIMITS FOR URINE DRUG SCREEN Drug Class                     Cutoff (ng/mL) Amphetamine and metabolites    1000 Barbiturate and metabolites    200 Benzodiazepine                 200 Tricyclics and metabolites     300 Opiates and metabolites        300 Cocaine and metabolites        300 THC                            50 Performed at [facility], [address] [place], [place] [address]   Comprehensive metabolic panel     Status: Abnormal   Collection Time: 05/01/21  3:18 PM  Result Value Ref Range   Sodium 138 135 - 145 mmol/L   Potassium 4.0 3.5 - 5.1 mmol/L   Chloride 104 98 - 111 mmol/L   CO2 26 22 - 32 mmol/L   Glucose, Bld 123 (H) 70 - 99 mg/dL    Comment: Glucose reference range applies only to samples taken after fasting for at least 8 hours.   BUN 14 6 - 20 mg/dL   Creatinine, Ser 4.31 (H) 0.44 - 1.00 mg/dL   Calcium 8.8 (L) 8.9 - 10.3 mg/dL   Total Protein 6.9 6.5 - 8.1 g/dL   Albumin 3.6 3.5 - 5.0 g/dL   AST 16 15 - 41 U/L   ALT 12 0 - 44 U/L   Alkaline Phosphatase 75 38 - 126 U/L   Total Bilirubin 0.4 0.3 - 1.2 mg/dL   GFR, Estimated 58 (L) >60 mL/min    Comment: (NOTE) Calculated using the CKD-EPI Creatinine Equation (2021)    Anion gap 8 5 - 15    Comment: Performed at [facility], [address], [place], [place] [address]  Ethanol     Status: None   Collection Time: 05/01/21  4:12 PM  Result Value Ref  Range   Alcohol, Ethyl (B) <10 <10 mg/dL    Comment: (NOTE) Lowest detectable limit for serum alcohol is 10 mg/dL.  For medical purposes only. Performed at [facility], [address], [place], [place] [address]   CBC     Status: Abnormal   Collection Time: 05/01/21  4:12 PM  Result Value Ref Range   WBC 12.7 (H) 4.0 - 10.5 K/uL   RBC 4.21 3.87 - 5.11 MIL/uL   Hemoglobin 13.1 12.0 - 15.0 g/dL   HCT 50.9 32.6 - 71.2 %   MCV 95.2 80.0 - 100.0 fL   MCH 31.1 26.0 - 34.0 pg   MCHC 32.7 30.0 - 36.0 g/dL   RDW 45.8 09.9 - 83.3 %   Platelets 140 (L) 150 - 400 K/uL   nRBC 0.0 0.0 - 0.2 %    Comment: Performed at [facility], [address], [place], [place] [address]  Brain natriuretic peptide     Status: None   Collection Time: 05/01/21  4:12 PM  Result Value Ref Range   B Natriuretic Peptide 17.7 0.0 - 100.0 pg/mL    Comment: Performed at [facility] Lab, [address], [place], [place] [address]  Acetaminophen level  Status: Abnormal   Collection Time: 05/01/21  4:13 PM  Result Value Ref Range   Acetaminophen (Tylenol), Serum <10 (L) 10 - 30 ug/mL    Comment: (NOTE) Therapeutic concentrations vary significantly. A range of 10-30 ug/mL  may be an effective concentration for many patients. However, some  are best treated at concentrations outside of this range. Acetaminophen concentrations >150 ug/mL at 4 hours after ingestion  and >50 ug/mL at 12 hours after ingestion are often associated with  toxic reactions.  Performed at [facility], [address] [place], [place] [address]   HIV Antibody (routine testing w rflx)     Status: None   Collection Time: 05/01/21  4:13 PM  Result Value Ref Range   HIV Screen 4th Generation wRfx Non Reactive Non Reactive    Comment: Performed at [facility], [address] [place], [place] [address]  Procalcitonin     Status: None   Collection Time: 05/01/21  4:13 PM  Result Value Ref Range    Procalcitonin 0.10 ng/mL    Comment:        Interpretation: PCT (Procalcitonin) <= 0.5 ng/mL: Systemic infection (sepsis) is not likely. Local bacterial infection is possible. (NOTE)       Sepsis PCT Algorithm           Lower Respiratory Tract                                      Infection PCT Algorithm    ----------------------------     ----------------------------         PCT < 0.25 ng/mL                PCT < 0.10 ng/mL          Strongly encourage             Strongly discourage   discontinuation of antibiotics    initiation of antibiotics    ----------------------------     -----------------------------       PCT 0.25 - 0.50 ng/mL            PCT 0.10 - 0.25 ng/mL               OR       >80% decrease in PCT            Discourage initiation of                                            antibiotics      Encourage discontinuation           of antibiotics    ----------------------------     -----------------------------         PCT >= 0.50 ng/mL              PCT 0.26 - 0.50 ng/mL               AND        <80% decrease in PCT             Encourage initiation of  antibiotics       Encourage continuation           of antibiotics    ----------------------------     -----------------------------        PCT >= 0.50 ng/mL                  PCT > 0.50 ng/mL               AND         increase in PCT                  Strongly encourage                                      initiation of antibiotics    Strongly encourage escalation           of antibiotics                                     -----------------------------                                           PCT <= 0.25 ng/mL                                                 OR                                        > 80% decrease in PCT                                      Discontinue / Do not initiate                                             antibiotics  Performed at [facility]  Lab, [address] [place], [place] [address]   Culture, blood (routine x 2)     Status: None (Preliminary result)   Collection Time: 05/01/21  4:13 PM   Specimen: BLOOD LEFT HAND  Result Value Ref Range   Specimen Description BLOOD LEFT HAND    Special Requests      BOTTLES DRAWN AEROBIC AND ANAEROBIC Blood Culture adequate volume   Culture      NO GROWTH < 24 HOURS Performed at [facility], [address] [place], [place] [address]    Report Status PENDING   Culture, blood (routine x 2)     Status: None (Preliminary result)   Collection Time: 05/01/21  4:13 PM   Specimen: BLOOD RIGHT FOREARM  Result Value Ref Range   Specimen Description BLOOD RIGHT FOREARM    Special Requests      BOTTLES DRAWN AEROBIC AND ANAEROBIC Blood Culture adequate volume   Culture  NO GROWTH < 24 HOURS Performed at [facility] Lab, [address] [place], [place] [address]    Report Status PENDING   I-stat chem 8, ED (not at [facility] or [facility])     Status: Abnormal   Collection Time: 05/01/21  4:55 PM  Result Value Ref Range   Sodium 140 135 - 145 mmol/L   Potassium 4.1 3.5 - 5.1 mmol/L   Chloride 103 98 - 111 mmol/L   BUN 16 6 - 20 mg/dL   Creatinine, Ser 6.57 (H) 0.44 - 1.00 mg/dL   Glucose, Bld 846 (H) 70 - 99 mg/dL    Comment: Glucose reference range applies only to samples taken after fasting for at least 8 hours.   Calcium, Ion 1.21 1.15 - 1.40 mmol/L   TCO2 27 22 - 32 mmol/L   Hemoglobin 13.6 12.0 - 15.0 g/dL   HCT 96.2 95.2 - 84.1 %  Glucose, capillary     Status: Abnormal   Collection Time: 05/01/21  5:48 PM  Result Value Ref Range   Glucose-Capillary 172 (H) 70 - 99 mg/dL    Comment: Glucose reference range applies only to samples taken after fasting for at least 8 hours.  Glucose, capillary     Status: Abnormal   Collection Time: 05/01/21  7:17 PM  Result Value Ref Range   Glucose-Capillary 123 (H) 70 - 99 mg/dL    Comment: Glucose reference range applies only to samples  taken after fasting for at least 8 hours.  Glucose, capillary     Status: Abnormal   Collection Time: 05/02/21  1:12 AM  Result Value Ref Range   Glucose-Capillary 146 (H) 70 - 99 mg/dL    Comment: Glucose reference range applies only to samples taken after fasting for at least 8 hours.  Glucose, capillary     Status: Abnormal   Collection Time: 05/02/21  3:24 AM  Result Value Ref Range   Glucose-Capillary 119 (H) 70 - 99 mg/dL    Comment: Glucose reference range applies only to samples taken after fasting for at least 8 hours.    Current Facility-Administered Medications  Medication Dose Route Frequency Provider Last Rate Last Admin   acetaminophen (TYLENOL) tablet 650 mg  650 mg Oral Q6H PRN Larinda Buttery, MD   650 mg at 05/02/21 1220   Ampicillin-Sulbactam (UNASYN) 3 g in sodium chloride 0.9 % 100 mL IVPB  3 g Intravenous Q8H Georgeann Oppenheim, RPH 200 mL/hr at 05/02/21 0800 Infusion Verify at 05/02/21 0800   chlorhexidine (PERIDEX) 0.12 % solution 15 mL  15 mL Mouth Rinse BID Cheri Fowler, MD   15 mL at 05/02/21 0920   Chlorhexidine Gluconate Cloth 2 % PADS 6 each  6 each Topical Daily Cheri Fowler, MD   6 each at 05/02/21 0400   clonazePAM (KLONOPIN) tablet 1 mg  1 mg Oral BID PRN Bobbye Morton, MD       docusate sodium (COLACE) capsule 100 mg  100 mg Oral BID PRN Lanier Clam, NP       enoxaparin (LOVENOX) injection 40 mg  40 mg Subcutaneous Q24H Bowser, Kaylyn Layer, NP   40 mg at 05/01/21 2006   escitalopram (LEXAPRO) tablet 40 mg  40 mg Oral Daily Eliseo Gum B, MD       folic acid injection 1 mg  1 mg Intravenous Daily Desai, Rahul P, PA-C   1 mg at 05/01/21 2204   MEDLINE mouth rinse  15 mL Mouth Rinse  [id number] Cheri Fowler, MD   15 mL at 05/02/21 1300   naloxone (NARCAN) injection 0.4 mg  0.4 mg Intravenous PRN Cheri Fowler, MD       ondansetron (ZOFRAN) injection 4 mg  4 mg Intravenous Q6H PRN Bowser, Kaylyn Layer, NP       polyethylene glycol (MIRALAX / GLYCOLAX) packet 17 g   17 g Oral Daily PRN Bowser, Kaylyn Layer, NP       thiamine (B-1) injection 100 mg  100 mg Intravenous Daily Desai, Rahul P, PA-C   100 mg at 05/02/21 0920    Musculoskeletal: Strength & Muscle Tone:  unable to assess Gait & Station:  patient remains in bed on assessment Patient leans: N/A            Psychiatric Specialty Exam:  Presentation  General Appearance: Appropriate for Environment  Eye Contact:Minimal  Speech:Slow  Speech Volume:-- (Decreased when fallin asleep but increased when children wake her up)  Handedness: No data recorded  Mood and Affect  Mood:Irritable  Affect:Congruent   Thought Process  Thought Processes:Coherent  Descriptions of Associations:Circumstantial  Orientation:Full (Time, Place and Person)  Thought Content:Logical  History of Schizophrenia/Schizoaffective disorder:No data recorded Duration of Psychotic Symptoms:No data recorded Hallucinations:Hallucinations: None  Ideas of Reference:None  Suicidal Thoughts:Suicidal Thoughts: No  Homicidal Thoughts:Homicidal Thoughts: No   Sensorium  Memory:Immediate Fair; Recent Fair; Remote Fair  Judgment:Impaired  Insight:Lacking   Executive Functions  Concentration:Fair  Attention Span:Poor  Recall: No data recorded Fund of Knowledge:Fair  Language:Fair   Psychomotor Activity  Psychomotor Activity:Psychomotor Activity: Increased; Restlessness   Assets  Assets:Resilience; Social Support; Housing   Sleep  Sleep:Sleep: Fair   Physical Exam: Physical Exam Pulmonary:     Effort: Pulmonary effort is normal.  Neurological:     Mental Status: She is alert and oriented to person, place, and time.   Review of Systems  Psychiatric/Behavioral:  Negative for hallucinations and suicidal ideas.   Blood pressure 95/65, pulse 88, temperature 98.2 F (36.8 C), temperature source Oral, resp. rate 17, height 5\' 6"  (1.676 m), SpO2 98 %. Body mass index is 32.77  kg/m.  PGY-2 , MD 05/02/2021 1:12 PM

## 2021-05-02 NOTE — Progress Notes (Signed)
Upon arrival to floor pt vital signs updated to mews score of 4/red with temp at 101.3 and hr 121. Tylenol given. Mews guidelines implemented. Charge nurse notified and doc kamryb notified. No new orders. Continue to monitor q1 x 4 and then q4 x4.

## 2021-05-02 NOTE — Progress Notes (Signed)
NAME:  [name], MRN:  [id number], DOB:  10-27-1970, LOS: 1 ADMISSION DATE:  05/01/2021, CONSULTATION DATE:  05/01/21 REFERRING MD:  Estell Harpin CHIEF COMPLAINT:  AMS   History of Present Illness:  [name] is a 49 y.o. female who has a PMH as outlined below including long standing polysubstance abuse.  She was found sitting on her porch in rocking chair unresponsive by family 10/3.  They could not wake her up and when daughter went downstairs, she noticed arms and legs turning blue; therefore, she moved her to the ground and called EMS.  EMS advised to start CPR though its not clear that she ever lost pulses. During CPR, pt vomited with rescue breaths.  EMS arrived and administered 1mg  Narcan with minimal response per daughter.  She was brought to ED where she received an additional 2 doses of 1mg  Narcan each.  With each dose, she had minimal improvement in mental status.  Daughter states her waxing and waning mental status is completely normal behavior for her.  We questioned her multiple times to ensure that this was true and on each occasion, daughter adamant that this is normal.  Per daughter, pt has been active drug user for some 30 years.  Typically cocaine though does have hx of heroine in the past.  Daughter thinks that pt had a dose of cocaine last night that might have been laced with Fentanyl.  Pt tell that shes unsure if it was laced, but does remember taking 1 pill of Roxycodone (prescribed).  Pertinent  Medical History:  has Smoker; Alcohol abuse; LUQ pain; Bleeding internal hemorrhoids; Irritable bowel syndrome with constipation; Panic attacks; Insomnia due to other mental disorder; PTSD (post-traumatic stress disorder); Schizoaffective disorder, bipolar type (HCC); Attention deficit hyperactivity disorder (ADHD); Cardiac arrest (HCC); Accidental overdose; Altered mental status; Polysubstance abuse (HCC); and Encephalopathy acute on their problem list.  Significant Hospital  Events: Including procedures, antibiotic start and stop dates in addition to other pertinent events   10/3 > admit.  Interim History / Subjective:  More awake but still a bit sleepy. Has not required any Narcan since arriving in ICU. Vitals and labs stable.  Objective:  Blood pressure 109/72, pulse 83, temperature 98.3 F (36.8 C), temperature source Oral, resp. rate 19, height 5\' 6"  (1.676 m), SpO2 94 %.        Intake/Output Summary (Last 24 hours) at 05/02/2021 0814 Last data filed at 05/02/2021 0530 Gross per 24 hour  Intake 99.87 ml  Output 700 ml  Net -600.13 ml   There were no vitals filed for this visit.  Examination: General: Adult female, in NAD. Neuro: Sleepy but arouses to voice and able to answer basic questions appropriately.  MAE's. HEENT: [place]/AT. Sclerae anicteric.  Cardiovascular: RRR, no M/R/G.  Lungs: Respirations even and unlabored.  Coarse bilaterally. Abdomen: BS x 4, soft, NT/ND.  Musculoskeletal: No gross deformities, no edema.  Skin: Intact, warm, no rashes.  Labs/imaging personally reviewed:  CT head 10/3 > no intracranial process, possible acute sinusitis.  Resolved Hospital Problem list:    Assessment & Plan:   Possible drug overdose - unsure whether cocaine was laced or not per pt / daughter. AMS - 2/2 above.  Head CT neg, UDS pos for amphetamines (previously prescribed but per last psych note 9/13, no stimulants would be prescribed since pt was on controlled substance), benzos (prescribed), cocaine.  Per daughter, this is pt's typical behavior when she uses drugs. Hx polysubstance abuse, EtOH abuse, anxiety, bipolar  disorder, depression, PTSD, panic attacks, insomnia. Concern for polypharmacy - home med list has multiple duplicates of similar meds / same class.  Unsure which meds she is actually taking versus old list, etc.  Her last med list from psych note 9/13 indicates: Risperidone 1mg  QAM and 2mg  QHS, Escitalopram 40mg  QD, Trazodone 300mg   QHS, Clonazepam 1mg  BID. - Transfer out of ICU to floor bed. - Consult psychiatry.  Needs further med rec - stressed importance of minimal med intake required in order to avoid polypharmacy and recurrent admissions and accidental or intentional overdose with risk of death.  Her overdose risk score per [place] PMP database is 450 which indicates an odds ratio of 25 for death from unintentional overdose. - Polysubstance abuse counseling provided.  Possible aspiration PNA. - Continue Unasyn.  Change to Augmentin once more awake.  Would plan 7 days total abx therapy.  - Follow cultures.  Transfer out of ICU. Needs psychiatry evaluation and med rec.  Will ask [facility] to assume care in AM 10/5 with PCCM off.   Best practice (evaluated daily):  Diet/type: NPO DVT prophylaxis: LMWH GI prophylaxis: N/A Lines: N/A Foley:  N/A Code Status:  full code Last date of multidisciplinary goals of care discussion: None.   , PA - [facility] For pager details, please see AMION or use Epic chat  After 1900, please call [facility] for cross coverage needs 05/02/2021, 8:14 AM

## 2021-05-02 NOTE — Progress Notes (Signed)
Pt transferred to 6N room 15.

## 2021-05-02 NOTE — Progress Notes (Signed)
eLink Physician-Brief Progress Note Patient Name: [name] DOB: Sep 07, 1970 MRN: [id number]   Date of Service  05/02/2021  HPI/Events of Note  Notified of request for tylenol.  Pt is complaining of headache.   50/F with polysubstance drug intoxication, and aspiration pneumonia.  On camera assessment, pt is awake and alert and following commands.  eICU Interventions  Tylenol ordered prn.     Intervention Category Minor Interventions: Other:  Larinda Buttery 05/02/2021, 4:42 AM

## 2021-05-02 NOTE — Progress Notes (Signed)
  Echocardiogram 2D Echocardiogram has been performed.  Leta Jungling M 05/02/2021, 8:43 AM

## 2021-05-03 DIAGNOSIS — J690 Pneumonitis due to inhalation of food and vomit: Secondary | ICD-10-CM | POA: Diagnosis present

## 2021-05-03 DIAGNOSIS — F1910 Other psychoactive substance abuse, uncomplicated: Secondary | ICD-10-CM

## 2021-05-03 DIAGNOSIS — F1920 Other psychoactive substance dependence, uncomplicated: Secondary | ICD-10-CM | POA: Diagnosis present

## 2021-05-03 DIAGNOSIS — I469 Cardiac arrest, cause unspecified: Secondary | ICD-10-CM

## 2021-05-03 DIAGNOSIS — T50901A Poisoning by unspecified drugs, medicaments and biological substances, accidental (unintentional), initial encounter: Secondary | ICD-10-CM

## 2021-05-03 DIAGNOSIS — G9341 Metabolic encephalopathy: Secondary | ICD-10-CM | POA: Diagnosis present

## 2021-05-03 LAB — CBC
HCT: 36.3 % (ref 36.0–46.0)
Hemoglobin: 12.2 g/dL (ref 12.0–15.0)
MCH: 31.4 pg (ref 26.0–34.0)
MCHC: 33.6 g/dL (ref 30.0–36.0)
MCV: 93.3 fL (ref 80.0–100.0)
Platelets: 131 10*3/uL — ABNORMAL LOW (ref 150–400)
RBC: 3.89 MIL/uL (ref 3.87–5.11)
RDW: 13.4 % (ref 11.5–15.5)
WBC: 10.9 10*3/uL — ABNORMAL HIGH (ref 4.0–10.5)
nRBC: 0 % (ref 0.0–0.2)

## 2021-05-03 LAB — PHOSPHORUS: Phosphorus: 2 mg/dL — ABNORMAL LOW (ref 2.5–4.6)

## 2021-05-03 LAB — BASIC METABOLIC PANEL
Anion gap: 5 (ref 5–15)
BUN: 15 mg/dL (ref 6–20)
CO2: 31 mmol/L (ref 22–32)
Calcium: 8.4 mg/dL — ABNORMAL LOW (ref 8.9–10.3)
Chloride: 98 mmol/L (ref 98–111)
Creatinine, Ser: 1.21 mg/dL — ABNORMAL HIGH (ref 0.44–1.00)
GFR, Estimated: 54 mL/min — ABNORMAL LOW (ref >60)
Glucose, Bld: 102 mg/dL — ABNORMAL HIGH (ref 70–99)
Potassium: 4 mmol/L (ref 3.5–5.1)
Sodium: 134 mmol/L — ABNORMAL LOW (ref 135–145)

## 2021-05-03 LAB — MAGNESIUM: Magnesium: 1.4 mg/dL — ABNORMAL LOW (ref 1.7–2.4)

## 2021-05-03 MED ORDER — MAGNESIUM SULFATE 2 GM/50ML IV SOLN
2.0000 g | Freq: Once | INTRAVENOUS | Status: AC
  Administered 2021-05-03: 2 g via INTRAVENOUS
  Filled 2021-05-03: qty 50

## 2021-05-03 MED ORDER — THIAMINE HCL 100 MG PO TABS
100.0000 mg | ORAL_TABLET | Freq: Every day | ORAL | Status: DC
  Administered 2021-05-04: 100 mg via ORAL
  Filled 2021-05-03: qty 1

## 2021-05-03 MED ORDER — FOLIC ACID 1 MG PO TABS
1.0000 mg | ORAL_TABLET | Freq: Every day | ORAL | Status: DC
  Administered 2021-05-04: 1 mg via ORAL
  Filled 2021-05-03: qty 1

## 2021-05-03 NOTE — Assessment & Plan Note (Signed)
Cocaine and vitamin positive in the UDS.  Counseled to quit.

## 2021-05-03 NOTE — Assessment & Plan Note (Signed)
Started on IV Unasyn.  Still has low-grade fever.  Continue to monitor overnight.

## 2021-05-03 NOTE — Assessment & Plan Note (Signed)
Nicotine patch.  Recommend to quit.

## 2021-05-03 NOTE — Assessment & Plan Note (Signed)
Management per psychiatry outpatient.

## 2021-05-03 NOTE — Consult Note (Signed)
[facility] Face-to-Face Psychiatry Consult   Reason for Consult:  Concern for polypharmacy, polysubstance abuse, Overdose Referring Physician:  Rutherford Guys, MD Patient Identification: [name] MRN:  [id number] Principal Diagnosis: <principal problem not specified> Diagnosis:  Active Problems:   Cardiac arrest [facility])   Accidental overdose   Altered mental status   Polysubstance abuse (HCC)   Encephalopathy acute   Total Time spent with patient: 15 min   Assessment  [name] is a 50 y.o. 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   She does not meet criteria for inpatient psychiatric hospitalization based on presentation and chart review.  There is some concern that patient has not been forthcoming with her outpatient providers regarding substance use and this could affect patient's overall psychiatric dx. Outpatient psychotropic medications include Lexapro , Risperdal  AM and  QHS, Strattera , klonopin  BID, and per EMR Trazodone  QHS. Historically she has had a positive response to her Klononpin, Strattera, Risperdal, and Lexapro, per her own report. She endorses compliance with medications prior to admission as evidenced by self report and chart review.  On initial examination, patient appears irritable but participates with assessment with frequent redirection. Patient denied illicit substance use to our team and endorses that she does not believe she needs a consult to psychiatry beyond ensuing her home medications are correct.  Of note, endorsed substance use to multiple  previous teams and had (+) UDS (+cocaine, BZD, amphetamines); additionally of note our lab assay may not detect fentanyl (likely exposure leading to hospitalization) and usually does not detect klonopin as a BZD (possibly additional illicit use).   We plan to continue home medications patient endorses compliance with. Education was provided to patient and her 2 children in the room regarding illicit substance use and the possibility of fentanyl being in substances without the knowledge of the person using or selling. There is significant risk for polypharmacy in this patient prescribed BZD and opioids regularly. We will reach out to [name] (prescribes klonopin) regarding this hospital stay after an unintentional OD.   05/03/21: re-evaluated patient without daughters there who continues to adamantly deny buying illicit substances; requested psychiatry team leave and sign off when attempted to discuss this further. Continues to deny SI, HI, AH/VH/etc  Polysusbstance abuse (cocaine and methamphetamines)  Hx dx of Schizoaffective disorder (r/o Substance induced psychosis, Substance induced mood disorder) - continue  home risperdal  daily and  QHS - Start home Lexapro  daily - Start home Klonopin  BID PRN (do not schedule)  - risk of withdrawal outweighs risk of overdose in controlled setting.   - has only requested once in last ~36 hours  - Recommend a home prescription of Narcan  - hold home straterra  - Recommend UA   ## Safety and Observation Level:  - Based on my clinical evaluation, I estimate the patient to be at low risk of self harm in the current setting - At this time, we recommend a standard level of observation. This decision is based on my review of the chart including patient's history and current presentation, interview of the patient, mental status examination, and consideration of suicide risk including  evaluating suicidal ideation, plan, intent, suicidal or  self-harm behaviors, risk factors, and protective factors. This judgment is based on our ability to directly address suicide risk, implement suicide prevention strategies and develop a safety plan while the patient is in the clinical setting. Please contact our team if there is a concern that risk level has changed.    Dispo: Per priamry team We will sign off at this time  Reviewed labs: UDS: + for benzos, cocaine, and Amphetamine. EKG- QTC 479 CBC WNL w/ exception for elev WBC 12.7 and mild thrombocytopenia 140. CMP- WNL w/ exception for Cr 1.14, Ca 8.8 and Glu 123. EtOH- neg, HIV- neg UA- pending   Imaging:  CT Head WO Contrast IMPRESSION: 1. No acute intracranial pathology. 2. Layering fluid in the right maxillary and sphenoid sinuses which can be seen with acute sinusitis in the correct clinical setting.  Subjective:   [name] is a 50 y.o. 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 Per EMR patient was found by her daughter [name], on their front porch. Patient required 3mg  Narcan for resuscitation.   Patient lives with her husband, father, one of her daughter, and a grandchild.   Patient's interviewed alone [name] is a 50 y.o. female patient admitted with overdose.Has gotten 1 dose of klonopin since seen yesterday.   Saw pt in late afternoon with goal of providing further resources; attempted to discuss substance use in private. Pt adamantly denies memory of events leading up to hospital presentation and also denies any intentional illicit substance use. Asks psychiatry to leave  room and turn off lights (pt had been napping). She denied SI, HI, AH/VH and again asked psychiatry to leave room.   I did let her know we would inform outpt prescriber re: presumed unintentional OD.   Past Psychiatric History: Schizoaffective disorder, bipolar type. Currently seen outpatient by [facility].   Past Medical History:  Past Medical History:  Diagnosis Date   Alcohol abuse    Anxiety    Arthritis    Bipolar disorder (HCC)    Depression    GERD (gastroesophageal reflux disease)    H/O hiatal hernia    Hemorrhoids    Small bowel obstruction (HCC) 2012   Wolf-Parkinson-White syndrome    Wolf-Parkinson-White syndrome    per patient    Past Surgical History:  Procedure Laterality Date   ESOPHAGOGASTRODUODENOSCOPY  06/07/2011   Procedure: ESOPHAGOGASTRODUODENOSCOPY (EGD);  Surgeon: 13/02/2011, MD;  [facility];  Service: Endoscopy;  Laterality: N/A;   TUBAL LIGATION     Family History:  Family History  Problem Relation Age of Onset   Breast cancer Maternal Aunt    Colon polyps Maternal Grandmother    Diabetes Maternal Grandmother    Heart attack Maternal Grandmother    Kidney cancer Maternal Grandmother    Breast cancer Maternal Grandmother    Colon cancer Neg Hx    Anesthesia problems Neg Hx    Hypotension Neg Hx    Malignant hyperthermia Neg Hx    Pseudochol deficiency Neg Hx    Heart disease Father    Heart disease Paternal Grandfather    Heart disease Paternal  Grandmother     Social History:  Social History   Substance and Sexual Activity  Alcohol Use No   Comment: previous alcohol abuse but for last 2 weeks 2 drinks. not currently drinking alcohol     Social History   Substance and Sexual Activity  Drug Use No    Social History   Socioeconomic History   Marital status: Single    Spouse name: Not on file   Number of children: 4   Years of education: Not on file   Highest education level: Not on file   Occupational History   Occupation: house wife  Tobacco Use   Smoking status: Every Day    Packs/day: 1.00    Types: Cigarettes   Smokeless tobacco: Never  Substance and Sexual Activity   Alcohol use: No    Comment: previous alcohol abuse but for last 2 weeks 2 drinks. not currently drinking alcohol   Drug use: No   Sexual activity: Not Currently    Birth control/protection: None  Other Topics Concern   Not on file  Social History Narrative   Not on file   Social Determinants of Health   Financial Resource Strain: Medium Risk   Difficulty of Paying Living Expenses: Somewhat hard  Food Insecurity: No Food Insecurity   Worried About Running Out of Food in the Last Year: Never true   Ran Out of Food in the Last Year: Never true  Transportation Needs: No Transportation Needs   Lack of Transportation (Medical): No   Lack of Transportation (Non-Medical): No  Physical Activity: Inactive   Days of Exercise per Week: 0 days   Minutes of Exercise per Session: 0 min  Stress: Stress Concern Present   Feeling of Stress : Very much  Social Connections: Moderately Isolated   Frequency of Communication with Friends and Family: More than three times a week   Frequency of Social Gatherings with Friends and Family: Once a week   Attends Religious Services: Never   Database administrator or Organizations: No   Attends Banker Meetings: Never   Marital Status: Married   Additional Social History:    Allergies:   Allergies  Allergen Reactions   Acetaminophen Hives, Nausea Only and Rash   Codeine Nausea Only    Labs:  Results for orders placed or performed during the hospital encounter of 05/01/21 (from the past 48 hour(s))  Comprehensive metabolic panel     Status: Abnormal   Collection Time: 05/01/21  3:18 PM  Result Value Ref Range   Sodium 138 135 - 145 mmol/L   Potassium 4.0 3.5 - 5.1 mmol/L   Chloride 104 98 - 111 mmol/L   CO2 26 22 - 32 mmol/L   Glucose, Bld  123 (H) 70 - 99 mg/dL    Comment: Glucose reference range applies only to samples taken after fasting for at least 8 hours.   BUN 14 6 - 20 mg/dL   Creatinine, Ser 3.73 (H) 0.44 - 1.00 mg/dL   Calcium 8.8 (L) 8.9 - 10.3 mg/dL   Total Protein 6.9 6.5 - 8.1 g/dL   Albumin 3.6 3.5 - 5.0 g/dL   AST 16 15 - 41 U/L   ALT 12 0 - 44 U/L   Alkaline Phosphatase 75 38 - 126 U/L   Total Bilirubin 0.4 0.3 - 1.2 mg/dL   GFR, Estimated 58 (L) >60 mL/min    Comment: (NOTE) Calculated using the CKD-EPI Creatinine Equation (2021)    Anion  gap 8 5 - 15    Comment: Performed at [facility], [address], [place], [place] [address]  Ethanol     Status: None   Collection Time: 05/01/21  4:12 PM  Result Value Ref Range   Alcohol, Ethyl (B) <10 <10 mg/dL    Comment: (NOTE) Lowest detectable limit for serum alcohol is 10 mg/dL.  For medical purposes only. Performed at [facility], [address] [place], [place] [address]   CBC     Status: Abnormal   Collection Time: 05/01/21  4:12 PM  Result Value Ref Range   WBC 12.7 (H) 4.0 - 10.5 K/uL   RBC 4.21 3.87 - 5.11 MIL/uL   Hemoglobin 13.1 12.0 - 15.0 g/dL   HCT 09.8 11.9 - 14.7 %   MCV 95.2 80.0 - 100.0 fL   MCH 31.1 26.0 - 34.0 pg   MCHC 32.7 30.0 - 36.0 g/dL   RDW 82.9 56.2 - 13.0 %   Platelets 140 (L) 150 - 400 K/uL   nRBC 0.0 0.0 - 0.2 %    Comment: Performed at [facility], [address], [place], [place] [address]  Brain natriuretic peptide     Status: None   Collection Time: 05/01/21  4:12 PM  Result Value Ref Range   B Natriuretic Peptide 17.7 0.0 - 100.0 pg/mL    Comment: Performed at [facility], [address] [place], [place] [address]  Acetaminophen level     Status: Abnormal   Collection Time: 05/01/21  4:13 PM  Result Value Ref Range   Acetaminophen (Tylenol), Serum <10 (L) 10 - 30 ug/mL    Comment: (NOTE) Therapeutic concentrations vary significantly. A range of 10-30 ug/mL  may be an  effective concentration for many patients. However, some  are best treated at concentrations outside of this range. Acetaminophen concentrations >150 ug/mL at 4 hours after ingestion  and >50 ug/mL at 12 hours after ingestion are often associated with  toxic reactions.  Performed at [facility], [address] [place], [place] [address]   HIV Antibody (routine testing w rflx)     Status: None   Collection Time: 05/01/21  4:13 PM  Result Value Ref Range   HIV Screen 4th Generation wRfx Non Reactive Non Reactive    Comment: Performed at [facility], [address] [place], [place] [address]  Procalcitonin     Status: None   Collection Time: 05/01/21  4:13 PM  Result Value Ref Range   Procalcitonin 0.10 ng/mL    Comment:        Interpretation: PCT (Procalcitonin) <= 0.5 ng/mL: Systemic infection (sepsis) is not likely. Local bacterial infection is possible. (NOTE)       Sepsis PCT Algorithm           Lower Respiratory Tract                                      Infection PCT Algorithm    ----------------------------     ----------------------------         PCT < 0.25 ng/mL                PCT < 0.10 ng/mL          Strongly encourage             Strongly discourage  discontinuation of antibiotics    initiation of antibiotics    ----------------------------     -----------------------------       PCT 0.25 - 0.50 ng/mL            PCT 0.10 - 0.25 ng/mL               OR       >80% decrease in PCT            Discourage initiation of                                            antibiotics      Encourage discontinuation           of antibiotics    ----------------------------     -----------------------------         PCT >= 0.50 ng/mL              PCT 0.26 - 0.50 ng/mL               AND        <80% decrease in PCT             Encourage initiation of                                             antibiotics       Encourage continuation           of antibiotics     ----------------------------     -----------------------------        PCT >= 0.50 ng/mL                  PCT > 0.50 ng/mL               AND         increase in PCT                  Strongly encourage                                      initiation of antibiotics    Strongly encourage escalation           of antibiotics                                     -----------------------------                                           PCT <= 0.25 ng/mL                                                 OR                                        >  80% decrease in PCT                                      Discontinue / Do not initiate                                             antibiotics  Performed at [facility], [address] [place], [place] [address]   Culture, blood (routine x 2)     Status: None (Preliminary result)   Collection Time: 05/01/21  4:13 PM   Specimen: BLOOD LEFT HAND  Result Value Ref Range   Specimen Description BLOOD LEFT HAND    Special Requests      BOTTLES DRAWN AEROBIC AND ANAEROBIC Blood Culture adequate volume   Culture      NO GROWTH 2 DAYS Performed at [facility], [address] [place], [place] [address]    Report Status PENDING   Culture, blood (routine x 2)     Status: None (Preliminary result)   Collection Time: 05/01/21  4:13 PM   Specimen: BLOOD RIGHT FOREARM  Result Value Ref Range   Specimen Description BLOOD RIGHT FOREARM    Special Requests      BOTTLES DRAWN AEROBIC AND ANAEROBIC Blood Culture adequate volume   Culture      NO GROWTH 2 DAYS Performed at [facility], [address] [place], [place] [address]    Report Status PENDING   I-stat chem 8, ED (not at [facility] or [facility])     Status: Abnormal   Collection Time: 05/01/21  4:55 PM  Result Value Ref Range   Sodium 140 135 - 145 mmol/L   Potassium 4.1 3.5 - 5.1 mmol/L   Chloride 103 98 - 111 mmol/L   BUN 16 6 - 20 mg/dL   Creatinine, Ser 1.51 (H) 0.44 - 1.00 mg/dL    Glucose, Bld 761 (H) 70 - 99 mg/dL    Comment: Glucose reference range applies only to samples taken after fasting for at least 8 hours.   Calcium, Ion 1.21 1.15 - 1.40 mmol/L   TCO2 27 22 - 32 mmol/L   Hemoglobin 13.6 12.0 - 15.0 g/dL   HCT 60.7 37.1 - 06.2 %  Glucose, capillary     Status: Abnormal   Collection Time: 05/01/21  5:48 PM  Result Value Ref Range   Glucose-Capillary 172 (H) 70 - 99 mg/dL    Comment: Glucose reference range applies only to samples taken after fasting for at least 8 hours.  Glucose, capillary     Status: Abnormal   Collection Time: 05/01/21  7:17 PM  Result Value Ref Range   Glucose-Capillary 123 (H) 70 - 99 mg/dL    Comment: Glucose reference range applies only to samples taken after fasting for at least 8 hours.  Glucose, capillary     Status: Abnormal   Collection Time: 05/02/21  1:12 AM  Result Value Ref Range   Glucose-Capillary 146 (H) 70 - 99 mg/dL    Comment: Glucose reference range applies only to samples taken after fasting for at least 8 hours.  Glucose, capillary     Status: Abnormal   Collection Time: 05/02/21  3:24 AM  Result Value Ref Range   Glucose-Capillary 119 (H) 70 -  99 mg/dL    Comment: Glucose reference range applies only to samples taken after fasting for at least 8 hours.  CBC     Status: Abnormal   Collection Time: 05/03/21  1:47 AM  Result Value Ref Range   WBC 10.9 (H) 4.0 - 10.5 K/uL   RBC 3.89 3.87 - 5.11 MIL/uL   Hemoglobin 12.2 12.0 - 15.0 g/dL   HCT 69.4 85.4 - 62.7 %   MCV 93.3 80.0 - 100.0 fL   MCH 31.4 26.0 - 34.0 pg   MCHC 33.6 30.0 - 36.0 g/dL   RDW 03.5 00.9 - 38.1 %   Platelets 131 (L) 150 - 400 K/uL   nRBC 0.0 0.0 - 0.2 %    Comment: Performed at [facility], [address], [place], [place] [address]  Basic metabolic panel     Status: Abnormal   Collection Time: 05/03/21  1:47 AM  Result Value Ref Range   Sodium 134 (L) 135 - 145 mmol/L   Potassium 4.0 3.5 - 5.1 mmol/L   Chloride 98 98 - 111  mmol/L   CO2 31 22 - 32 mmol/L   Glucose, Bld 102 (H) 70 - 99 mg/dL    Comment: Glucose reference range applies only to samples taken after fasting for at least 8 hours.   BUN 15 6 - 20 mg/dL   Creatinine, Ser 7.16 (H) 0.44 - 1.00 mg/dL   Calcium 8.4 (L) 8.9 - 10.3 mg/dL   GFR, Estimated 54 (L) >60 mL/min    Comment: (NOTE) Calculated using the CKD-EPI Creatinine Equation (2021)    Anion gap 5 5 - 15    Comment: Performed at [facility], [address] [place], [place] [address]  Magnesium     Status: Abnormal   Collection Time: 05/03/21  1:47 AM  Result Value Ref Range   Magnesium 1.4 (L) 1.7 - 2.4 mg/dL    Comment: Performed at [facility], [address], [place], [place] [address]  Phosphorus     Status: Abnormal   Collection Time: 05/03/21  1:47 AM  Result Value Ref Range   Phosphorus 2.0 (L) 2.5 - 4.6 mg/dL    Comment: Performed at [facility], [address] [place], [place] [address]    Current Facility-Administered Medications  Medication Dose Route Frequency Provider Last Rate Last Admin   acetaminophen (TYLENOL) tablet 650 mg  650 mg Oral Q6H PRN Larinda Buttery, MD   650 mg at 05/02/21 2018   Ampicillin-Sulbactam (UNASYN) 3 g in sodium chloride 0.9 % 100 mL IVPB  3 g Intravenous Q8H Georgeann Oppenheim, RPH 200 mL/hr at 05/03/21 1012 3 g at 05/03/21 1012   chlorhexidine (PERIDEX) 0.12 % solution 15 mL  15 mL Mouth Rinse BID Cheri Fowler, MD   15 mL at 05/02/21 2124   Chlorhexidine Gluconate Cloth 2 % PADS 6 each  6 each Topical Daily Cheri Fowler, MD   6 each at 05/02/21 0400   clonazePAM (KLONOPIN) tablet 1 mg  1 mg Oral BID PRN Eliseo Gum B, MD   1 mg at 05/02/21 2124   docusate sodium (COLACE) capsule 100 mg  100 mg Oral BID PRN Lanier Clam, NP       enoxaparin (LOVENOX) injection 40 mg  40 mg Subcutaneous Q24H Bowser, Kaylyn Layer, NP   40 mg at 05/02/21 2124   escitalopram (LEXAPRO) tablet 40 mg  40 mg Oral Daily McQuilla, Gerlean Ren B,  MD   40 mg at  05/03/21 1016   [START ON 05/04/2021] folic acid (FOLVITE) tablet 1 mg  1 mg Oral Daily Norva Pavlov, RPH       magnesium sulfate IVPB 2 g 50 mL  2 g Intravenous Once Rolly Salter, MD       MEDLINE mouth rinse  15 mL Mouth Rinse q12n4p Cheri Fowler, MD   15 mL at 05/02/21 1709   naloxone (NARCAN) injection 0.4 mg  0.4 mg Intravenous PRN Cheri Fowler, MD       nicotine (NICODERM CQ - dosed in mg/24 hours) patch 21 mg  21 mg Transdermal Daily Lorin Glass, MD   21 mg at 05/03/21 1017   ondansetron (ZOFRAN) injection 4 mg  4 mg Intravenous Q6H PRN Bowser, Kaylyn Layer, NP       polyethylene glycol (MIRALAX / GLYCOLAX) packet 17 g  17 g Oral Daily PRN Bowser, Kaylyn Layer, NP       [START ON 05/04/2021] thiamine tablet 100 mg  100 mg Oral Daily Norva Pavlov, [facility]        Musculoskeletal: Strength & Muscle Tone:  unable to assess Gait & Station:  patient remains in bed on assessment Patient leans: N/A            Psychiatric Specialty Exam:  Presentation  General Appearance: Appropriate for Environment  Eye Contact:Fair  Speech:Clear and Coherent; Normal Rate  Speech Volume:Normal  Handedness: No data recorded  Mood and Affect  Mood:-- (did not state)  Affect:-- (frustrated)   Thought Process  Thought Processes:Coherent; Irrevelant  Descriptions of Associations:Intact  Orientation:-- (could not assess, oriented to situation)  Thought Content:-- (devoid of SI/HI)  History of Schizophrenia/Schizoaffective disorder:No data recorded Duration of Psychotic Symptoms:No data recorded Hallucinations:Hallucinations: None  Ideas of Reference:None  Suicidal Thoughts:Suicidal Thoughts: No  Homicidal Thoughts:Homicidal Thoughts: No   Sensorium  Memory:Immediate Fair; Recent Poor; Remote Fair  Judgment:Poor  Insight:Poor   Executive Functions  Concentration:Fair  Attention Span:Fair  Recall: No data recorded Fund of  Knowledge:Fair  Language:Fair   Psychomotor Activity  Psychomotor Activity:Psychomotor Activity: Normal   Assets  Assets:Communication Skills; Social Support   Sleep  Sleep:Sleep: Fair   Physical Exam: Physical Exam Pulmonary:     Effort: Pulmonary effort is normal.  Neurological:     Mental Status: She is alert and oriented to person, place, and time.  Normocephalic/atraumatic  Asked to leave prior to completing ROS however did not voice complaints while I was in the room.  Blood pressure 121/80, pulse 96, temperature (!) 100.7 F (38.2 C), temperature source Oral, resp. rate 16, height  (1.676 m), SpO2 95 %. Body mass index is 32.77 kg/m.   Young Berry Jahyra Sukup 05/03/2021 3:17 PM

## 2021-05-03 NOTE — Assessment & Plan Note (Signed)
Secondary to multiple medications.  Currently close to baseline.

## 2021-05-03 NOTE — Assessment & Plan Note (Signed)
Presents with out of hospital cardiac arrest.  Daughter started CPR. EMS provided Narcan with minimal response.  Started on Narcan drip.  Admitted to the ICU. Did not require intubation.  Currently does not have any respiratory or cardiac symptoms.  No further work-up as it appears that the patient actually had accidental drug overdose which likely caused the arrest.

## 2021-05-03 NOTE — Progress Notes (Signed)
Pharmacy Antibiotic Note  [name] is a 50 y.o. female admitted on 05/01/2021 with pneumonia.  Pharmacy has been consulted for Unasyn dosing.  ID: Suspected aspiration pneumonia. Patient vomited during CPR. Tmax 101.3>>99.4 currently. WBC 10.9 down, Scr 1.21 up slightly.   Unasyn 10/3>>  10/3: BC x 2>>  Plan: Unasyn 3g IV q8hr . Dose ok for renal function Need height/weight for accurate CrCl calculation. Pharmacy will sign off. Please reconsult for further dosing assitance.   Height: 5\' 6"  (167.6 cm) IBW/kg (Calculated) : 59.3  Temp (24hrs), Avg:99.6 F (37.6 C), Min:98.2 F (36.8 C), Max:101.3 F (38.5 C)  Recent Labs  Lab 05/01/21 1112 05/01/21 1518 05/01/21 1612 05/01/21 1655 05/03/21 0147  WBC 12.1*  --  12.7*  --  10.9*  CREATININE  --  1.14*  --  1.10* 1.21*    CrCl cannot be calculated (Unknown ideal weight.).    Allergies  Allergen Reactions   Acetaminophen Hives, Nausea Only and Rash   Codeine Nausea Only    [name] S. 07/03/21, PharmD, BCPS Clinical Staff Pharmacist [email or web address]  Merilynn Finland 05/03/2021 10:13 AM

## 2021-05-03 NOTE — Assessment & Plan Note (Signed)
Patient is on multiple medication prior to admission.  Also has history of substance abuse.  May have taken extra medication by mistake.  Psychiatry consulted.  Recommend no further work-up.  Medication adjusted so far.

## 2021-05-03 NOTE — Progress Notes (Signed)
  Progress Note    [name]   DVV:[id number]  DOB: 1970/11/24  DOA: 05/01/2021     2 Date of Service: 05/03/2021     Subjective:  Wants to go home.  No nausea no vomiting.  No fever no chills.  No chest pain.  No acute complaints.  Hospital Problems * Cardiac arrest [facility]) Presents with out of hospital cardiac arrest.  Daughter started CPR. EMS provided Narcan with minimal response.  Started on Narcan drip.  Admitted to the ICU. Did not require intubation.  Currently does not have any respiratory or cardiac symptoms.  No further work-up as it appears that the patient actually had accidental drug overdose which likely caused the arrest.  Acute metabolic encephalopathy Secondary to multiple medications.  Currently close to baseline.  Polysubstance dependence (HCC) (cocaine and methamphetamines) Cocaine and vitamin positive in the UDS.  Counseled to quit.  Accidental overdose Patient is on multiple medication prior to admission.  Also has history of substance abuse.  May have taken extra medication by mistake.  Psychiatry consulted.  Recommend no further work-up.  Medication adjusted so far.  Aspiration pneumonia (HCC) Started on IV Unasyn.  Still has low-grade fever.  Continue to monitor overnight.  Schizoaffective disorder, bipolar type (HCC) Currently on Risperdal, Lexapro and Klonopin.  Outpatient follow-up.  PTSD (post-traumatic stress disorder) Management per psychiatry outpatient.  Alcohol abuse Prior history.  Monitor.  Smoker Nicotine patch.  Recommend to quit.     Objective Vital signs were reviewed and unremarkable.  Vitals:   05/03/21 0400 05/03/21 0748 05/03/21 1146 05/03/21 1628  BP: (!) 144/108 (!) 131/92 121/80 127/71  Pulse: 80 (!) 102 96 92  Resp: 18 16 16 16   Temp: 98.2 F (36.8 C) 99.4 F (37.4 C) (!) 100.7 F (38.2 C) 98.5 F (36.9 C)  TempSrc: Oral Oral Oral Oral  SpO2: 97% 91% 95% 92%  Height:          Exam Physical  Exam Constitutional:      General: She is not in acute distress. HENT:     Head: Normocephalic and atraumatic.  Eyes:     Extraocular Movements: Extraocular movements intact.     Pupils: Pupils are equal, round, and reactive to light.  Cardiovascular:     Rate and Rhythm: Normal rate and regular rhythm.     Heart sounds: No murmur heard. Pulmonary:     Effort: Pulmonary effort is normal.     Breath sounds: Normal breath sounds. No wheezing.  Abdominal:     General: Bowel sounds are normal.     Palpations: Abdomen is soft.  Neurological:     General: No focal deficit present.     Mental Status: She is alert and oriented to person, place, and time. Mental status is at baseline.  Psychiatric:        Mood and Affect: Mood normal.        Thought Content: Thought content normal.     Labs / Other Information My review of labs, imaging, notes and other tests shows no new significant findings.     Time spent: 35 minutes  [facility] 05/03/2021, 8:56 PM

## 2021-05-03 NOTE — Assessment & Plan Note (Signed)
Currently on Risperdal, Lexapro and Klonopin.  Outpatient follow-up.

## 2021-05-03 NOTE — Assessment & Plan Note (Signed)
Prior history.  Monitor. °

## 2021-05-04 ENCOUNTER — Other Ambulatory Visit (HOSPITAL_COMMUNITY): Payer: Self-pay

## 2021-05-04 LAB — URINALYSIS, COMPLETE (UACMP) WITH MICROSCOPIC
Bilirubin Urine: NEGATIVE
Glucose, UA: NEGATIVE mg/dL
Ketones, ur: NEGATIVE mg/dL
Nitrite: NEGATIVE
Protein, ur: 30 mg/dL — AB
RBC / HPF: >50 RBC/hpf — ABNORMAL HIGH (ref 0–5)
Specific Gravity, Urine: 1.016 (ref 1.005–1.030)
pH: 6 (ref 5.0–8.0)

## 2021-05-04 LAB — COMPREHENSIVE METABOLIC PANEL
ALT: 9 U/L (ref 0–44)
AST: 16 U/L (ref 15–41)
Albumin: 2.7 g/dL — ABNORMAL LOW (ref 3.5–5.0)
Alkaline Phosphatase: 70 U/L (ref 38–126)
Anion gap: 7 (ref 5–15)
BUN: 12 mg/dL (ref 6–20)
CO2: 31 mmol/L (ref 22–32)
Calcium: 8.5 mg/dL — ABNORMAL LOW (ref 8.9–10.3)
Chloride: 99 mmol/L (ref 98–111)
Creatinine, Ser: 1.23 mg/dL — ABNORMAL HIGH (ref 0.44–1.00)
GFR, Estimated: 53 mL/min — ABNORMAL LOW (ref >60)
Glucose, Bld: 121 mg/dL — ABNORMAL HIGH (ref 70–99)
Potassium: 3.4 mmol/L — ABNORMAL LOW (ref 3.5–5.1)
Sodium: 137 mmol/L (ref 135–145)
Total Bilirubin: 1 mg/dL (ref 0.3–1.2)
Total Protein: 6.3 g/dL — ABNORMAL LOW (ref 6.5–8.1)

## 2021-05-04 LAB — CBC WITH DIFFERENTIAL/PLATELET
Abs Immature Granulocytes: 0.05 10*3/uL (ref 0.00–0.07)
Basophils Absolute: 0 10*3/uL (ref 0.0–0.1)
Basophils Relative: 0 %
Eosinophils Absolute: 0 10*3/uL (ref 0.0–0.5)
Eosinophils Relative: 0 %
HCT: 36 % (ref 36.0–46.0)
Hemoglobin: 12 g/dL (ref 12.0–15.0)
Immature Granulocytes: 1 %
Lymphocytes Relative: 21 %
Lymphs Abs: 2.2 10*3/uL (ref 0.7–4.0)
MCH: 30.6 pg (ref 26.0–34.0)
MCHC: 33.3 g/dL (ref 30.0–36.0)
MCV: 91.8 fL (ref 80.0–100.0)
Monocytes Absolute: 0.6 10*3/uL (ref 0.1–1.0)
Monocytes Relative: 5 %
Neutro Abs: 7.5 10*3/uL (ref 1.7–7.7)
Neutrophils Relative %: 73 %
Platelets: 153 10*3/uL (ref 150–400)
RBC: 3.92 MIL/uL (ref 3.87–5.11)
RDW: 13.2 % (ref 11.5–15.5)
WBC: 10.4 10*3/uL (ref 4.0–10.5)
nRBC: 0 % (ref 0.0–0.2)

## 2021-05-04 LAB — MAGNESIUM: Magnesium: 1.9 mg/dL (ref 1.7–2.4)

## 2021-05-04 MED ORDER — POTASSIUM CHLORIDE CRYS ER 20 MEQ PO TBCR
40.0000 meq | EXTENDED_RELEASE_TABLET | Freq: Once | ORAL | Status: AC
  Administered 2021-05-04: 40 meq via ORAL
  Filled 2021-05-04: qty 2

## 2021-05-04 MED ORDER — AMOXICILLIN-POT CLAVULANATE 875-125 MG PO TABS
1.0000 | ORAL_TABLET | Freq: Two times a day (BID) | ORAL | 0 refills | Status: AC
  Filled 2021-05-04: qty 14, 7d supply, fill #0

## 2021-05-04 MED ORDER — THIAMINE HCL 100 MG PO TABS
100.0000 mg | ORAL_TABLET | Freq: Every day | ORAL | 0 refills | Status: DC
  Filled 2021-05-04: qty 30, 30d supply, fill #0

## 2021-05-04 MED ORDER — NICOTINE 21 MG/24HR TD PT24
21.0000 mg | MEDICATED_PATCH | Freq: Every day | TRANSDERMAL | 0 refills | Status: DC
  Filled 2021-05-04: qty 28, 28d supply, fill #0

## 2021-05-04 NOTE — Progress Notes (Signed)
Nursing DC note  Patient alert and oriented, verbalized understanding of dc instructions.All belongings given to patient.Piv dcd. Site unremarkable. Patient to go to the front main entrance via wheelchair when family arrives. TOC pharmacy to bring patients home meds in patients room.

## 2021-05-04 NOTE — TOC Initial Note (Signed)
Transition of Care [facility]) - Initial/Assessment Note    Patient Details  Name: [name] MRN: [id number] Date of Birth: April 21, 1971  Transition of Care [facility]) CM/SW Contact:    Kingsley Plan, RN Phone Number: 05/04/2021, 1:40 PM  Clinical Narrative:                  Spoke to patient at bedside. Patient has no PCP and no insurance. Scheduled follow up and to establish care with DR Gwinda Passe June 08, 2021 at 2:10 pm . Patient aware and information placed on AVS.   Prescriptions sent to [facility]. Patient entered in [facility] , cost to patient is $9.00 she states she can afford.   Patient aware she is only eligible for MATCH once a year, she can use pharmacy at [facility].  Expected Discharge Plan: Home/Self Care Barriers to Discharge: No Barriers Identified   Patient Goals and CMS Choice Patient states their goals for this hospitalization and ongoing recovery are:: to go home CMS Medicare.gov Compare Post Acute Care list provided to:: Patient    Expected Discharge Plan and Services Expected Discharge Plan: Home/Self Care In-house Referral: Financial Counselor Discharge Planning Services: CM Consult, [facility], [facility] Program, Medication Assistance, Follow-up appt scheduled   Living arrangements for the past 2 months: Single Family Home Expected Discharge Date: 05/04/21               DME Arranged: N/A DME Agency: NA       HH Arranged: NA          Prior Living Arrangements/Services Living arrangements for the past 2 months: Single Family Home Lives with:: Adult Children Patient language and need for interpreter reviewed:: Yes        Need for Family Participation in Patient Care: Yes (Comment) Care giver support system in place?: Yes (comment)   Criminal Activity/Legal Involvement Pertinent to Current Situation/Hospitalization: No - Comment as needed  Activities of Daily Living Home Assistive Devices/Equipment: None ADL  Screening (condition at time of admission) Patient's cognitive ability adequate to safely complete daily activities?: Yes Is the patient deaf or have difficulty hearing?: No Does the patient have difficulty seeing, even when wearing glasses/contacts?: No Does the patient have difficulty concentrating, remembering, or making decisions?: No Patient able to express need for assistance with ADLs?: Yes Does the patient have difficulty dressing or bathing?: No Independently performs ADLs?: Yes (appropriate for developmental age) Does the patient have difficulty walking or climbing stairs?: No Weakness of Legs: None Weakness of Arms/Hands: None  Permission Sought/Granted   Permission granted to share information with : No              Emotional Assessment Appearance:: Appears stated age     Orientation: : Oriented to Self, Oriented to Place, Oriented to  Time, Oriented to Situation      Admission diagnosis:  Cardiac arrest (HCC) [I46.9] Accidental overdose, initial encounter [T50.901A] Patient Active Problem List   Diagnosis Date Noted   Polysubstance dependence (HCC) (cocaine and methamphetamines) 05/03/2021   Acute metabolic encephalopathy 05/03/2021   Aspiration pneumonia (HCC) 05/03/2021   Cardiac arrest (HCC) 05/01/2021   Accidental overdose    Altered mental status    Polysubstance abuse (HCC)    Encephalopathy acute    Attention deficit hyperactivity disorder (ADHD) 12/08/2020   Panic attacks 06/29/2020   Insomnia due to other mental disorder 06/29/2020   PTSD (post-traumatic stress disorder) 06/29/2020   Schizoaffective disorder, bipolar type (HCC) 06/29/2020  Irritable bowel syndrome with constipation 08/16/2011   Bleeding internal hemorrhoids 04/23/2011   LUQ pain 04/19/2011   Smoker 04/09/2011   Alcohol abuse 04/09/2011   PCP:  Patient, No Pcp Per (Inactive) Pharmacy:   [id number] [place], [place] - [address] [place] [address] Phone: [phone] Fax: [phone]  [facility] [address] [place] [address] Phone: [phone] Fax: [phone]  [facility] [address] [place] [address] Phone: [phone] Fax: [phone]     Social Determinants of Health (SDOH) Interventions    Readmission Risk Interventions No flowsheet data found.

## 2021-05-06 LAB — CULTURE, BLOOD (ROUTINE X 2)
Culture: NO GROWTH
Culture: NO GROWTH
Special Requests: ADEQUATE
Special Requests: ADEQUATE

## 2021-05-07 NOTE — Discharge Summary (Addendum)
Physician Discharge Summary   Patient name: [name]  Admit date:     05/01/2021  Discharge date: 05/07/2021  Attending Physician: Cheri Fowler [[id number]]  Discharge Physician: Lynden Oxford   PCP: Patient, No Pcp Per (Inactive)    Follow-up Information     Meta Hatchet, PA. Schedule an appointment as soon as possible for a visit.   Specialty: Physician Assistant Contact information: [address] [place] [address] [phone]         Grayce Sessions, NP Follow up.   Specialty: Internal Medicine Why: June 08, 2021 at 2:10 pm Contact information: [address] [place] [address] [phone]                Recommendations at discharge: Establish care with PCP.  Follow-up with psychiatry.  Discharge Diagnoses Principal Problem:   Cardiac arrest [facility]) Active Problems:   Accidental overdose   Polysubstance dependence (HCC) (cocaine and methamphetamines)   Acute metabolic encephalopathy   PTSD (post-traumatic stress disorder)   Schizoaffective disorder, bipolar type (HCC)   Aspiration pneumonia (HCC)   Alcohol abuse   Smoker  Resolved Diagnoses Resolved Problems:   * No resolved hospital problems. [facility] Course   No notes on file   * Cardiac arrest [facility]) Presents with out of hospital cardiac arrest.  Daughter started CPR. EMS provided Narcan with minimal response.  Started on Narcan drip.  Admitted to the ICU. Did not require intubation.  Currently does not have any respiratory or cardiac symptoms.  No further work-up as it appears that the patient actually had accidental drug overdose which likely caused the arrest.  Acute metabolic encephalopathy Secondary to multiple medications.  Currently close to baseline.  Polysubstance dependence (HCC) (cocaine and methamphetamines) Cocaine and vitamin positive in the UDS.  Counseled to quit.  Accidental overdose Patient is on multiple medication prior to admission.   Also has history of substance abuse.  May have taken extra medication by mistake.  Psychiatry consulted.  Recommend no further work-up.  Medication adjusted so far.  Aspiration pneumonia (HCC) Started on IV Unasyn.  Afebrile for 24 hours.  Switch to Augmentin.  Schizoaffective disorder, bipolar type (HCC) Currently on Risperdal, Lexapro and Klonopin.  Outpatient follow-up.  PTSD (post-traumatic stress disorder) Management per psychiatry outpatient.  Alcohol abuse Prior history.  Monitor.  Smoker Nicotine patch.  Recommend to quit.    Procedures performed: none   Condition at discharge: good  Exam General: Appear in mild distress, no Rash; Oral Mucosa Clear, moist. no Abnormal Neck Mass Or lumps, Conjunctiva normal  Cardiovascular: S1 and S2 Present, no Murmur, Respiratory: good respiratory effort, Bilateral Air entry present and CTA, no Crackles, no wheezes Abdomen: Bowel Sound present, Soft and no tenderness Extremities: no Pedal edema Neurology: alert and oriented to time, place, and person affect appropriate. no new focal deficit Gait not checked due to patient safety concerns     Disposition: Home  Discharge time: greater than 30 minutes. Allergies as of 05/04/2021       Reactions   Acetaminophen Hives, Nausea Only, Rash   Codeine Nausea Only        Medication List     STOP taking these medications    Oxycodone HCl 10 MG Tabs   trazodone 300 MG tablet Commonly known as: DESYREL       TAKE these medications    amoxicillin-clavulanate 875-125 MG tablet Commonly known as: Augmentin Take 1 tablet by mouth 2 (two) times daily  for 7 days.   clonazePAM 1 MG tablet Commonly known as: KlonoPIN Take 1 tablet (1 mg total) by mouth 2 (two) times daily as needed for anxiety.   escitalopram 20 MG tablet Commonly known as: LEXAPRO Take 2 tablets (40 mg total) by mouth daily.   nicotine 21 mg/24hr patch Commonly known as: NICODERM CQ - dosed in mg/24  hours Place 1 patch (21 mg total) onto the skin daily.   risperiDONE 1 MG tablet Commonly known as: RISPERDAL TAKE 1 TABLET (1 MG TOTAL) BY MOUTH IN THE MORNING. What changed:  how much to take how to take this when to take this   risperiDONE 2 MG tablet Commonly known as: RISPERDAL TAKE 1 TABLET (2 MG TOTAL) BY MOUTH AT BEDTIME. What changed:  how much to take how to take this when to take this   thiamine 100 MG tablet Take 1 tablet (100 mg total) by mouth daily.        CT HEAD WO CONTRAST ( )  Result Date: 05/01/2021 CLINICAL DATA:  Found unresponsive EXAM: CT HEAD WITHOUT CONTRAST TECHNIQUE: Contiguous axial images were obtained from the base of the skull through the vertex without intravenous contrast. COMPARISON:  None. FINDINGS: Brain: There is no evidence of acute intracranial hemorrhage, extra-axial fluid collection, or acute infarct. The ventricles are normal in size. There is no mass lesion. There is no midline shift. Vascular: No hyperdense vessel or unexpected calcification. Skull: Normal. Negative for fracture or focal lesion. Sinuses/Orbits: There is layering fluid in the right maxillary and sphenoid sinuses. The imaged globes and orbits are unremarkable. Other: None. IMPRESSION: 1. No acute intracranial pathology. 2. Layering fluid in the right maxillary and sphenoid sinuses which can be seen with acute sinusitis in the correct clinical setting. Electronically Signed   By: Lesia Hausen M.D.   On: 05/01/2021 16:19   DG Chest Port 1 View  Result Date: 05/01/2021 CLINICAL DATA:  Short of breath and cough EXAM: PORTABLE CHEST 1 VIEW COMPARISON:  04/01/2009 FINDINGS: The heart size and mediastinal contours are within normal limits. Both lungs are clear. The visualized skeletal structures are unremarkable. IMPRESSION: No active disease. Electronically Signed   By: Signa Kell M.D.   On: 05/01/2021 11:16   ECHOCARDIOGRAM LIMITED  Result Date: 05/02/2021     ECHOCARDIOGRAM LIMITED REPORT   Patient Name:   ORIAH LEINWEBER Date of Exam: 05/02/2021 Medical Rec #:  [id number]   Height:       66.0 in Accession #:    [id number]  Weight:       203.0 lb Date of Birth:  09-17-1970    BSA:          2.012 m Patient Age:    50 years    BP:           87/60 mmHg Patient Gender: F           HR:           86 bpm. Exam Location:  Inpatient Procedure: Limited Echo, Limited Color Doppler, Cardiac Doppler, 3D Echo and            Strain Analysis Indications:    Cardiac arrest I46.9  History:        Patient has no prior history of Echocardiogram examinations.                 Wolf-Parkinson-White syndrome. ETOH abuse.  Sonographer:    Leta Jungling RDCS Referring Phys: [id number] GRACE E BOWSER  Sonographer  Comments: Global longitudinal strain was attempted. IMPRESSIONS  1. Left ventricular ejection fraction, by estimation, is 55 to 60%. The left ventricle has normal function. The left ventricle has no regional wall motion abnormalities. Left ventricular diastolic parameters were normal. The average left ventricular global longitudinal strain is -20.6 %. The global longitudinal strain is normal.  2. Right ventricular systolic function is normal. The right ventricular size is normal.  3. Left atrial size was mildly dilated.  4. The mitral valve is normal in structure. No evidence of mitral valve regurgitation. No evidence of mitral stenosis.  5. The aortic valve is tricuspid. Aortic valve regurgitation is not visualized. Mild aortic valve sclerosis is present, with no evidence of aortic valve stenosis.  6. The inferior vena cava is normal in size with greater than 50% respiratory variability, suggesting right atrial pressure of 3 mmHg. FINDINGS  Left Ventricle: Left ventricular ejection fraction, by estimation, is 55 to 60%. The left ventricle has normal function. The left ventricle has no regional wall motion abnormalities. The average left ventricular global longitudinal strain is -20.6 %. The global  longitudinal strain is normal. The left ventricular internal cavity size was normal in size. There is no left ventricular hypertrophy. Left ventricular diastolic parameters were normal. Right Ventricle: The right ventricular size is normal. No increase in right ventricular wall thickness. Right ventricular systolic function is normal. Left Atrium: Left atrial size was mildly dilated. Right Atrium: Right atrial size was normal in size. Pericardium: There is no evidence of pericardial effusion. Mitral Valve: The mitral valve is normal in structure. No evidence of mitral valve stenosis. Tricuspid Valve: The tricuspid valve is normal in structure. Tricuspid valve regurgitation is not demonstrated. No evidence of tricuspid stenosis. Aortic Valve: The aortic valve is tricuspid. Aortic valve regurgitation is not visualized. Mild aortic valve sclerosis is present, with no evidence of aortic valve stenosis. Pulmonic Valve: The pulmonic valve was normal in structure. Pulmonic valve regurgitation is not visualized. No evidence of pulmonic stenosis. Aorta: The aortic root is normal in size and structure. Venous: The inferior vena cava is normal in size with greater than 50% respiratory variability, suggesting right atrial pressure of 3 mmHg. IAS/Shunts: No atrial level shunt detected by color flow Doppler. LEFT VENTRICLE PLAX 2D LVIDd:         4.00 cm  Diastology LVIDs:         2.70 cm  LV e' medial:    7.17 cm/s LV PW:         1.00 cm  LV E/e' medial:  11.7 LV IVS:        1.10 cm  LV e' lateral:   8.03 cm/s LVOT diam:     2.10 cm  LV E/e' lateral: 10.4 LV SV:         88 LV SV Index:   44       2D Longitudinal Strain LVOT Area:     3.46 cm 2D Strain GLS Avg:     -20.6 %  LEFT ATRIUM             Index LA diam:        3.80 cm 1.89 cm/m LA Vol (A2C):   37.0 ml 18.39 ml/m LA Vol (A4C):   48.5 ml 24.10 ml/m LA Biplane Vol: 42.9 ml 21.32 ml/m  AORTIC VALVE LVOT Vmax:   134.00 cm/s LVOT Vmean:  93.300 cm/s LVOT VTI:    0.255 m  MITRAL VALVE MV Area (PHT): 3.49 cm  SHUNTS MV Decel Time: 218 msec    Systemic VTI:  0.26 m MV E velocity: 83.55 cm/s  Systemic Diam: 2.10 cm MV A velocity: 91.15 cm/s MV E/A ratio:  0.92 Charlton Haws MD Electronically signed by Charlton Haws MD Signature Date/Time: 05/02/2021/8:54:33 AM    Final    Results for orders placed or performed during the hospital encounter of 05/01/21  Resp Panel by RT-PCR (Flu A&B, Covid) Nasopharyngeal Swab     Status: None   Collection Time: 05/01/21 11:12 AM   Specimen: Nasopharyngeal Swab; Nasopharyngeal(NP) swabs in vial transport medium  Result Value Ref Range Status   SARS Coronavirus 2 by RT PCR NEGATIVE NEGATIVE Final    Comment: (NOTE) SARS-CoV-2 target nucleic acids are NOT DETECTED.  The SARS-CoV-2 RNA is generally detectable in upper respiratory specimens during the acute phase of infection. The lowest concentration of SARS-CoV-2 viral copies this assay can detect is 138 copies/mL. A negative result does not preclude SARS-Cov-2 infection and should not be used as the sole basis for treatment or other patient management decisions. A negative result may occur with  improper specimen collection/handling, submission of specimen other than nasopharyngeal swab, presence of viral mutation(s) within the areas targeted by this assay, and inadequate number of viral copies(<138 copies/mL). A negative result must be combined with clinical observations, patient history, and epidemiological information. The expected result is Negative.  Fact Sheet for Patients:  BloggerCourse.com  Fact Sheet for Healthcare Providers:  SeriousBroker.it  This test is no t yet approved or cleared by the [place] FDA and  has been authorized for detection and/or diagnosis of SARS-CoV-2 by FDA under an Emergency Use Authorization (EUA). This EUA will remain  in effect (meaning this test can be used) for the duration of  the COVID-19 declaration under Section 564(b)(1) of the Act, 21 U.S.C.section 360bbb-3(b)(1), unless the authorization is terminated  or revoked sooner.       Influenza A by PCR NEGATIVE NEGATIVE Final   Influenza B by PCR NEGATIVE NEGATIVE Final    Comment: (NOTE) The Xpert Xpress SARS-CoV-2/FLU/RSV plus assay is intended as an aid in the diagnosis of influenza from Nasopharyngeal swab specimens and should not be used as a sole basis for treatment. Nasal washings and aspirates are unacceptable for Xpert Xpress SARS-CoV-2/FLU/RSV testing.  Fact Sheet for Patients: BloggerCourse.com  Fact Sheet for Healthcare Providers: SeriousBroker.it  This test is not yet approved or cleared by the [place] FDA and has been authorized for detection and/or diagnosis of SARS-CoV-2 by FDA under an Emergency Use Authorization (EUA). This EUA will remain in effect (meaning this test can be used) for the duration of the COVID-19 declaration under Section 564(b)(1) of the Act, 21 U.S.C. section 360bbb-3(b)(1), unless the authorization is terminated or revoked.  Performed at [facility], [address] [place], [place] [address]   Culture, blood (routine x 2)     Status: None   Collection Time: 05/01/21  4:13 PM   Specimen: BLOOD LEFT HAND  Result Value Ref Range Status   Specimen Description BLOOD LEFT HAND  Final   Special Requests   Final    BOTTLES DRAWN AEROBIC AND ANAEROBIC Blood Culture adequate volume   Culture   Final    NO GROWTH 5 DAYS Performed at [facility], [address], [place], [place] [address]    Report Status 05/06/2021 FINAL  Final  Culture, blood (routine x 2)     Status: None  Collection Time: 05/01/21  4:13 PM   Specimen: BLOOD RIGHT FOREARM  Result Value Ref Range Status   Specimen Description BLOOD RIGHT FOREARM  Final   Special Requests   Final    BOTTLES DRAWN AEROBIC AND ANAEROBIC Blood  Culture adequate volume   Culture   Final    NO GROWTH 5 DAYS Performed at [facility], [address] [place], [place] [address]    Report Status 05/06/2021 FINAL  Final    Signed:  Lynden Oxford MD.  [facility] 05/07/2021, 9:22 AM

## 2021-05-17 ENCOUNTER — Other Ambulatory Visit: Payer: Self-pay

## 2021-05-24 ENCOUNTER — Other Ambulatory Visit: Payer: Self-pay

## 2021-05-25 ENCOUNTER — Other Ambulatory Visit: Payer: Self-pay

## 2021-05-31 ENCOUNTER — Other Ambulatory Visit: Payer: Self-pay

## 2021-06-08 ENCOUNTER — Inpatient Hospital Stay (INDEPENDENT_AMBULATORY_CARE_PROVIDER_SITE_OTHER): Payer: Medicaid Other | Admitting: Primary Care

## 2021-06-14 ENCOUNTER — Other Ambulatory Visit: Payer: Self-pay

## 2021-06-14 ENCOUNTER — Telehealth (INDEPENDENT_AMBULATORY_CARE_PROVIDER_SITE_OTHER): Payer: No Payment, Other | Admitting: Physician Assistant

## 2021-06-14 DIAGNOSIS — F250 Schizoaffective disorder, bipolar type: Secondary | ICD-10-CM

## 2021-06-14 DIAGNOSIS — F909 Attention-deficit hyperactivity disorder, unspecified type: Secondary | ICD-10-CM

## 2021-06-14 DIAGNOSIS — F5105 Insomnia due to other mental disorder: Secondary | ICD-10-CM

## 2021-06-14 DIAGNOSIS — F99 Mental disorder, not otherwise specified: Secondary | ICD-10-CM

## 2021-06-14 DIAGNOSIS — F4310 Post-traumatic stress disorder, unspecified: Secondary | ICD-10-CM | POA: Diagnosis not present

## 2021-06-14 DIAGNOSIS — F410 Panic disorder [episodic paroxysmal anxiety] without agoraphobia: Secondary | ICD-10-CM

## 2021-06-14 MED ORDER — CLONAZEPAM 1 MG PO TABS: 1.0000 mg | ORAL_TABLET | Freq: Two times a day (BID) | ORAL | 1 refills | Status: DC | PRN

## 2021-06-14 MED ORDER — GUANFACINE HCL 1 MG PO TABS
1.0000 mg | ORAL_TABLET | Freq: Every day | ORAL | 1 refills | Status: DC
  Filled 2021-06-14: qty 30, 30d supply, fill #0
  Filled 2021-07-11: qty 30, 30d supply, fill #1

## 2021-06-14 MED ORDER — RISPERIDONE 2 MG PO TABS
2.0000 mg | ORAL_TABLET | Freq: Every day | ORAL | 2 refills | Status: DC
  Filled 2021-06-14: qty 30, 30d supply, fill #0
  Filled 2021-07-31: qty 30, 30d supply, fill #1

## 2021-06-14 MED ORDER — ESCITALOPRAM OXALATE 20 MG PO TABS
40.0000 mg | ORAL_TABLET | Freq: Every day | ORAL | 2 refills | Status: DC
  Filled 2021-06-14: qty 60, 30d supply, fill #0
  Filled 2021-07-31: qty 60, 30d supply, fill #1

## 2021-06-14 MED ORDER — RISPERIDONE 1 MG PO TABS
1.0000 mg | ORAL_TABLET | Freq: Every day | ORAL | 2 refills | Status: DC
  Filled 2021-06-14: qty 30, 30d supply, fill #0
  Filled 2021-07-31: qty 30, 30d supply, fill #1

## 2021-06-14 NOTE — Progress Notes (Addendum)
BH MD/PA/NP OP Progress Note  Virtual Visit via Telephone Note  I connected with [name] on 06/14/21 at  2:30 PM EST by telephone and verified that I am speaking with the correct person using two identifiers.  Location: Patient: Home Provider: Clinic   I discussed the limitations, risks, security and privacy concerns of performing an evaluation and management service by telephone and the availability of in person appointments. I also discussed with the patient that there may be a patient responsible charge related to this service. The patient expressed understanding and agreed to proceed.  Follow Up Instructions:   I discussed the assessment and treatment plan with the patient. The patient was provided an opportunity to ask questions and all were answered. The patient agreed with the plan and demonstrated an understanding of the instructions.   The patient was advised to call back or seek an in-person evaluation if the symptoms worsen or if the condition fails to improve as anticipated.  I provided 21 minutes of non-face-to-face time during this encounter.  Meta Hatchet, PA    06/14/2021 2:39 PM Elfreda Blanchet  MRN:  [id number]  Chief Complaint: Follow up and medication management  HPI:   [name] is a 49 year old female with a past psychiatric history significant for attention deficit hyperactivity disorder, insomnia, PTSD, schizoaffective disorder (bipolar type), and panic attacks who presents to [facility] via virtual telephone visit for follow-up and medication management.  Patient is currently being managed on the following medications:  Escitalopram 40 mg daily Clonazepam 1 mg 2 times daily as needed Trazodone 300 mg at bedtime Risperidone 1 mg in the morning/2 mg at bedtime Benztropine 0.5 mg 2 times daily  Patient was recently hospitalized due to drug overdose.  During the incident, patient was found unresponsive in her  chair by her daughter.  Paramedics eventually arise and gave the patient Narcan to which she became somewhat responsive.  Since her hospitalization, patient states that she has discontinued taking her trazodone and Strattera.  She states that she stopped taking her trazodone after being recently diagnosed with sleep apnea.  Patient is supposed to follow up with a sleep study but patient is unable to due to having no insurance.  Patient reports that she still continues to wake up from being unable to breathe.  Patient reports no other issues with the current medications she is taking.  She does report that she continues to deal with lack of focus and notes that she has been down.  In regards to her focus, patient states that she often feels scattered and that she will jump from idea to idea when conversing with people. She has also noticed having less energy and feels that she dragging along.  Patient endorses irritability she rates an 8 out of 10.  She is often irritated by minor things such as dishes still being in the sink or certain areas of the house not being clean.  Whenever she is irritable, patient states that she is restless and finds it hard to be comfortable.  Patient is alert and oriented x4, calm, cooperative, and fully engaged in conversation during the encounter.  Patient states that her mood is fine.  Patient denies suicidal or homicidal ideations.  She further denies auditory or visual hallucinations and does not appear to be responding to internal/external stimuli.  Patient endorses poor sleep stating that she often wakes up intermittently.  Whenever she wakes up, she almost always feels drained.  Patient  endorses fair appetite and eats on average 2 meals per day.  Patient denies alcohol consumption.  She endorses tobacco use and smokes on average a pack per day.  Patient denies current illicit drug use and states that she last used illicit substances on October 3rd.  While hospitalized, a  urine drug analysis was positive for benzodiazepine, cocaine, and methamphetamine.  Visit Diagnosis:    ICD-10-CM   1. Attention deficit hyperactivity disorder (ADHD), unspecified ADHD type  F90.9 guanFACINE (TENEX) 1 MG tablet    2. Schizoaffective disorder, bipolar type (HCC)  F25.0 risperiDONE (RISPERDAL) 1 MG tablet    risperiDONE (RISPERDAL) 2 MG tablet    escitalopram (LEXAPRO) 20 MG tablet    3. Insomnia due to other mental disorder  F51.05 escitalopram (LEXAPRO) 20 MG tablet   F99     4. PTSD (post-traumatic stress disorder)  F43.10 escitalopram (LEXAPRO) 20 MG tablet    5. Panic attacks  F41.0 escitalopram (LEXAPRO) 20 MG tablet    clonazePAM (KLONOPIN) 1 MG tablet      Past Psychiatric History:  Schizoaffective, bipolar type Insomnia Anxiety PTSD Panic attacks Attention deficit hyperactivity disorder  Past Medical History:  Past Medical History:  Diagnosis Date   Alcohol abuse    Anxiety    Arthritis    Bipolar disorder (HCC)    Depression    GERD (gastroesophageal reflux disease)    H/O hiatal hernia    Hemorrhoids    Small bowel obstruction (HCC) 2012   Wolf-Parkinson-White syndrome    Wolf-Parkinson-White syndrome    per patient    Past Surgical History:  Procedure Laterality Date   ESOPHAGOGASTRODUODENOSCOPY  06/07/2011   Procedure: ESOPHAGOGASTRODUODENOSCOPY (EGD);  Surgeon: Rob Bunting, MD;  Location: [facility];  Service: Endoscopy;  Laterality: N/A;   TUBAL LIGATION      Family Psychiatric History:  Grandfather's sister has schizophrenia Grandmother's brother has a mental illness of some sort  Family History:  Family History  Problem Relation Age of Onset   Breast cancer Maternal Aunt    Colon polyps Maternal Grandmother    Diabetes Maternal Grandmother    Heart attack Maternal Grandmother    Kidney cancer Maternal Grandmother    Breast cancer Maternal Grandmother    Colon cancer Neg Hx    Anesthesia problems Neg Hx    Hypotension  Neg Hx    Malignant hyperthermia Neg Hx    Pseudochol deficiency Neg Hx    Heart disease Father    Heart disease Paternal Grandfather    Heart disease Paternal Grandmother     Social History:  Social History   Socioeconomic History   Marital status: Single    Spouse name: Not on file   Number of children: 4   Years of education: Not on file   Highest education level: Not on file  Occupational History   Occupation: house wife  Tobacco Use   Smoking status: Every Day    Packs/day: 1.00    Types: Cigarettes   Smokeless tobacco: Never  Substance and Sexual Activity   Alcohol use: No    Comment: previous alcohol abuse but for last 2 weeks 2 drinks. not currently drinking alcohol   Drug use: No   Sexual activity: Not Currently    Birth control/protection: None  Other Topics Concern   Not on file  Social History Narrative   Not on file   Social Determinants of Health   Financial Resource Strain: Not on file  Food Insecurity: Not on file  Transportation Needs: Not on file  Physical Activity: Not on file  Stress: Not on file  Social Connections: Not on file    Allergies:  Allergies  Allergen Reactions   Acetaminophen Hives, Nausea Only and Rash   Codeine Nausea Only    Metabolic Disorder Labs: No results found for: HGBA1C, MPG No results found for: PROLACTIN Lab Results  Component Value Date   CHOL  01/03/2009    147        ATP III CLASSIFICATION:  <200     mg/dL   Desirable  478-295  mg/dL   Borderline High  >=621    mg/dL   High          TRIG 308 01/03/2009   HDL 19 (L) 01/03/2009   CHOLHDL 7.7 01/03/2009   VLDL 28 01/03/2009   LDLCALC (H) 01/03/2009    100        Total Cholesterol/HDL:CHD Risk Coronary Heart Disease Risk Table                     Men   Women  1/2 Average Risk   3.4   3.3  Average Risk       5.0   4.4  2 X Average Risk   9.6   7.1  3 X Average Risk  23.4   11.0        Use the calculated Patient Ratio above and the CHD Risk  Table to determine the patient's CHD Risk.        ATP III CLASSIFICATION (LDL):  <100     mg/dL   Optimal  657-846  mg/dL   Near or Above                    Optimal  130-159  mg/dL   Borderline  962-952  mg/dL   High  >841     mg/dL   Very High   Lab Results  Component Value Date   TSH 3.259 Test methodology is 3rd generation TSH 01/03/2009    Therapeutic Level Labs: No results found for: LITHIUM No results found for: VALPROATE No components found for:  CBMZ  Current Medications: Current Outpatient Medications  Medication Sig Dispense Refill   guanFACINE (TENEX) 1 MG tablet Take 1 tablet (1 mg total) by mouth at bedtime. 30 tablet 1   clonazePAM (KLONOPIN) 1 MG tablet Take 1 tablet (1 mg total) by mouth 2 (two) times daily as needed for anxiety. 60 tablet 1   escitalopram (LEXAPRO) 20 MG tablet Take 2 tablets (40 mg total) by mouth daily. 60 tablet 2   nicotine (NICODERM CQ - DOSED IN MG/24 HOURS) 21 mg/24hr patch Place 1 patch (21 mg total) onto the skin daily. 28 patch 0   risperiDONE (RISPERDAL) 1 MG tablet Take 1 tablet (1 mg total) by mouth daily. 30 tablet 2   risperiDONE (RISPERDAL) 2 MG tablet Take 1 tablet (2 mg total) by mouth at bedtime. 30 tablet 2   thiamine 100 MG tablet Take 1 tablet (100 mg total) by mouth daily. 30 tablet 0   No current facility-administered medications for this visit.     Musculoskeletal: Strength & Muscle Tone: Unable to assess due to telemedicine visit Gait & Station: Unable to assess due to telemedicine visit Patient leans: Unable to assess due to telemedicine visit  Psychiatric Specialty Exam: Review of Systems  Psychiatric/Behavioral:  Positive for decreased concentration and sleep disturbance. Negative for dysphoric mood, hallucinations, self-injury  and suicidal ideas. The patient is nervous/anxious. The patient is not hyperactive.    There were no vitals taken for this visit.There is no height or weight on file to calculate BMI.   General Appearance: Unable to assess due to telemedicine visit  Eye Contact:  Unable to assess due to telemedicine visit  Speech:  Clear and Coherent and Normal Rate  Volume:  Normal  Mood:  Anxious and Depressed  Affect:  Congruent and Depressed  Thought Process:  Coherent, Goal Directed, and Descriptions of Associations: Intact  Orientation:  Full (Time, Place, and Person)  Thought Content: WDL and Tangential   Suicidal Thoughts:  No  Homicidal Thoughts:  No  Memory:  Immediate;   Good Recent;   Fair Remote;   Fair  Judgement:  Fair  Insight:  Fair  Psychomotor Activity:  Normal  Concentration:  Concentration: Good and Attention Span: Good  Recall:  Good  Fund of Knowledge: Good  Language: Good  Akathisia:  NA  Handed:  Right  AIMS (if indicated): not done  Assets:  Communication Skills Desire for Improvement Housing  ADL's:  Intact  Cognition: WNL  Sleep:  Poor   Screenings: GAD-7    Flowsheet Row Video Visit from 06/14/2021 in [facility] Video Visit from 04/11/2021 in [facility] Video Visit from 12/08/2020 in [facility] Video Visit from 09/21/2020 in [facility]  Total GAD-7 Score 20 21 13 12       PHQ2-9    Flowsheet Row Video Visit from 06/14/2021 in [facility] Video Visit from 04/11/2021 in [facility] Video Visit from 12/08/2020 in [facility] Video Visit from 09/21/2020 in [facility] Counselor from 05/27/2020 in [facility]  PHQ-2 Total Score 2 4 1 1 6   PHQ-9 Total Score 16 15 -- -- 20      Flowsheet Row Video Visit from 06/14/2021 in [facility] ED to Hosp-Admission (Discharged) from 05/01/2021 in [facility]  SURGICAL Video Visit from 04/11/2021 in  [facility]  C-SSRS RISK CATEGORY Low Risk No Risk No Risk        Assessment and Plan:   [name] is a 50 year old female with a past psychiatric history significant for attention deficit hyperactivity disorder, insomnia, PTSD, schizoaffective disorder (bipolar type), and panic attacks who presents to [facility] via virtual telephone visit for follow-up and medication management.  Patient reports that she has been having issues with her sleep and is still having issues with focusing.  Patient's sleep disturbances are attributed to her recent diagnosis of sleep apnea.  Patient was referred for a sleep study but patient is unable to make the referral due to lack of insurance.  Patient was encouraged to apply for Medicaid so that she can have the sleep study done.  Due to patient's past use of illicit substances, provider would not place patient on stimulant based medication for the management of her ADHD.  Provider recommended guanfacine 1 mg at bedtime for the management of her ADHD.  Patient agreeable to recommendation.  Patient's medications to be e-prescribed to pharmacy of choice.  1. Schizoaffective disorder, bipolar type (HCC)  - risperiDONE (RISPERDAL) 1 MG tablet; Take 1 tablet (1 mg total) by mouth daily.  Dispense: 30 tablet; Refill: 2 - risperiDONE (RISPERDAL) 2  MG tablet; Take 1 tablet (2 mg total) by mouth at bedtime.  Dispense: 30 tablet; Refill: 2 - escitalopram (LEXAPRO) 20 MG tablet; Take 2 tablets (40 mg total) by mouth daily.  Dispense: 60 tablet; Refill: 2  2. Insomnia due to other mental disorder  - escitalopram (LEXAPRO) 20 MG tablet; Take 2 tablets (40 mg total) by mouth daily.  Dispense: 60 tablet; Refill: 2  3. PTSD (post-traumatic stress disorder)  - escitalopram (LEXAPRO) 20 MG tablet; Take 2 tablets (40 mg total) by mouth daily.  Dispense: 60 tablet; Refill: 2  4. Panic attacks  -  escitalopram (LEXAPRO) 20 MG tablet; Take 2 tablets (40 mg total) by mouth daily.  Dispense: 60 tablet; Refill: 2 - clonazePAM (KLONOPIN) 1 MG tablet; Take 1 tablet (1 mg total) by mouth 2 (two) times daily as needed for anxiety.  Dispense: 60 tablet; Refill: 1  5. Attention deficit hyperactivity disorder (ADHD), unspecified ADHD type  - guanFACINE (TENEX) 1 MG tablet; Take 1 tablet (1 mg total) by mouth at bedtime.  Dispense: 30 tablet; Refill: 1  Patient to follow up in 2 months Provider spent a total 21 minutes with the patient/reviewing patient's chart  Meta Hatchet, PA 06/14/2021, 2:39 PM

## 2021-06-15 ENCOUNTER — Other Ambulatory Visit: Payer: Self-pay

## 2021-06-15 ENCOUNTER — Encounter (HOSPITAL_COMMUNITY): Payer: Self-pay | Admitting: Physician Assistant

## 2021-06-16 ENCOUNTER — Other Ambulatory Visit: Payer: Self-pay

## 2021-06-16 NOTE — Telephone Encounter (Signed)
Message acknowledged and reviewed.

## 2021-07-11 ENCOUNTER — Other Ambulatory Visit: Payer: Self-pay

## 2021-07-12 ENCOUNTER — Other Ambulatory Visit: Payer: Self-pay

## 2021-07-18 ENCOUNTER — Other Ambulatory Visit: Payer: Self-pay

## 2021-07-31 ENCOUNTER — Other Ambulatory Visit: Payer: Self-pay

## 2021-08-01 ENCOUNTER — Other Ambulatory Visit: Payer: Self-pay

## 2021-08-08 ENCOUNTER — Telehealth (INDEPENDENT_AMBULATORY_CARE_PROVIDER_SITE_OTHER): Payer: No Payment, Other | Admitting: Physician Assistant

## 2021-08-08 DIAGNOSIS — F5105 Insomnia due to other mental disorder: Secondary | ICD-10-CM

## 2021-08-08 DIAGNOSIS — F250 Schizoaffective disorder, bipolar type: Secondary | ICD-10-CM

## 2021-08-08 DIAGNOSIS — F410 Panic disorder [episodic paroxysmal anxiety] without agoraphobia: Secondary | ICD-10-CM

## 2021-08-08 DIAGNOSIS — F4310 Post-traumatic stress disorder, unspecified: Secondary | ICD-10-CM

## 2021-08-08 DIAGNOSIS — F99 Mental disorder, not otherwise specified: Secondary | ICD-10-CM

## 2021-08-08 DIAGNOSIS — F909 Attention-deficit hyperactivity disorder, unspecified type: Secondary | ICD-10-CM

## 2021-08-08 MED ORDER — ESCITALOPRAM OXALATE 20 MG PO TABS: 40.0000 mg | ORAL_TABLET | Freq: Every day | ORAL | 2 refills | Status: DC

## 2021-08-08 MED ORDER — GUANFACINE HCL 2 MG PO TABS: 2.0000 mg | ORAL_TABLET | Freq: Every day | ORAL | 1 refills | Status: DC

## 2021-08-08 MED ORDER — RISPERIDONE 1 MG PO TABS: 1.0000 mg | ORAL_TABLET | Freq: Every day | ORAL | 2 refills | Status: DC

## 2021-08-08 MED ORDER — RISPERIDONE 2 MG PO TABS: 2.0000 mg | ORAL_TABLET | Freq: Every day | ORAL | 2 refills | Status: DC

## 2021-08-10 ENCOUNTER — Other Ambulatory Visit (HOSPITAL_COMMUNITY): Payer: Self-pay | Admitting: *Deleted

## 2021-08-10 ENCOUNTER — Encounter (HOSPITAL_COMMUNITY): Payer: Self-pay | Admitting: Physician Assistant

## 2021-08-10 ENCOUNTER — Other Ambulatory Visit: Payer: Self-pay

## 2021-08-10 ENCOUNTER — Telehealth (HOSPITAL_COMMUNITY): Payer: Self-pay | Admitting: *Deleted

## 2021-08-10 DIAGNOSIS — F909 Attention-deficit hyperactivity disorder, unspecified type: Secondary | ICD-10-CM

## 2021-08-10 MED ORDER — GUANFACINE HCL 2 MG PO TABS
2.0000 mg | ORAL_TABLET | Freq: Every day | ORAL | 1 refills | Status: DC
  Filled 2021-08-10: qty 30, 30d supply, fill #0

## 2021-08-10 MED ORDER — RISPERIDONE 2 MG PO TABS
2.0000 mg | ORAL_TABLET | Freq: Every day | ORAL | 2 refills | Status: DC
  Filled 2021-08-10 – 2021-09-19 (×2): qty 30, 30d supply, fill #0

## 2021-08-10 MED ORDER — RISPERIDONE 1 MG PO TABS
1.0000 mg | ORAL_TABLET | Freq: Every day | ORAL | 2 refills | Status: DC
  Filled 2021-08-10 – 2021-09-19 (×2): qty 30, 30d supply, fill #0

## 2021-08-10 MED ORDER — CLONAZEPAM 1 MG PO TABS: 1.0000 mg | ORAL_TABLET | Freq: Two times a day (BID) | ORAL | 1 refills | Status: DC | PRN

## 2021-08-10 MED ORDER — ESCITALOPRAM OXALATE 20 MG PO TABS
40.0000 mg | ORAL_TABLET | Freq: Every day | ORAL | 2 refills | Status: DC
  Filled 2021-08-10: qty 60, 30d supply, fill #0

## 2021-08-10 NOTE — Telephone Encounter (Signed)
Call from patient asking for a new rx for her Klonopin, she should be out in 4 days, also her Tenex was called to [facility] and she gets all her non controlled meds at [facility] pharmacy as its cheaper. She asked that the Tenex be moved. Writer will reach out to the provider for a new rx for her Klonopin and I moved her Tenex rx to [facility].

## 2021-08-10 NOTE — Telephone Encounter (Signed)
Provider was contacted by Orpah Clinton. Reola Calkins, RN regarding patient's medication regimen and Klonopin prescription. Patient's medications sent to appropriate pharmacies. Patient's Klonopin sent to pharmacy of choice and will be available for pick on January 15th.

## 2021-08-10 NOTE — Progress Notes (Addendum)
BH MD/PA/NP OP Progress Note  Virtual Visit via Telephone Note  I connected with [name] on 08/08/21 at  3:00 PM EST by telephone and verified that I am speaking with the correct person using two identifiers.  Location: Patient: Home Provider: Clinic   I discussed the limitations, risks, security and privacy concerns of performing an evaluation and management service by telephone and the availability of in person appointments. I also discussed with the patient that there may be a patient responsible charge related to this service. The patient expressed understanding and agreed to proceed.  Follow Up Instructions:  I discussed the assessment and treatment plan with the patient. The patient was provided an opportunity to ask questions and all were answered. The patient agreed with the plan and demonstrated an understanding of the instructions.   The patient was advised to call back or seek an in-person evaluation if the symptoms worsen or if the condition fails to improve as anticipated.  I provided 19 minutes of non-face-to-face time during this encounter.  Meta Hatchet, PA   08/10/2021 8:21 PM Sashay Felling  MRN:  [id number]  Chief Complaint: Follow up and medication management  HPI:   [name] is a 51 year old female with a past psychiatric history significant for attention deficit hyperactivity disorder, insomnia, PTSD, schizoaffective disorder (bipolar type), and panic attacks who presents to [facility] via virtual telephone visit for follow-up and medication management. Patient is  currently being managed on the following medications:  Guanfacine (Tenex) 1 mg at bedtime Escitalopram 40 mg daily Clonazepam 1 mg 2 times daily as needed Risperidone 1 mg in the morning/2 mg at bedtime Benztropine 0.5 mg 2 times daily  Patient reports that guanfacine was not helpful in the management of her symptoms related to her ADHD. Patient has  since discontinued taking her guanfacine. Patient reports no issues or concerns with her other medications. She denies depressive symptoms but states that her anxiety has been through  the roof. Patient rates her anxiety a 9 out of 10 and often finds herself pacing about the house. Patient attributes her elevated anxiety to the recent news that her daughter is pregnant with her 3rd child. Patient denies any other stressors at this time. A PHQ-9 screen was performed with the patient scoring a 17.  A GAD-7 screen was also performed with the patient scoring a 17.  Patient is alert and oriented x4, calm, cooperative, and fully engaged in conversation during the encounter.  Patient endorses fine mood but endorses some agitation.  Patient denies suicidal or homicidal ideations.  She further denies auditory or visual hallucinations and does not appear to be responding to internal/external stimuli.  Patient endorses poor sleep and states that she was unable to sleep the night before.  Patient endorses fair appetite and states that she nibbles on food throughout the day.  Patient denies alcohol consumption and illicit drug use.  Patient endorses tobacco use and smokes on average a pack per day.  Visit Diagnosis:    ICD-10-CM   1. Schizoaffective disorder, bipolar type (HCC)  F25.0 escitalopram (LEXAPRO) 20 MG tablet    risperiDONE (RISPERDAL) 1 MG tablet    risperiDONE (RISPERDAL) 2 MG tablet    DISCONTINUED: risperiDONE (RISPERDAL) 1 MG tablet    DISCONTINUED: risperiDONE (RISPERDAL) 2 MG tablet    DISCONTINUED: escitalopram (LEXAPRO) 20 MG tablet    2. Insomnia due to other mental disorder  F51.05 escitalopram (LEXAPRO) 20 MG tablet   F99  DISCONTINUED: escitalopram (LEXAPRO) 20 MG tablet    3. PTSD (post-traumatic stress disorder)  F43.10 escitalopram (LEXAPRO) 20 MG tablet    DISCONTINUED: escitalopram (LEXAPRO) 20 MG tablet    4. Panic attacks  F41.0 escitalopram (LEXAPRO) 20 MG tablet    clonazePAM  (KLONOPIN) 1 MG tablet    DISCONTINUED: escitalopram (LEXAPRO) 20 MG tablet    5. Attention deficit hyperactivity disorder (ADHD), unspecified ADHD type  F90.9 DISCONTINUED: guanFACINE (TENEX) 2 MG tablet      Past Psychiatric History:  Schizoaffective, bipolar type Insomnia Anxiety PTSD Panic attacks Attention deficit hyperactivity disorder  Past Medical History:  Past Medical History:  Diagnosis Date   Alcohol abuse    Anxiety    Arthritis    Bipolar disorder (HCC)    Depression    GERD (gastroesophageal reflux disease)    H/O hiatal hernia    Hemorrhoids    Small bowel obstruction (HCC) 2012   Wolf-Parkinson-White syndrome    Wolf-Parkinson-White syndrome    per patient    Past Surgical History:  Procedure Laterality Date   ESOPHAGOGASTRODUODENOSCOPY  06/07/2011   Procedure: ESOPHAGOGASTRODUODENOSCOPY (EGD);  Surgeon: Rob Buntinganiel Jacobs, MD;  [facility];  Service: Endoscopy;  Laterality: N/A;   TUBAL LIGATION      Family Psychiatric History:  Grandfather's sister has schizophrenia Grandmother's brother has a mental illness of some sort  Family History:  Family History  Problem Relation Age of Onset   Breast cancer Maternal Aunt    Colon polyps Maternal Grandmother    Diabetes Maternal Grandmother    Heart attack Maternal Grandmother    Kidney cancer Maternal Grandmother    Breast cancer Maternal Grandmother    Colon cancer Neg Hx    Anesthesia problems Neg Hx    Hypotension Neg Hx    Malignant hyperthermia Neg Hx    Pseudochol deficiency Neg Hx    Heart disease Father    Heart disease Paternal Grandfather    Heart disease Paternal Grandmother     Social History:  Social History   Socioeconomic History   Marital status: Single    Spouse name: Not on file   Number of children: 4   Years of education: Not on file   Highest education level: Not on file  Occupational History   Occupation: house wife  Tobacco Use   Smoking status: Every Day     Packs/day: 1.00    Types: Cigarettes   Smokeless tobacco: Never  Substance and Sexual Activity   Alcohol use: No    Comment: previous alcohol abuse but for last 2 weeks 2 drinks. not currently drinking alcohol   Drug use: No   Sexual activity: Not Currently    Birth control/protection: None  Other Topics Concern   Not on file  Social History Narrative   Not on file   Social Determinants of Health   Financial Resource Strain: Not on file  Food Insecurity: Not on file  Transportation Needs: Not on file  Physical Activity: Not on file  Stress: Not on file  Social Connections: Not on file    Allergies:  Allergies  Allergen Reactions   Acetaminophen Hives, Nausea Only and Rash   Codeine Nausea Only    Metabolic Disorder Labs: No results found for: HGBA1C, MPG No results found for: PROLACTIN Lab Results  Component Value Date   CHOL  01/03/2009    147        ATP III CLASSIFICATION:  <200     mg/dL  Desirable  200-239  mg/dL   Borderline High  >=937    mg/dL   High          TRIG 169 01/03/2009   HDL 19 (L) 01/03/2009   CHOLHDL 7.7 01/03/2009   VLDL 28 01/03/2009   LDLCALC (H) 01/03/2009    100        Total Cholesterol/HDL:CHD Risk Coronary Heart Disease Risk Table                     Men   Women  1/2 Average Risk   3.4   3.3  Average Risk       5.0   4.4  2 X Average Risk   9.6   7.1  3 X Average Risk  23.4   11.0        Use the calculated Patient Ratio above and the CHD Risk Table to determine the patient's CHD Risk.        ATP III CLASSIFICATION (LDL):  <100     mg/dL   Optimal  678-938  mg/dL   Near or Above                    Optimal  130-159  mg/dL   Borderline  101-751  mg/dL   High  >025     mg/dL   Very High   Lab Results  Component Value Date   TSH 3.259 Test methodology is 3rd generation TSH 01/03/2009    Therapeutic Level Labs: No results found for: LITHIUM No results found for: VALPROATE No components found for:  CBMZ  Current  Medications: Current Outpatient Medications  Medication Sig Dispense Refill   [START ON 08/13/2021] clonazePAM (KLONOPIN) 1 MG tablet Take 1 tablet (1 mg total) by mouth 2 (two) times daily as needed for anxiety. 60 tablet 1   escitalopram (LEXAPRO) 20 MG tablet Take 2 tablets (40 mg total) by mouth daily. 60 tablet 2   guanFACINE (TENEX) 2 MG tablet Take 1 tablet (2 mg total) by mouth at bedtime. 30 tablet 1   nicotine (NICODERM CQ - DOSED IN MG/24 HOURS) 21 mg/24hr patch Place 1 patch (21 mg total) onto the skin daily. 28 patch 0   risperiDONE (RISPERDAL) 1 MG tablet Take 1 tablet (1 mg total) by mouth daily. 30 tablet 2   risperiDONE (RISPERDAL) 2 MG tablet Take 1 tablet (2 mg total) by mouth at bedtime. 30 tablet 2   thiamine 100 MG tablet Take 1 tablet (100 mg total) by mouth daily. 30 tablet 0   No current facility-administered medications for this visit.     Musculoskeletal: Strength & Muscle Tone: Unable to assess due to telemedicine visit Gait & Station: Unable to assess due to telemedicine visit Patient leans: Unable to assess due to telemedicine visit  Psychiatric Specialty Exam: Review of Systems  Psychiatric/Behavioral:  Positive for decreased concentration and sleep disturbance. Negative for dysphoric mood, hallucinations, self-injury and suicidal ideas. The patient is nervous/anxious. The patient is not hyperactive.    There were no vitals taken for this visit.There is no height or weight on file to calculate BMI.  General Appearance: Unable to assess due to telemedicine visit  Eye Contact:  Unable to assess due to telemedicine visit  Speech:  Clear and Coherent and Normal Rate  Volume:  Normal  Mood:  Anxious, Depressed, and Irritable  Affect:  Congruent and Depressed  Thought Process:  Coherent, Goal Directed, and Descriptions of Associations: Intact  Orientation:  Full (Time, Place, and Person)  Thought Content: WDL   Suicidal Thoughts:  No  Homicidal Thoughts:  No   Memory:  Immediate;   Good Recent;   Fair Remote;   Fair  Judgement:  Fair  Insight:  Fair  Psychomotor Activity:  Normal  Concentration:  Concentration: Good and Attention Span: Good  Recall:  Good  Fund of Knowledge: Good  Language: Good  Akathisia:  NA  Handed:  Right  AIMS (if indicated): not done  Assets:  Communication Skills Desire for Improvement Housing  ADL's:  Intact  Cognition: WNL  Sleep:  Poor   Screenings: GAD-7    Flowsheet Row [facility]  Total GAD-7 Score 17 20 21 13 12       PHQ2-9    Flowsheet Row [facility]  PHQ-2 Total Score 2 2 4 1 1   PHQ-9 Total Score 17 16 15  -- --      Flowsheet Row [facility] (Discharged) [facility]  SURGICAL  C-SSRS RISK CATEGORY Low Risk Low Risk No Risk        Assessment and Plan:   Ashley FortsStacy Berg is a 51 year old female with a past psychiatric history significant for attention deficit hyperactivity disorder, insomnia, PTSD, schizoaffective disorder (bipolar type), and panic attacks who [facility]  virtual telephone visit for follow-up and medication management.  Patient reports that she has discontinued taking guanfacine due to the medication not being effective in managing her symptoms related to ADHD.  Provider recommended increasing her dosage of guanfacine from 1 mg to 2 mg at bedtime.  Patient denies any other issues with her other medications.  Patient to continue taking medications as prescribed.  Patient's medications to be e-prescribed to pharmacy of choice.  1. Schizoaffective disorder, bipolar type (HCC)  - escitalopram (LEXAPRO) 20 MG tablet; Take 2 tablets (40 mg total) by mouth daily.  Dispense: 60 tablet; Refill: 2 - risperiDONE (RISPERDAL) 1 MG tablet; Take 1 tablet (1 mg total) by mouth daily.  Dispense: 30 tablet; Refill: 2 - risperiDONE (RISPERDAL) 2 MG tablet; Take 1 tablet (2 mg total) by mouth at bedtime.  Dispense: 30 tablet; Refill: 2  2. Insomnia due to other mental disorder  - escitalopram (LEXAPRO) 20 MG tablet; Take 2 tablets (40 mg total) by mouth daily.  Dispense: 60 tablet; Refill: 2  3. PTSD (post-traumatic stress disorder)  - escitalopram (LEXAPRO) 20 MG tablet; Take 2 tablets (40 mg total) by mouth daily.  Dispense: 60 tablet; Refill: 2  4. Panic attacks  - escitalopram (LEXAPRO) 20 MG tablet; Take 2 tablets (40 mg total) by mouth daily.  Dispense: 60 tablet; Refill: 2 - clonazePAM (KLONOPIN) 1 MG tablet; Take 1 tablet (1 mg total) by mouth 2 (  two) times daily as needed for anxiety.  Dispense: 60 tablet; Refill: 1  5. Attention deficit hyperactivity disorder (ADHD), unspecified ADHD type Patient to continue taking guanfacine 2 mg at bedtime for the management of her ADHD  Patient to follow up in 2 months Provider spent a total of 19 minutes with the patient/reviewing patient's chart  Meta Hatchet, PA 08/10/2021, 8:21 PM

## 2021-08-11 ENCOUNTER — Other Ambulatory Visit: Payer: Self-pay

## 2021-08-14 ENCOUNTER — Other Ambulatory Visit: Payer: Self-pay

## 2021-08-21 ENCOUNTER — Other Ambulatory Visit (HOSPITAL_COMMUNITY): Payer: Self-pay | Admitting: Physician Assistant

## 2021-08-21 DIAGNOSIS — F410 Panic disorder [episodic paroxysmal anxiety] without agoraphobia: Secondary | ICD-10-CM

## 2021-08-21 DIAGNOSIS — F99 Mental disorder, not otherwise specified: Secondary | ICD-10-CM

## 2021-08-21 DIAGNOSIS — F250 Schizoaffective disorder, bipolar type: Secondary | ICD-10-CM

## 2021-08-21 DIAGNOSIS — F4310 Post-traumatic stress disorder, unspecified: Secondary | ICD-10-CM

## 2021-08-21 DIAGNOSIS — F5105 Insomnia due to other mental disorder: Secondary | ICD-10-CM

## 2021-08-25 ENCOUNTER — Other Ambulatory Visit: Payer: Self-pay

## 2021-08-31 ENCOUNTER — Telehealth (HOSPITAL_COMMUNITY): Payer: Self-pay

## 2021-08-31 ENCOUNTER — Other Ambulatory Visit: Payer: Self-pay

## 2021-08-31 NOTE — Telephone Encounter (Signed)
RECEIVED A FAX FROM [facility] ON [address] REQUESTING A PA ON PATIENT'S LEXAPRO 20MG  TABLET [employer]  SENT FOR REVIEW WAITING ON DETERMINATION

## 2021-09-19 ENCOUNTER — Other Ambulatory Visit (HOSPITAL_COMMUNITY): Payer: Self-pay | Admitting: Physician Assistant

## 2021-09-19 ENCOUNTER — Telehealth (HOSPITAL_COMMUNITY): Payer: Self-pay

## 2021-09-19 ENCOUNTER — Other Ambulatory Visit: Payer: Self-pay

## 2021-09-19 DIAGNOSIS — F410 Panic disorder [episodic paroxysmal anxiety] without agoraphobia: Secondary | ICD-10-CM

## 2021-09-19 DIAGNOSIS — F250 Schizoaffective disorder, bipolar type: Secondary | ICD-10-CM

## 2021-09-19 DIAGNOSIS — F4310 Post-traumatic stress disorder, unspecified: Secondary | ICD-10-CM

## 2021-09-19 DIAGNOSIS — F5105 Insomnia due to other mental disorder: Secondary | ICD-10-CM

## 2021-09-19 NOTE — Telephone Encounter (Signed)
Writer called patient's  pharmacy, [facility] on [address] in [place], to followup on: PA DONE FOR PT'S  Escitalopram (Lexapro) 20mg  tablet SENT FOR REVIEW ON 08/31/21 Writer s/w pharmacist and she ran pt's Escitalopram (Lexapro) 20mg . It went through.  PD CLAIM AS OF 09/19/21

## 2021-09-22 ENCOUNTER — Other Ambulatory Visit: Payer: Self-pay

## 2021-09-25 ENCOUNTER — Other Ambulatory Visit: Payer: Self-pay

## 2021-09-25 MED FILL — Escitalopram Oxalate Tab 20 MG (Base Equiv): ORAL | 30 days supply | Qty: 60 | Fill #0 | Status: CN

## 2021-09-26 ENCOUNTER — Other Ambulatory Visit: Payer: Self-pay

## 2021-09-27 ENCOUNTER — Other Ambulatory Visit: Payer: Self-pay

## 2021-09-27 ENCOUNTER — Telehealth (HOSPITAL_COMMUNITY): Payer: Self-pay | Admitting: *Deleted

## 2021-09-27 NOTE — Telephone Encounter (Signed)
Lexapro approved via PA, her pharmacy notified and it was able to be run thru to be filled. She was notified to pick her med up. ?

## 2021-10-03 ENCOUNTER — Telehealth (HOSPITAL_COMMUNITY): Payer: Self-pay | Admitting: *Deleted

## 2021-10-03 NOTE — Telephone Encounter (Signed)
[employer] HAS APPROVED : escitalopram (LEXAPRO) 20 MG tablet  ? ?EFFECTIVE: 09/26/2021  TO  09/28/2022 ?

## 2021-10-04 ENCOUNTER — Encounter (HOSPITAL_COMMUNITY): Payer: Self-pay | Admitting: Physician Assistant

## 2021-10-04 ENCOUNTER — Telehealth (INDEPENDENT_AMBULATORY_CARE_PROVIDER_SITE_OTHER): Payer: No Payment, Other | Admitting: Physician Assistant

## 2021-10-04 DIAGNOSIS — F909 Attention-deficit hyperactivity disorder, unspecified type: Secondary | ICD-10-CM

## 2021-10-04 DIAGNOSIS — F5105 Insomnia due to other mental disorder: Secondary | ICD-10-CM

## 2021-10-04 DIAGNOSIS — F99 Mental disorder, not otherwise specified: Secondary | ICD-10-CM

## 2021-10-04 DIAGNOSIS — F410 Panic disorder [episodic paroxysmal anxiety] without agoraphobia: Secondary | ICD-10-CM

## 2021-10-04 DIAGNOSIS — F250 Schizoaffective disorder, bipolar type: Secondary | ICD-10-CM

## 2021-10-04 DIAGNOSIS — F4310 Post-traumatic stress disorder, unspecified: Secondary | ICD-10-CM

## 2021-10-04 MED ORDER — RISPERIDONE 1 MG PO TABS: 1.0000 mg | ORAL_TABLET | Freq: Every day | ORAL | 2 refills | Status: DC

## 2021-10-04 MED ORDER — GUANFACINE HCL 2 MG PO TABS: 2.0000 mg | ORAL_TABLET | Freq: Every day | ORAL | 1 refills | Status: DC

## 2021-10-04 MED ORDER — ESCITALOPRAM OXALATE 20 MG PO TABS: 20.0000 mg | ORAL_TABLET | Freq: Every day | ORAL | 1 refills | Status: DC

## 2021-10-04 MED ORDER — RISPERIDONE 2 MG PO TABS: 2.0000 mg | ORAL_TABLET | Freq: Every day | ORAL | 2 refills | Status: DC

## 2021-10-04 NOTE — Progress Notes (Cosign Needed)
BH MD/PA/NP OP Progress Note  Virtual Visit via Telephone Note  I connected with [name] on 10/04/21 at  3:00 PM EST by telephone and verified that I am speaking with the correct person using two identifiers.  Location: Patient: Home Provider: Clinic   I discussed the limitations, risks, security and privacy concerns of performing an evaluation and management service by telephone and the availability of in person appointments. I also discussed with the patient that there may be a patient responsible charge related to this service. The patient expressed understanding and agreed to proceed.  Follow Up Instructions:   I discussed the assessment and treatment plan with the patient. The patient was provided an opportunity to ask questions and all were answered. The patient agreed with the plan and demonstrated an understanding of the instructions.   The patient was advised to call back or seek an in-person evaluation if the symptoms worsen or if the condition fails to improve as anticipated.  I provided 12 minutes of non-face-to-face time during this encounter.  Meta Hatchet, PA   10/04/2021 9:07 PM [name]  MRN:  [id number]  Chief Complaint:  Chief Complaint  Patient presents with   Follow-up   Medication Management   HPI:   [name]  Visit Diagnosis:    ICD-10-CM   1. Schizoaffective disorder, bipolar type (HCC)  F25.0 risperiDONE (RISPERDAL) 1 MG tablet    risperiDONE (RISPERDAL) 2 MG tablet    escitalopram (LEXAPRO) 20 MG tablet    2. Attention deficit hyperactivity disorder (ADHD), unspecified ADHD type  F90.9 guanFACINE (TENEX) 2 MG tablet    3. Insomnia due to other mental disorder  F51.05 escitalopram (LEXAPRO) 20 MG tablet   F99     4. PTSD (post-traumatic stress disorder)  F43.10 escitalopram (LEXAPRO) 20 MG tablet    5. Panic attacks  F41.0 escitalopram (LEXAPRO) 20 MG tablet      Past Psychiatric History:  Schizoaffective, bipolar  type Insomnia Anxiety PTSD Panic attacks Attention deficit hyperactivity disorder  Past Medical History:  Past Medical History:  Diagnosis Date   Alcohol abuse    Anxiety    Arthritis    Bipolar disorder (HCC)    Depression    GERD (gastroesophageal reflux disease)    H/O hiatal hernia    Hemorrhoids    Small bowel obstruction (HCC) 2012   Wolf-Parkinson-White syndrome    Wolf-Parkinson-White syndrome    per patient    Past Surgical History:  Procedure Laterality Date   ESOPHAGOGASTRODUODENOSCOPY  06/07/2011   Procedure: ESOPHAGOGASTRODUODENOSCOPY (EGD);  Surgeon: Rob Bunting, MD;  Location: [facility];  Service: Endoscopy;  Laterality: N/A;   TUBAL LIGATION      Family Psychiatric History:  Grandfather's sister has schizophrenia Grandmother's brother has a mental illness of some sort  Family History:  Family History  Problem Relation Age of Onset   Breast cancer Maternal Aunt    Colon polyps Maternal Grandmother    Diabetes Maternal Grandmother    Heart attack Maternal Grandmother    Kidney cancer Maternal Grandmother    Breast cancer Maternal Grandmother    Colon cancer Neg Hx    Anesthesia problems Neg Hx    Hypotension Neg Hx    Malignant hyperthermia Neg Hx    Pseudochol deficiency Neg Hx    Heart disease Father    Heart disease Paternal Grandfather    Heart disease Paternal Grandmother     Social History:  Social History   Socioeconomic History  Marital status: Single    Spouse name: Not on file   Number of children: 4   Years of education: Not on file   Highest education level: Not on file  Occupational History   Occupation: house wife  Tobacco Use   Smoking status: Every Day    Packs/day: 1.00    Types: Cigarettes   Smokeless tobacco: Never  Substance and Sexual Activity   Alcohol use: No    Comment: previous alcohol abuse but for last 2 weeks 2 drinks. not currently drinking alcohol   Drug use: No   Sexual activity: Not Currently     Birth control/protection: None  Other Topics Concern   Not on file  Social History Narrative   Not on file   Social Determinants of Health   Financial Resource Strain: Not on file  Food Insecurity: Not on file  Transportation Needs: Not on file  Physical Activity: Not on file  Stress: Not on file  Social Connections: Not on file    Allergies:  Allergies  Allergen Reactions   Acetaminophen Hives, Nausea Only and Rash   Codeine Nausea Only    Metabolic Disorder Labs: No results found for: HGBA1C, MPG No results found for: PROLACTIN Lab Results  Component Value Date   CHOL  01/03/2009    147        ATP III CLASSIFICATION:  <200     mg/dL   Desirable  196-222  mg/dL   Borderline High  >=979    mg/dL   High          TRIG 892 01/03/2009   HDL 19 (L) 01/03/2009   CHOLHDL 7.7 01/03/2009   VLDL 28 01/03/2009   LDLCALC (H) 01/03/2009    100        Total Cholesterol/HDL:CHD Risk Coronary Heart Disease Risk Table                     Men   Women  1/2 Average Risk   3.4   3.3  Average Risk       5.0   4.4  2 X Average Risk   9.6   7.1  3 X Average Risk  23.4   11.0        Use the calculated Patient Ratio above and the CHD Risk Table to determine the patient's CHD Risk.        ATP III CLASSIFICATION (LDL):  <100     mg/dL   Optimal  119-417  mg/dL   Near or Above                    Optimal  130-159  mg/dL   Borderline  408-144  mg/dL   High  >818     mg/dL   Very High   Lab Results  Component Value Date   TSH 3.259 Test methodology is 3rd generation TSH 01/03/2009    Therapeutic Level Labs: No results found for: LITHIUM No results found for: VALPROATE No components found for:  CBMZ  Current Medications: Current Outpatient Medications  Medication Sig Dispense Refill   clonazePAM (KLONOPIN) 1 MG tablet Take 1 tablet (1 mg total) by mouth 2 (two) times daily as needed for anxiety. 60 tablet 1   escitalopram (LEXAPRO) 20 MG tablet Take 1 tablet (20 mg  total) by mouth daily. 90 tablet 1   guanFACINE (TENEX) 2 MG tablet Take 1 tablet (2 mg total) by mouth at bedtime. 30 tablet 1  nicotine (NICODERM CQ - DOSED IN MG/24 HOURS) 21 mg/24hr patch Place 1 patch (21 mg total) onto the skin daily. 28 patch 0   risperiDONE (RISPERDAL) 1 MG tablet Take 1 tablet (1 mg total) by mouth daily. 30 tablet 2   risperiDONE (RISPERDAL) 2 MG tablet Take 1 tablet (2 mg total) by mouth at bedtime. 30 tablet 2   thiamine 100 MG tablet Take 1 tablet (100 mg total) by mouth daily. 30 tablet 0   No current facility-administered medications for this visit.     Musculoskeletal: Strength & Muscle Tone: Unable to assess due to telemedicine visit Gait & Station: Unable to assess due to telemedicine visit Patient leans: Unable to assess due to telemedicine visit  Psychiatric Specialty Exam: Review of Systems  Psychiatric/Behavioral:  Positive for sleep disturbance. Negative for decreased concentration, dysphoric mood, hallucinations, self-injury and suicidal ideas. The patient is nervous/anxious. The patient is not hyperactive.    There were no vitals taken for this visit.There is no height or weight on file to calculate BMI.  General Appearance: Unable to assess due to telemedicine visit  Eye Contact:  Unable to assess due to telemedicine visit  Speech:  Clear and Coherent and Normal Rate  Volume:  Normal  Mood:  Anxious and Euthymic  Affect:  Appropriate and Congruent  Thought Process:  Coherent and Descriptions of Associations: Intact  Orientation:  Full (Time, Place, and Person)  Thought Content: WDL   Suicidal Thoughts:  No  Homicidal Thoughts:  No  Memory:  Immediate;   Good Recent;   Fair Remote;   Fair  Judgement:  Fair  Insight:  Fair  Psychomotor Activity:  Normal  Concentration:  Concentration: Good and Attention Span: Good  Recall:  Good  Fund of Knowledge: Good  Language: Good  Akathisia:  No  Handed:  Right  AIMS (if indicated): not done   Assets:  Communication Skills Desire for Improvement Housing  ADL's:  Intact  Cognition: WNL  Sleep:  Poor   Screenings: GAD-7    Flowsheet Row [facility]  Total GAD-7 Score 16 17 20 21 13       PHQ2-9    Flowsheet Row [facility]  PHQ-2 Total Score 2 2 2 4 1   PHQ-9 Total Score 10 17 16 15  --      Flowsheet Row [facility]  C-SSRS RISK CATEGORY Low Risk Low Risk Low Risk        Assessment and Plan:     Collaboration of Care: Collaboration of Care: Medication Management AEB provider managing patient's psychiatric medications and Psychiatrist AEB patient being followed by a mental health provider.  Patient/Guardian was advised Release of Information must be obtained prior to any record release in order to collaborate their care with an outside  provider. Patient/Guardian was advised if they have not already done so to contact the registration department to sign all necessary forms in order for [place] to release information regarding their care.   Consent: Patient/Guardian gives verbal consent for treatment and  assignment of benefits for services provided during this visit. Patient/Guardian expressed understanding and agreed to proceed.   1. Schizoaffective disorder, bipolar type (HCC)  - risperiDONE (RISPERDAL) 1 MG tablet; Take 1 tablet (1 mg total) by mouth daily.  Dispense: 30 tablet; Refill: 2 - risperiDONE (RISPERDAL) 2 MG tablet; Take 1 tablet (2 mg total) by mouth at bedtime.  Dispense: 30 tablet; Refill: 2 - escitalopram (LEXAPRO) 20 MG tablet; Take 1 tablet (20 mg total) by mouth daily.  Dispense: 90 tablet; Refill: 1  2. Attention deficit hyperactivity disorder (ADHD), unspecified ADHD type  - guanFACINE (TENEX) 2 MG tablet; Take 1 tablet (2 mg total) by mouth at bedtime.  Dispense: 30 tablet; Refill: 1  3. Insomnia due to other mental disorder  - escitalopram (LEXAPRO) 20 MG tablet; Take 1 tablet (20 mg total) by mouth daily.  Dispense: 90 tablet; Refill: 1  4. PTSD (post-traumatic stress disorder)  - escitalopram (LEXAPRO) 20 MG tablet; Take 1 tablet (20 mg total) by mouth daily.  Dispense: 90 tablet; Refill: 1  5. Panic attacks  - escitalopram (LEXAPRO) 20 MG tablet; Take 1 tablet (20 mg total) by mouth daily.  Dispense: 90 tablet; Refill: 1  Patient to follow up in 2 months Provider spent a total of 12 minutes with the patient/reviewing patient's chart  Meta Hatchet, PA 10/04/2021, 9:07 PM

## 2021-10-15 ENCOUNTER — Other Ambulatory Visit (HOSPITAL_COMMUNITY): Payer: Self-pay | Admitting: Physician Assistant

## 2021-10-15 DIAGNOSIS — F410 Panic disorder [episodic paroxysmal anxiety] without agoraphobia: Secondary | ICD-10-CM

## 2021-10-16 ENCOUNTER — Telehealth (HOSPITAL_COMMUNITY): Payer: Self-pay

## 2021-10-16 NOTE — Telephone Encounter (Signed)
Provider was contacted by Alinda Money, CMA regarding patient's medication refill request. Provider to e-prescribe patient's medication to pharmacy of choice.

## 2021-10-16 NOTE — Telephone Encounter (Signed)
Patient called requesting a refill on her Clonazepam 1mg  to be sent to [facility] on [address] in [place]. Please review and advise. Thank you ?

## 2021-10-17 NOTE — Telephone Encounter (Signed)
Discussed message regarding patient's escitalopram dosage.  Patient to remain on 20 mg daily for the management of her symptoms.

## 2021-10-20 NOTE — Telephone Encounter (Signed)
NOTIFIED PATIENT °

## 2021-10-31 ENCOUNTER — Telehealth (HOSPITAL_COMMUNITY): Payer: Self-pay | Admitting: *Deleted

## 2021-10-31 ENCOUNTER — Other Ambulatory Visit: Payer: Self-pay

## 2021-10-31 ENCOUNTER — Other Ambulatory Visit (HOSPITAL_COMMUNITY): Payer: Self-pay | Admitting: Physician Assistant

## 2021-10-31 DIAGNOSIS — F250 Schizoaffective disorder, bipolar type: Secondary | ICD-10-CM

## 2021-10-31 NOTE — Telephone Encounter (Signed)
Call from patient requesting her medicines be moved to [facility] pharmacy as she no longer has insurance. Will notify Eddie PA and ask them to be moved, one of the meds is Klonopin and cant be transferred. She also wants to talk to Link Snuffer re some medicines. ?

## 2021-10-31 NOTE — Telephone Encounter (Signed)
Provider was contacted by Glory Buff. Olevia Bowens, RN and informed that patient's medications, except for clonazepam, will now be sent to [facility]. Provider to reach out to patient to discuss medications.

## 2021-11-01 ENCOUNTER — Other Ambulatory Visit: Payer: Self-pay

## 2021-11-01 ENCOUNTER — Other Ambulatory Visit (HOSPITAL_COMMUNITY): Payer: Self-pay | Admitting: Physician Assistant

## 2021-11-01 ENCOUNTER — Telehealth (HOSPITAL_COMMUNITY): Payer: Self-pay | Admitting: *Deleted

## 2021-11-01 DIAGNOSIS — F410 Panic disorder [episodic paroxysmal anxiety] without agoraphobia: Secondary | ICD-10-CM

## 2021-11-01 DIAGNOSIS — F250 Schizoaffective disorder, bipolar type: Secondary | ICD-10-CM

## 2021-11-01 DIAGNOSIS — F99 Mental disorder, not otherwise specified: Secondary | ICD-10-CM

## 2021-11-01 DIAGNOSIS — F4310 Post-traumatic stress disorder, unspecified: Secondary | ICD-10-CM

## 2021-11-01 DIAGNOSIS — F5105 Insomnia due to other mental disorder: Secondary | ICD-10-CM

## 2021-11-01 MED ORDER — ESCITALOPRAM OXALATE 20 MG PO TABS
20.0000 mg | ORAL_TABLET | Freq: Every day | ORAL | 1 refills | Status: DC
  Filled 2021-11-01: qty 30, 30d supply, fill #0

## 2021-11-01 MED ORDER — RISPERIDONE 1 MG PO TABS
1.0000 mg | ORAL_TABLET | Freq: Every day | ORAL | 2 refills | Status: DC
  Filled 2021-11-01: qty 30, 30d supply, fill #0
  Filled 2021-12-04: qty 30, 30d supply, fill #1
  Filled 2021-12-04: qty 30, 30d supply, fill #0
  Filled 2022-01-05: qty 30, 30d supply, fill #1

## 2021-11-01 MED ORDER — RISPERIDONE 2 MG PO TABS
2.0000 mg | ORAL_TABLET | Freq: Every day | ORAL | 2 refills | Status: DC
  Filled 2021-11-01: qty 30, 30d supply, fill #0
  Filled 2021-12-04: qty 30, 30d supply, fill #1
  Filled 2021-12-04: qty 30, 30d supply, fill #0
  Filled 2022-01-05: qty 30, 30d supply, fill #1

## 2021-11-01 NOTE — Progress Notes (Signed)
Due to patient having no insurance, patient's medications will now be filled at [facility]. ?

## 2021-11-01 NOTE — Telephone Encounter (Signed)
[facility] to confirm her rx as she says she needs them all at [facility] as she no longer has insurance. I tried to explain to her that she had never filled the Risperdal so to get it from [facility] she would have to call [facility] and ask them to have [facility]. She spoke over me, was arguementative and said that it was not her job to do. She continued to tell me she did not [facility] but would not listen to me and I ended the conversation.  ?

## 2021-11-03 ENCOUNTER — Telehealth (HOSPITAL_COMMUNITY): Payer: Self-pay | Admitting: *Deleted

## 2021-11-03 ENCOUNTER — Telehealth (HOSPITAL_COMMUNITY): Payer: Self-pay | Admitting: Physician Assistant

## 2021-11-03 NOTE — Telephone Encounter (Signed)
Patient requesting sleep aid medication. Tazadone 300 mg worked befoe but if there is something different pt wants to talk to provider about making decision for something different.  Call pt [phone]

## 2021-11-03 NOTE — Telephone Encounter (Signed)
Patient left a VM on writers line that she called early this week about her medicine but never got a call back from me. We did speak earlier this week, note is in chart and it was non productive because she would not let me speak and argued with what little I was able to say. Per chart she spoke with Albin Felling this am at the front desk and wants a medication addition of Trazadone. Will forward this request to Worley PA who is her provider.  ?

## 2021-11-20 ENCOUNTER — Other Ambulatory Visit (HOSPITAL_COMMUNITY): Payer: Self-pay | Admitting: Physician Assistant

## 2021-11-20 ENCOUNTER — Other Ambulatory Visit: Payer: Self-pay

## 2021-11-20 ENCOUNTER — Telehealth (HOSPITAL_COMMUNITY): Payer: Self-pay | Admitting: Physician Assistant

## 2021-11-20 DIAGNOSIS — F5105 Insomnia due to other mental disorder: Secondary | ICD-10-CM

## 2021-11-20 MED ORDER — TRAZODONE HCL 150 MG PO TABS
150.0000 mg | ORAL_TABLET | Freq: Every day | ORAL | 1 refills | Status: DC
  Filled 2021-11-20: qty 30, 30d supply, fill #0

## 2021-11-20 NOTE — Telephone Encounter (Signed)
Message acknowledged and reviewed.

## 2021-11-20 NOTE — Telephone Encounter (Signed)
Patient states she has been calling the office for two weeks to speak with provider. She has left several messages, documented since 3/20. Patient states provider has not called. Writer informed this message will be resent to provider and supervisor, unable to give call back timeframe. Patient also reminded of appt on 4/26.  ?

## 2021-11-20 NOTE — Progress Notes (Signed)
Provider was able to reach out to patient regarding patient's medication. Patient reports that she has been on Trazodone in the past and would like to be placed back on the medication. Patient to be placed on Trazodone 150 mg at bedtime for the management of her sleep disturbances. Patient was informed to take half a tablet (75 mg total) if too potent or double up on the medication if she is still having issues with sleep. Patient vocalized understanding. Patient's medication to be e-prescribed to pharmacy of choice. ?

## 2021-11-20 NOTE — Telephone Encounter (Signed)
Provider was able to reach out to patient. Patient complains of issues with sleep. Patient was once taking trazodone 300 mg at bedtime before being discontinued. Patient to be placed on Trazodone 150 mg at bedtime for the management of her sleep. Patient was advised to take half tablet (75 mg total) if too potent or double dosage if the medication is not effective in managing her sleep issues. Patient's medication to be e-prescribed to pharmacy of choice.

## 2021-11-21 ENCOUNTER — Other Ambulatory Visit: Payer: Self-pay

## 2021-11-22 ENCOUNTER — Telehealth (HOSPITAL_COMMUNITY): Payer: No Payment, Other | Admitting: Physician Assistant

## 2021-11-29 ENCOUNTER — Telehealth (HOSPITAL_COMMUNITY): Payer: Self-pay | Admitting: *Deleted

## 2021-11-29 NOTE — Telephone Encounter (Signed)
Call from S[place] asking for Eddie PA to call her as soon as possible and to tell him she needs her Klonopin, Trazodone and Lexapro to be 90 days and to call them to [facility] on [address] per her new insurance. I called [facility], they do not have insurance on file for her and have never filled for her so they could not be specific with answers but states insurance companies due dictate the pharmacies per certain policies but cant dictate 90 days esp with controlled drugs ie Klonopin. Will forward patients request and ask him to call her. She wants her Risperdal to continue to go to [facility].  ?

## 2021-11-30 ENCOUNTER — Telehealth (HOSPITAL_COMMUNITY): Payer: Self-pay | Admitting: *Deleted

## 2021-11-30 ENCOUNTER — Other Ambulatory Visit (HOSPITAL_COMMUNITY): Payer: Self-pay | Admitting: Physician Assistant

## 2021-11-30 ENCOUNTER — Other Ambulatory Visit: Payer: Self-pay

## 2021-11-30 DIAGNOSIS — F4310 Post-traumatic stress disorder, unspecified: Secondary | ICD-10-CM

## 2021-11-30 DIAGNOSIS — F5105 Insomnia due to other mental disorder: Secondary | ICD-10-CM

## 2021-11-30 DIAGNOSIS — F250 Schizoaffective disorder, bipolar type: Secondary | ICD-10-CM

## 2021-11-30 DIAGNOSIS — F410 Panic disorder [episodic paroxysmal anxiety] without agoraphobia: Secondary | ICD-10-CM

## 2021-11-30 MED ORDER — ESCITALOPRAM OXALATE 20 MG PO TABS: 40.0000 mg | ORAL_TABLET | Freq: Every day | ORAL | 1 refills | Status: DC

## 2021-11-30 MED ORDER — TRAZODONE HCL 300 MG PO TABS: 300.0000 mg | ORAL_TABLET | Freq: Every day | ORAL | 1 refills | Status: DC

## 2021-11-30 MED ORDER — ESCITALOPRAM OXALATE 20 MG PO TABS: 20.0000 mg | ORAL_TABLET | Freq: Every day | ORAL | 1 refills | Status: DC

## 2021-11-30 MED ORDER — CLONAZEPAM 1 MG PO TABS: 1.0000 mg | ORAL_TABLET | Freq: Two times a day (BID) | ORAL | 2 refills | Status: DC | PRN

## 2021-11-30 NOTE — Telephone Encounter (Signed)
Message acknowledged and reviewed.

## 2021-11-30 NOTE — Telephone Encounter (Signed)
Provider was contacted by Glory Buff. Olevia Bowens, RN regarding patient's request for medications to be sent to different pharmacy due to recently obtaining health insurance.  Patient's medication to be e-prescribed to pharmacy of choice.

## 2021-11-30 NOTE — Progress Notes (Signed)
Patient informed provider that she just recently changed policy plans with her insurance and states that her insurance will now cover Lexapro 40 mg daily.  Patient was at 1 point taking Lexapro 40 mg daily for the management of her anxiety and depressive symptoms.  Provider to fill prescription for Lexapro 40 mg daily.  Patient's medication to be e-prescribed to pharmacy of choice. ?

## 2021-11-30 NOTE — Telephone Encounter (Signed)
Call back today from patient and a VM left for writer stating the rx for Lexapro Eddie PA called in for her today was for 20 mg and she has been taking  40 mg. Will bring this to Eddies attention and see if he is willing to change her rx.  ?

## 2021-11-30 NOTE — Telephone Encounter (Signed)
Writer was contacted by Orpah Clinton. Reola Calkins, RN regarding patient's message over concerns related to her Lexapro dosage.  Provider was able to contact patient.  Patient informed provider that she used to take Lexapro 40 mg daily for the management of her anxiety and depressive symptoms.  Provider informed patient that her insurance company would not fill the prescription due to the dosage and only covered Lexapro 20 mg daily.  Patient informed provider that she recently changed her policy and her insurance should now be able to cover Lexapro 40 mg daily.  Provider place prescription for Lexapro 40 mg daily and e-prescribed a medication to patient's pharmacy of choice.

## 2021-11-30 NOTE — Progress Notes (Signed)
Provider was contacted by Orpah Clinton. Reola Calkins, RN regarding patient's request for medications to be sent to different pharmacy.  Patient is also requesting refills for her trazodone, escitalopram, and clonazepam.  Provider to fill patient's medications according to pharmacy specifications.  Patient's medications to be e-prescribed to pharmacy of choice. ? ?Patient's Risperdal to continue to be sent to [facility] and [facility]. ?

## 2021-12-04 ENCOUNTER — Other Ambulatory Visit (HOSPITAL_COMMUNITY): Payer: Self-pay

## 2021-12-04 ENCOUNTER — Other Ambulatory Visit: Payer: Self-pay

## 2021-12-05 ENCOUNTER — Other Ambulatory Visit (HOSPITAL_COMMUNITY): Payer: Self-pay

## 2021-12-06 ENCOUNTER — Other Ambulatory Visit (HOSPITAL_COMMUNITY): Payer: Self-pay

## 2021-12-07 ENCOUNTER — Other Ambulatory Visit: Payer: Self-pay

## 2021-12-14 ENCOUNTER — Telehealth (HOSPITAL_COMMUNITY): Payer: No Payment, Other | Admitting: Physician Assistant

## 2021-12-15 ENCOUNTER — Encounter: Payer: Self-pay | Admitting: Gastroenterology

## 2022-01-04 ENCOUNTER — Telehealth (INDEPENDENT_AMBULATORY_CARE_PROVIDER_SITE_OTHER): Payer: No Payment, Other | Admitting: Physician Assistant

## 2022-01-04 DIAGNOSIS — F4310 Post-traumatic stress disorder, unspecified: Secondary | ICD-10-CM

## 2022-01-04 DIAGNOSIS — F250 Schizoaffective disorder, bipolar type: Secondary | ICD-10-CM

## 2022-01-04 DIAGNOSIS — F410 Panic disorder [episodic paroxysmal anxiety] without agoraphobia: Secondary | ICD-10-CM

## 2022-01-04 DIAGNOSIS — F99 Mental disorder, not otherwise specified: Secondary | ICD-10-CM

## 2022-01-04 DIAGNOSIS — F5105 Insomnia due to other mental disorder: Secondary | ICD-10-CM

## 2022-01-04 DIAGNOSIS — F909 Attention-deficit hyperactivity disorder, unspecified type: Secondary | ICD-10-CM

## 2022-01-05 ENCOUNTER — Other Ambulatory Visit (HOSPITAL_COMMUNITY): Payer: Self-pay

## 2022-01-05 MED ORDER — RISPERIDONE 1 MG PO TABS: 1.0000 mg | ORAL_TABLET | Freq: Every day | ORAL | 2 refills | Status: DC

## 2022-01-05 MED ORDER — TRAZODONE HCL 150 MG PO TABS: 450.0000 mg | ORAL_TABLET | Freq: Every day | ORAL | 1 refills | Status: DC

## 2022-01-05 MED ORDER — ESCITALOPRAM OXALATE 20 MG PO TABS: 40.0000 mg | ORAL_TABLET | Freq: Every day | ORAL | 1 refills | Status: DC

## 2022-01-05 MED ORDER — RISPERIDONE 2 MG PO TABS: 2.0000 mg | ORAL_TABLET | Freq: Every day | ORAL | 2 refills | Status: DC

## 2022-01-05 MED ORDER — GUANFACINE HCL 2 MG PO TABS: 2.0000 mg | ORAL_TABLET | Freq: Every day | ORAL | 2 refills | Status: DC

## 2022-01-07 ENCOUNTER — Encounter (HOSPITAL_COMMUNITY): Payer: Self-pay | Admitting: Physician Assistant

## 2022-01-07 NOTE — Progress Notes (Addendum)
Olympia Fields MD/PA/NP OP Progress Note  Virtual Visit via Telephone Note  I connected with [name] on 01/07/22 at  4:30 PM EDT by telephone and verified that I am speaking with the correct person using two identifiers.  Location: Patient: Home Provider: Clinic   I discussed the limitations, risks, security and privacy concerns of performing an evaluation and management service by telephone and the availability of in person appointments. I also discussed with the patient that there may be a patient responsible charge related to this service. The patient expressed understanding and agreed to proceed.  Follow Up Instructions:   I discussed the assessment and treatment plan with the patient. The patient was provided an opportunity to ask questions and all were answered. The patient agreed with the plan and demonstrated an understanding of the instructions.   The patient was advised to call back or seek an in-person evaluation if the symptoms worsen or if the condition fails to improve as anticipated.  I provided 21 minutes of non-face-to-face time during this encounter.  Malachy Mood, PA   01/07/2022 10:48 PM [name]  MRN:  [id number]  Chief Complaint:  Chief Complaint  Patient presents with   Follow-up   Medication Management   HPI:   [name] is a 51 year old female with a past psychiatric history significant for attention deficit hyperactivity disorder, insomnia, PTSD, schizoaffective disorder (bipolar type), and panic attacks who presents to [facility] via virtual telephone visit for follow-up and medication management.  Patient is currently being managed on the following medications:  Guanfacine 2 mg at bedtime Escitalopram 40 mg daily Clonazepam 1 mg 2 times daily as needed Risperidone 1 mg daily/2 mg at bedtime Benztropine 0.5 mg 2 times daily Trazodone 300 mg at bedtime  Patient continues to endorse lack of motivation and  impaired thinking that has been going on for 2 weeks.  Patient states that her mental impairment may be due to her lack of sleep.  Patient states that when she falls asleep she ends up waking up an hour and a half later.  As far as happiness, patient states that she is overwhelmed when thinking.  Patient states that despite being drowsy, she is often very busy during the day.  Patient also expresses that it is common for her to be agitated and blow up at individuals.  A PHQ-9 screen was performed with the patient scoring a 9.  A GAD-7 screen was also performed with the patient going to 10.  Patient is alert and oriented x4, calm, cooperative, and fully engaged in conversation during the encounter.  Patient endorses okay mood.  Patient denies suicidal or homicidal ideations.  No auditory or visual hallucinations and does not appear to be responding to internal/external stimuli.  Patient endorses poor sleep and receives on average 2 to 3 hours of sleep each night.  Patient endorses fair appetite and eats on average 3 meals per day.  Patient denies alcohol consumption and illicit drug use.  Patient endorses tobacco use and has been average a pack per day.  Visit Diagnosis:    ICD-10-CM   1. Insomnia due to other mental disorder  F51.05 trazodone (DESYREL) 150 MG tablet   F99 escitalopram (LEXAPRO) 20 MG tablet    2. Schizoaffective disorder, bipolar type (HCC)  F25.0 escitalopram (LEXAPRO) 20 MG tablet    risperiDONE (RISPERDAL) 1 MG tablet    risperiDONE (RISPERDAL) 2 MG tablet    3. PTSD (post-traumatic stress disorder)  F43.10 escitalopram (LEXAPRO) 20 MG tablet    4. Panic attacks  F41.0 escitalopram (LEXAPRO) 20 MG tablet    5. Attention deficit hyperactivity disorder (ADHD), unspecified ADHD type  F90.9 guanFACINE (TENEX) 2 MG tablet      Past Psychiatric History:  Schizoaffective, bipolar type Insomnia Anxiety PTSD Panic attacks Attention deficit hyperactivity disorder  Past Medical  History:  Past Medical History:  Diagnosis Date   Alcohol abuse    Anxiety    Arthritis    Bipolar disorder ([place])    Depression    GERD (gastroesophageal reflux disease)    H/O hiatal hernia    Hemorrhoids    Small bowel obstruction ([place]) 2012   Wolf-Parkinson-White syndrome    Wolf-Parkinson-White syndrome    per patient    Past Surgical History:  Procedure Laterality Date   ESOPHAGOGASTRODUODENOSCOPY  06/07/2011   Procedure: ESOPHAGOGASTRODUODENOSCOPY (EGD);  Surgeon: Owens Loffler, MD;  Location: [facility];  Service: Endoscopy;  Laterality: N/A;   TUBAL LIGATION      Family Psychiatric History:  Grandfather's sister has schizophrenia Grandmother's brother has a mental illness of some sort  Family History:  Family History  Problem Relation Age of Onset   Breast cancer Maternal Aunt    Colon polyps Maternal Grandmother    Diabetes Maternal Grandmother    Heart attack Maternal Grandmother    Kidney cancer Maternal Grandmother    Breast cancer Maternal Grandmother    Colon cancer Neg Hx    Anesthesia problems Neg Hx    Hypotension Neg Hx    Malignant hyperthermia Neg Hx    Pseudochol deficiency Neg Hx    Heart disease Father    Heart disease Paternal Grandfather    Heart disease Paternal Grandmother     Social History:  Social History   Socioeconomic History   Marital status: Single    Spouse name: Not on file   Number of children: 4   Years of education: Not on file   Highest education level: Not on file  Occupational History   Occupation: house wife  Tobacco Use   Smoking status: Every Day    Packs/day: 1.00    Types: Cigarettes   Smokeless tobacco: Never  Substance and Sexual Activity   Alcohol use: No    Comment: previous alcohol abuse but for last 2 weeks 2 drinks. not currently drinking alcohol   Drug use: No   Sexual activity: Not Currently    Birth control/protection: None  Other Topics Concern   Not on file  Social History Narrative    Not on file   Social Determinants of Health   Financial Resource Strain: Medium Risk (05/27/2020)   Overall Financial Resource Strain (CARDIA)    Difficulty of Paying Living Expenses: Somewhat hard  Food Insecurity: Not on file  Transportation Needs: Not on file  Physical Activity: Inactive (05/27/2020)   Exercise Vital Sign    Days of Exercise per Week: 0 days    Minutes of Exercise per Session: 0 min  Stress: Stress Concern Present (05/27/2020)   [place]    Feeling of Stress : Very much  Social Connections: Moderately Isolated (05/27/2020)   Social Connection and Isolation Panel [NHANES]    Frequency of Communication with Friends and Family: More than three times a week    Frequency of Social Gatherings with Friends and Family: Once a week    Attends Religious Services: Never    Retail buyer of [employer]  or Organizations: No    Attends Archivist Meetings: Never    Marital Status: Married    Allergies:  Allergies  Allergen Reactions   Acetaminophen Hives, Nausea Only and Rash   Codeine Nausea Only    Metabolic Disorder Labs: No results found for: "HGBA1C", "MPG" No results found for: "PROLACTIN" Lab Results  Component Value Date   CHOL  01/03/2009    147        ATP III CLASSIFICATION:  <200     mg/dL   Desirable  200-239  mg/dL   Borderline High  >=240    mg/dL   High          TRIG 141 01/03/2009   HDL 19 (L) 01/03/2009   CHOLHDL 7.7 01/03/2009   VLDL 28 01/03/2009   LDLCALC (H) 01/03/2009    100        Total Cholesterol/HDL:CHD Risk Coronary Heart Disease Risk Table                     Men   Women  1/2 Average Risk   3.4   3.3  Average Risk       5.0   4.4  2 X Average Risk   9.6   7.1  3 X Average Risk  23.4   11.0        Use the calculated Patient Ratio above and the CHD Risk Table to determine the patient's CHD Risk.        ATP III CLASSIFICATION (LDL):  <100     mg/dL    Optimal  100-129  mg/dL   Near or Above                    Optimal  130-159  mg/dL   Borderline  160-189  mg/dL   High  >190     mg/dL   Very High   Lab Results  Component Value Date   TSH 3.259 Test methodology is 3rd generation TSH 01/03/2009    Therapeutic Level Labs: No results found for: "LITHIUM" No results found for: "VALPROATE" No results found for: "CBMZ"  Current Medications: Current Outpatient Medications  Medication Sig Dispense Refill   clonazePAM (KLONOPIN) 1 MG tablet Take 1 tablet (1 mg total) by mouth 2 (two) times daily as needed for anxiety. 60 tablet 2   escitalopram (LEXAPRO) 20 MG tablet Take 2 tablets (40 mg total) by mouth daily. 180 tablet 1   guanFACINE (TENEX) 2 MG tablet Take 1 tablet (2 mg total) by mouth at bedtime. 30 tablet 2   nicotine (NICODERM CQ - DOSED IN MG/24 HOURS) 21 mg/24hr patch Place 1 patch (21 mg total) onto the skin daily. 28 patch 0   risperiDONE (RISPERDAL) 1 MG tablet Take 1 tablet (1 mg total) by mouth daily. 30 tablet 2   risperiDONE (RISPERDAL) 1 MG tablet Take 1 tablet (1 mg total) by mouth daily. 30 tablet 2   risperiDONE (RISPERDAL) 2 MG tablet Take 1 tablet (2 mg total) by mouth at bedtime. 30 tablet 2   risperiDONE (RISPERDAL) 2 MG tablet Take 1 tablet (2 mg total) by mouth at bedtime. 30 tablet 2   thiamine 100 MG tablet Take 1 tablet (100 mg total) by mouth daily. 30 tablet 0   trazodone (DESYREL) 150 MG tablet Take 3 tablets (450 mg total) by mouth at bedtime. 90 tablet 1   No current facility-administered medications for this visit.  Musculoskeletal: Strength & Muscle Tone: Unable to assess due to telemedicine visit [place]: Unable to assess due to telemedicine visit Patient leans: Unable to assess due to telemedicine visit  Psychiatric Specialty Exam: Review of Systems  Psychiatric/Behavioral:  Positive for decreased concentration and sleep disturbance. Negative for dysphoric mood, hallucinations,  self-injury and suicidal ideas. The patient is nervous/anxious. The patient is not hyperactive.     There were no vitals taken for this visit.There is no height or weight on file to calculate BMI.  General Appearance: Unable to assess due to telemedicine visit  Eye Contact:  Unable to assess due to telemedicine visit  Speech:  Clear and Coherent and Normal Rate  Volume:  Normal  Mood:  Anxious and Euthymic  Affect:  Appropriate and Congruent  Thought Process:  Coherent and Descriptions of Associations: Intact  Orientation:  Full (Time, Place, and Person)  Thought Content: WDL   Suicidal Thoughts:  No  Homicidal Thoughts:  No  Memory:  Immediate;   Good Recent;   Fair Remote;   Fair  Judgement:  Fair  Insight:  Fair  Psychomotor Activity:  Normal  Concentration:  Concentration: Good and Attention Span: Good  Recall:  Good  Fund of Knowledge: Good  Language: Good  Akathisia:  No  Handed:  Right  AIMS (if indicated): not done  Assets:  Communication Skills Desire for Improvement Housing  ADL's:  Intact  Cognition: WNL  Sleep:  Poor   Screenings: GAD-7    Flowsheet Row Video Visit from 01/04/2022 in [facility] Video Visit from 10/04/2021 in [facility] Video Visit from 08/08/2021 in [facility] Video Visit from 06/14/2021 in [facility] Video Visit from 04[facility]11/2021 in [facility]  Total GAD-7 Score 10 16 17 20 21       PHQ2-9    Flowsheet Row Video Visit from 01/04/2022 in [facility] Video Visit from 10/04/2021 in [facility] Video Visit from 08/08/2021 in [facility] Video Visit from 06/14/2021 in [facility] Video Visit from 04/11/2021 in [place]  PHQ-2 Total Score 2 2 2 2 4   PHQ-9 Total Score 9  10 17 16 15       Flowsheet Row Video Visit from 01/04/2022 in [facility] Video Visit from 10/04/2021 in [facility] Video Visit from 08/08/2021 in [place] Low Risk Low Risk Low Risk        Assessment and Plan:     [name] is a 51 year old female with a past psychiatric history significant for attention deficit hyperactivity disorder, insomnia, PTSD, schizoaffective disorder (bipolar type), and panic attacks who presents to [facility] via virtual telephone visit for follow-up and medication management.  Presents today with complaints of lack of motivation and impaired thinking.  Patient also continues to have issues with sleep.  Patient was recommended increasing her dosage of trazodone from 300 mg to 450 mg at bedtime.  Patient was agreeable to recommendation.  Patient will like to continue taking her other medications as prescribed.  Patient's medications to be prescribed to pharmacy of choice.  Collaboration of Care: Collaboration of Care: Medication Management AEB provider managing patient's psychiatric medications and Psychiatrist AEB patient being followed by a mental health provider.  Patient/Guardian was advised  Release of Information must be obtained prior to any record release in order to collaborate their care with an outside provider. Patient/Guardian was advised if they have not already done so to contact the registration department to sign all necessary forms in order for [place] to release information regarding their care.   Consent: Patient/Guardian gives verbal consent for treatment and assignment of benefits for services provided during this visit. Patient/Guardian expressed understanding and agreed to proceed.   1. Insomnia due to other mental disorder  - trazodone (DESYREL) 150 MG tablet; Take 3 tablets (450 mg total) by mouth  at bedtime.  Dispense: 90 tablet; Refill: 1 - escitalopram (LEXAPRO) 20 MG tablet; Take 2 tablets (40 mg total) by mouth daily.  Dispense: 180 tablet; Refill: 1  2. Schizoaffective disorder, bipolar type (HCC)  - escitalopram (LEXAPRO) 20 MG tablet; Take 2 tablets (40 mg total) by mouth daily.  Dispense: 180 tablet; Refill: 1 - risperiDONE (RISPERDAL) 1 MG tablet; Take 1 tablet (1 mg total) by mouth daily.  Dispense: 30 tablet; Refill: 2 - risperiDONE (RISPERDAL) 2 MG tablet; Take 1 tablet (2 mg total) by mouth at bedtime.  Dispense: 30 tablet; Refill: 2  3. PTSD (post-traumatic stress disorder)  - escitalopram (LEXAPRO) 20 MG tablet; Take 2 tablets (40 mg total) by mouth daily.  Dispense: 180 tablet; Refill: 1  4. Panic attacks  - escitalopram (LEXAPRO) 20 MG tablet; Take 2 tablets (40 mg total) by mouth daily.  Dispense: 180 tablet; Refill: 1  5. Attention deficit hyperactivity disorder (ADHD), unspecified ADHD type  - guanFACINE (TENEX) 2 MG tablet; Take 1 tablet (2 mg total) by mouth at bedtime.  Dispense: 30 tablet; Refill: 2  Patient to follow up in 2 months Provider spent a total of 21 minutes with the patient/reviewing patient's chart  Malachy Mood, PA 01/07/2022, 10:48 PM

## 2022-01-31 ENCOUNTER — Other Ambulatory Visit (HOSPITAL_COMMUNITY): Payer: Self-pay | Admitting: Physician Assistant

## 2022-01-31 DIAGNOSIS — F250 Schizoaffective disorder, bipolar type: Secondary | ICD-10-CM

## 2022-02-09 ENCOUNTER — Other Ambulatory Visit (HOSPITAL_COMMUNITY): Payer: Self-pay

## 2022-02-19 ENCOUNTER — Telehealth (HOSPITAL_COMMUNITY): Payer: Self-pay | Admitting: *Deleted

## 2022-02-19 ENCOUNTER — Other Ambulatory Visit (HOSPITAL_COMMUNITY): Payer: Self-pay | Admitting: Student in an Organized Health Care Education/Training Program

## 2022-02-19 DIAGNOSIS — F250 Schizoaffective disorder, bipolar type: Secondary | ICD-10-CM

## 2022-02-19 MED ORDER — RISPERIDONE 1 MG PO TABS: 1.0000 mg | ORAL_TABLET | Freq: Every day | ORAL | 0 refills | Status: DC

## 2022-02-19 MED ORDER — RISPERIDONE 2 MG PO TABS: 2.0000 mg | ORAL_TABLET | Freq: Every day | ORAL | 0 refills | Status: DC

## 2022-02-19 NOTE — Telephone Encounter (Signed)
EDD[name] IS OUT OF OFFICE SENDING REFILL REQUEST TO  Dr Antony Salmon   Rx sent refill with message:  risperiDONE (RISPERDAL) not covered by plan   Preferred Alternative: TRAZODONE HCL

## 2022-02-21 ENCOUNTER — Other Ambulatory Visit (HOSPITAL_COMMUNITY): Payer: Self-pay | Admitting: Physician Assistant

## 2022-02-21 DIAGNOSIS — F410 Panic disorder [episodic paroxysmal anxiety] without agoraphobia: Secondary | ICD-10-CM

## 2022-02-22 ENCOUNTER — Other Ambulatory Visit (HOSPITAL_COMMUNITY): Payer: Self-pay | Admitting: Student in an Organized Health Care Education/Training Program

## 2022-02-22 ENCOUNTER — Other Ambulatory Visit: Payer: Self-pay

## 2022-02-22 ENCOUNTER — Telehealth (HOSPITAL_COMMUNITY): Payer: Self-pay | Admitting: *Deleted

## 2022-02-22 ENCOUNTER — Other Ambulatory Visit (HOSPITAL_COMMUNITY): Payer: Self-pay

## 2022-02-22 DIAGNOSIS — F410 Panic disorder [episodic paroxysmal anxiety] without agoraphobia: Secondary | ICD-10-CM

## 2022-02-22 DIAGNOSIS — F250 Schizoaffective disorder, bipolar type: Secondary | ICD-10-CM

## 2022-02-22 MED ORDER — RISPERIDONE 1 MG PO TABS
1.0000 mg | ORAL_TABLET | Freq: Every day | ORAL | 1 refills | Status: DC
  Filled 2022-02-22 – 2022-02-23 (×2): qty 30, 30d supply, fill #0

## 2022-02-22 MED ORDER — RISPERIDONE 2 MG PO TABS: 2.0000 mg | ORAL_TABLET | Freq: Every day | ORAL | 1 refills | Status: DC

## 2022-02-22 MED ORDER — RISPERIDONE 1 MG PO TABS: 1.0000 mg | ORAL_TABLET | Freq: Every day | ORAL | 1 refills | Status: DC

## 2022-02-22 MED ORDER — RISPERIDONE 2 MG PO TABS
2.0000 mg | ORAL_TABLET | Freq: Every day | ORAL | 1 refills | Status: DC
  Filled 2022-02-22 – 2022-02-23 (×2): qty 30, 30d supply, fill #0

## 2022-02-22 MED ORDER — CLONAZEPAM 1 MG PO TABS
1.0000 mg | ORAL_TABLET | Freq: Two times a day (BID) | ORAL | 0 refills | Status: DC | PRN
  Filled 2022-02-22: qty 60, 30d supply, fill #0

## 2022-02-22 MED ORDER — CLONAZEPAM 1 MG PO TABS: 1.0000 mg | ORAL_TABLET | Freq: Two times a day (BID) | ORAL | 0 refills | Status: DC | PRN

## 2022-02-22 NOTE — Telephone Encounter (Signed)
Call from patient requesting her klonopin and risperdal both strengths be called in to her pharmacy. She has a future appt on 03/06/22 and should be out of her medicines. In [place] absence will forward this request to the covering provider which is Dr Morrie Sheldon.

## 2022-02-22 NOTE — Telephone Encounter (Signed)
Requests her Klonopin be called into [facility] on [address], other med can be called in to [facility]

## 2022-02-22 NOTE — Progress Notes (Addendum)
1 month supply (60 tablets) Klonopin 1mg  BID PRN sent in to [facility] on [address]. Refills for Risperdal 1mg / 2mg  sent [facility]. Patient's routine prescribed is out of the office.   PGY-3 , MD

## 2022-02-22 NOTE — Addendum Note (Signed)
Addended by: Eliseo Gum B on: 02/22/2022 01:04 PM   Modules accepted: Orders

## 2022-02-23 ENCOUNTER — Other Ambulatory Visit (HOSPITAL_COMMUNITY): Payer: Self-pay

## 2022-03-15 ENCOUNTER — Encounter (HOSPITAL_COMMUNITY): Payer: Self-pay | Admitting: Physician Assistant

## 2022-03-15 ENCOUNTER — Telehealth (INDEPENDENT_AMBULATORY_CARE_PROVIDER_SITE_OTHER): Payer: No Payment, Other | Admitting: Physician Assistant

## 2022-03-15 DIAGNOSIS — F410 Panic disorder [episodic paroxysmal anxiety] without agoraphobia: Secondary | ICD-10-CM

## 2022-03-15 DIAGNOSIS — F4310 Post-traumatic stress disorder, unspecified: Secondary | ICD-10-CM

## 2022-03-15 DIAGNOSIS — F909 Attention-deficit hyperactivity disorder, unspecified type: Secondary | ICD-10-CM

## 2022-03-15 DIAGNOSIS — F5105 Insomnia due to other mental disorder: Secondary | ICD-10-CM

## 2022-03-15 DIAGNOSIS — F99 Mental disorder, not otherwise specified: Secondary | ICD-10-CM

## 2022-03-15 DIAGNOSIS — F250 Schizoaffective disorder, bipolar type: Secondary | ICD-10-CM | POA: Diagnosis not present

## 2022-03-15 NOTE — Progress Notes (Signed)
[facility] MD/PA/NP OP Progress Note  Virtual Visit via Telephone Note  I connected with [name] on 03/15/22 at  4:30 PM EDT by telephone and verified that I am speaking with the correct person using two identifiers.  Location: Patient: Home Provider: Clinic   I discussed the limitations, risks, security and privacy concerns of performing an evaluation and management service by telephone and the availability of in person appointments. I also discussed with the patient that there may be a patient responsible charge related to this service. The patient expressed understanding and agreed to proceed.  Follow Up Instructions:   I discussed the assessment and treatment plan with the patient. The patient was provided an opportunity to ask questions and all were answered. The patient agreed with the plan and demonstrated an understanding of the instructions.   The patient was advised to call back or seek an in-person evaluation if the symptoms worsen or if the condition fails to improve as anticipated.  I provided 16 minutes of non-face-to-face time during this encounter.  Ashley Mood, PA   03/15/2022 11:47 PM [name]  MRN:  [id number]  Chief Complaint:  Chief Complaint  Patient presents with   Follow-up   Medication Management   HPI:   [name] is a 51 year old female with a past psychiatric history significant for attention deficit hyperactivity disorder, insomnia, PTSD, schizoaffective disorder (bipolar type), and panic attacks who presents to [facility] via virtual telephone visit for follow-up and medication management.  Patient is currently being managed on the following medications:  Guanfacine 2 mg at bedtime Escitalopram 40 mg daily Clonazepam 1 mg 2 times daily as needed Risperidone 1 mg daily/2 mg at bedtime Benztropine 0.5 mg 2 times daily Trazodone 300 mg at bedtime  Patient notes that she has been feeling dreary lately  and that her emotions change from day to day.  Patient reports that she is interested in being placed on the medication, Vraylar since she noticed that the medication was helpful in managing her mother's depressive symptoms.  Patient notes that she is more depressed and has not felt perky in quite some time.  Patient further adds that she is overly tired and experiencing crying spells.  Patient feels that her fatigue is brought upon by her use of trazodone stating that the use of the medication caused her to feel tired the next day.  Patient reports that her anxiety is still elevated and describes it as being "at the top of the roof."  Patient states that she is overly stressed and that she often gets in her head and over things.  Patient endorses the new stressor involving disputes between her father and her husband.  Due to all of the patient's current issues, she feels like a dark cloud is hanging over her head.  Patient is unsure of where her emotions were coming from.  A PHQ-9 screen was performed with the patient scoring an 8.  A GAD-7 screen was also performed with the patient scoring a 16.  Patient is alert and oriented x4, calm, cooperative, and fully engaged in conversation during the encounter.  Patient endorses feeling tired.  Patient denies suicidal or homicidal ideations.  She further denied auditory or visual hallucinations and does not appear to be responding to internal/external stimuli.  Patient endorses fair sleep and receives on average 6 hours of intermittent sleep.  Patient endorses decreased appetite and eats at least 1 good meal per day.  Patient denies  alcohol consumption and illicit drug use.  Patient endorses tobacco use and smokes on average a pack per day.  Visit Diagnosis:  No diagnosis found.   Past Psychiatric History:  Schizoaffective, bipolar type Insomnia Anxiety PTSD Panic attacks Attention deficit hyperactivity disorder  Past Medical History:  Past Medical  History:  Diagnosis Date   Alcohol abuse    Anxiety    Arthritis    Bipolar disorder (HCC)    Depression    GERD (gastroesophageal reflux disease)    H/O hiatal hernia    Hemorrhoids    Small bowel obstruction (HCC) 2012   Wolf-Parkinson-White syndrome    Wolf-Parkinson-White syndrome    per patient    Past Surgical History:  Procedure Laterality Date   ESOPHAGOGASTRODUODENOSCOPY  06/07/2011   Procedure: ESOPHAGOGASTRODUODENOSCOPY (EGD);  Surgeon: Rob Bunting, MD;  Location: [facility];  Service: Endoscopy;  Laterality: N/A;   TUBAL LIGATION      Family Psychiatric History:  Grandfather's sister has schizophrenia Grandmother's brother has a mental illness of some sort  Family History:  Family History  Problem Relation Age of Onset   Breast cancer Maternal Aunt    Colon polyps Maternal Grandmother    Diabetes Maternal Grandmother    Heart attack Maternal Grandmother    Kidney cancer Maternal Grandmother    Breast cancer Maternal Grandmother    Colon cancer Neg Hx    Anesthesia problems Neg Hx    Hypotension Neg Hx    Malignant hyperthermia Neg Hx    Pseudochol deficiency Neg Hx    Heart disease Father    Heart disease Paternal Grandfather    Heart disease Paternal Grandmother     Social History:  Social History   Socioeconomic History   Marital status: Single    Spouse name: Not on file   Number of children: 4   Years of education: Not on file   Highest education level: Not on file  Occupational History   Occupation: house wife  Tobacco Use   Smoking status: Every Day    Packs/day: 1.00    Types: Cigarettes   Smokeless tobacco: Never  Substance and Sexual Activity   Alcohol use: No    Comment: previous alcohol abuse but for last 2 weeks 2 drinks. not currently drinking alcohol   Drug use: No   Sexual activity: Not Currently    Birth control/protection: None  Other Topics Concern   Not on file  Social History Narrative   Not on file   Social  Determinants of Health   Financial Resource Strain: Medium Risk (05/27/2020)   Overall Financial Resource Strain (CARDIA)    Difficulty of Paying Living Expenses: Somewhat hard  Food Insecurity: No Food Insecurity (05/27/2020)   Hunger Vital Sign    Worried About Running Out of Food in the Last Year: Never true    Ran Out of Food in the Last Year: Never true  Transportation Needs: No Transportation Needs (05/27/2020)   PRAPARE - Administrator, Civil Service (Medical): No    Lack of Transportation (Non-Medical): No  Physical Activity: Inactive (05/27/2020)   Exercise Vital Sign    Days of Exercise per Week: 0 days    Minutes of Exercise per Session: 0 min  Stress: Stress Concern Present (05/27/2020)   [employer] of Occupational Health - Occupational Stress Questionnaire    Feeling of Stress : Very much  Social Connections: Moderately Isolated (05/27/2020)   Social Connection and Isolation Panel [NHANES]  Frequency of Communication with Friends and Family: More than three times a week    Frequency of Social Gatherings with Friends and Family: Once a week    Attends Religious Services: Never    Database administrator or Organizations: No    Attends Banker Meetings: Never    Marital Status: Married    Allergies:  Allergies  Allergen Reactions   Acetaminophen Hives, Nausea Only and Rash   Codeine Nausea Only    Metabolic Disorder Labs: No results found for: "HGBA1C", "MPG" No results found for: "PROLACTIN" Lab Results  Component Value Date   CHOL  01/03/2009    147        ATP III CLASSIFICATION:  <200     mg/dL   Desirable  027-253  mg/dL   Borderline High  >=664    mg/dL   High          TRIG 403 01/03/2009   HDL 19 (L) 01/03/2009   CHOLHDL 7.7 01/03/2009   VLDL 28 01/03/2009   LDLCALC (H) 01/03/2009    100        Total Cholesterol/HDL:CHD Risk Coronary Heart Disease Risk Table                     Men   Women  1/2 Average Risk    3.4   3.3  Average Risk       5.0   4.4  2 X Average Risk   9.6   7.1  3 X Average Risk  23.4   11.0        Use the calculated Patient Ratio above and the CHD Risk Table to determine the patient's CHD Risk.        ATP III CLASSIFICATION (LDL):  <100     mg/dL   Optimal  474-259  mg/dL   Near or Above                    Optimal  130-159  mg/dL   Borderline  563-875  mg/dL   High  >643     mg/dL   Very High   Lab Results  Component Value Date   TSH 3.259 Test methodology is 3rd generation TSH 01/03/2009    Therapeutic Level Labs: No results found for: "LITHIUM" No results found for: "VALPROATE" No results found for: "CBMZ"  Current Medications: Current Outpatient Medications  Medication Sig Dispense Refill   clonazePAM (KLONOPIN) 1 MG tablet Take 1 tablet (1 mg total) by mouth 2 (two) times daily as needed for anxiety. 60 tablet 0   escitalopram (LEXAPRO) 20 MG tablet Take 2 tablets (40 mg total) by mouth daily. 180 tablet 1   guanFACINE (TENEX) 2 MG tablet Take 1 tablet (2 mg total) by mouth at bedtime. 30 tablet 2   nicotine (NICODERM CQ - DOSED IN MG/24 HOURS) 21 mg/24hr patch Place 1 patch (21 mg total) onto the skin daily. 28 patch 0   risperiDONE (RISPERDAL) 1 MG tablet Take 1 tablet (1 mg total) by mouth daily. 30 tablet 1   risperiDONE (RISPERDAL) 2 MG tablet Take 1 tablet (2 mg total) by mouth at bedtime. 30 tablet 1   thiamine 100 MG tablet Take 1 tablet (100 mg total) by mouth daily. 30 tablet 0   trazodone (DESYREL) 150 MG tablet Take 3 tablets (450 mg total) by mouth at bedtime. 90 tablet 1   No current facility-administered medications for this visit.  Musculoskeletal: Strength & Muscle Tone: Unable to assess due to telemedicine visit [place]: Unable to assess due to telemedicine visit Patient leans: Unable to assess due to telemedicine visit  Psychiatric Specialty Exam: Review of Systems  Psychiatric/Behavioral:  Positive for decreased  concentration and sleep disturbance. Negative for dysphoric Rios, hallucinations, self-injury and suicidal ideas. The patient is nervous/anxious. The patient is not hyperactive.     There were no vitals taken for this visit.There is no height or weight on file to calculate BMI.  General Appearance: Unable to assess due to telemedicine visit  Eye Contact:  Unable to assess due to telemedicine visit  Speech:  Clear and Coherent and Normal Rate  Volume:  Normal  Rios:  Anxious and Euthymic  Affect:  Appropriate and Congruent  Thought Process:  Coherent and Descriptions of Associations: Intact  Orientation:  Full (Time, Place, and Person)  Thought Content: WDL   Suicidal Thoughts:  No  Homicidal Thoughts:  No  Memory:  Immediate;   Good Recent;   Fair Remote;   Fair  Judgement:  Fair  Insight:  Fair  Psychomotor Activity:  Normal  Concentration:  Concentration: Good and Attention Span: Good  Recall:  Good  Fund of Knowledge: Good  Language: Good  Akathisia:  No  Handed:  Right  AIMS (if indicated): not done  Assets:  Communication Skills Desire for Improvement Housing  ADL's:  Intact  Cognition: WNL  Sleep:  Poor   Screenings: GAD-7    Flowsheet Row Video Visit from 03/15/2022 in [facility] Video Visit from 01/04/2022 in [facility] Video Visit from 10/04/2021 in [facility] Video Visit from 08/08/2021 in [facility] Video Visit from 06/14/2021 in [facility]  Total GAD-7 Score 16 10 16 17 20       PHQ2-9    Flowsheet Row Video Visit from 03/15/2022 in [facility] Video Visit from 01/04/2022 in [facility] Video Visit from 10/04/2021 in [facility] Video Visit from 08/08/2021 in [facility] Video Visit from 06/14/2021 in [facility]  PHQ-2 Total Score 5 2 2 2 2   PHQ-9 Total Score 8 9 10 17 16       Flowsheet Row Video Visit from 03/15/2022 in [facility] Video Visit from 01/04/2022 in [facility] Video Visit from 10/04/2021 in [place] Low Risk Low Risk Low Risk        Assessment and Plan:     [name] is a 51 year old female with a past psychiatric history significant for attention deficit hyperactivity disorder, insomnia, PTSD, schizoaffective disorder (bipolar type), and panic attacks who presents to [facility] via virtual telephone visit for follow-up and medication management.  Patient reports that she has been experiencing changes in her emotions from day to day.  Patient endorses feeling more depressed and is unsure of what is causing her current Rios change.  Patient is interested in being placed on Vraylar due to the beneficial effect it had on her mother's Rios.  Provider discussed with patient that if she were to be placed on Vraylar then she would have to be tapered off her risperidone since they are both antipsychotic medications.  Provider also discussed with patient that Arman Filter tends to be an expensive  medication and her insurance may not cover the medication.  Patient informed provider that she would contact her insurance company to see if Arman Filter is covered on her plan.  Patient to continue taking all of her other medications as prescribed.  Collaboration of Care: Collaboration of Care: Medication Management AEB provider managing patient's psychiatric medications and Psychiatrist AEB patient being followed by a mental health provider.  Patient/Guardian was advised Release of Information must be obtained prior to any record release in order to collaborate their care with an outside provider. Patient/Guardian was advised if they  have not already done so to contact the registration department to sign all necessary forms in order for [place] to release information regarding their care.   Consent: Patient/Guardian gives verbal consent for treatment and assignment of benefits for services provided during this visit. Patient/Guardian expressed understanding and agreed to proceed.   1. Schizoaffective disorder, bipolar type ([place]) Patient to continue taking risperidone 1 mg daily for the management of her schizoaffective disorder Patient to continue taking risperidone 2 mg at bedtime for the management of her schizoaffective disorder  2. Insomnia due to other mental disorder Patient to continue taking trazodone 450 mg at bedtime for the management of her insomnia  3. PTSD (post-traumatic stress disorder) Patient to continue taking escitalopram 40 mg daily for the management of her PTSD  4. Attention deficit hyperactivity disorder (ADHD), unspecified ADHD type Patient to continue taking guanfacine 2 mg at bedtime for the management of her attention deficit hyperactivity disorder  5. Panic attacks Patient to continue taking escitalopram 40 mg daily for the management of her panic attacks Patient to continue taking clonazepam 1 mg 2 times daily as needed for the management of her panic attacks  Patient to follow up in 2 months Provider spent a total of 16 minutes with the patient/reviewing patient's chart  Ashley Mood, PA 03/15/2022, 11:47 PM

## 2022-03-16 ENCOUNTER — Telehealth (HOSPITAL_COMMUNITY): Payer: Self-pay | Admitting: *Deleted

## 2022-03-16 NOTE — Telephone Encounter (Signed)
VM from patient following up today after speaking with her provider yesterday. She and Eddie PA discussed changing medicines and she checked on the price for Vraylar which was one of the medicines being considered and she cant afford it as she checked with insurance and it would be a 50.00 copay. PA suggested Buspar or Wellbutrin and she is willing to try either and would like Eddie PA to send it in to her pharmacy on [address] [employer]

## 2022-03-19 ENCOUNTER — Other Ambulatory Visit (HOSPITAL_COMMUNITY): Payer: Self-pay | Admitting: Physician Assistant

## 2022-03-19 DIAGNOSIS — F5105 Insomnia due to other mental disorder: Secondary | ICD-10-CM

## 2022-04-03 ENCOUNTER — Other Ambulatory Visit (HOSPITAL_COMMUNITY): Payer: Self-pay | Admitting: Physician Assistant

## 2022-04-03 ENCOUNTER — Telehealth (HOSPITAL_COMMUNITY): Payer: Self-pay

## 2022-04-03 DIAGNOSIS — F5105 Insomnia due to other mental disorder: Secondary | ICD-10-CM

## 2022-04-03 DIAGNOSIS — F410 Panic disorder [episodic paroxysmal anxiety] without agoraphobia: Secondary | ICD-10-CM

## 2022-04-03 MED ORDER — CLONAZEPAM 1 MG PO TABS: 1.0000 mg | ORAL_TABLET | Freq: Two times a day (BID) | ORAL | 0 refills | Status: DC | PRN

## 2022-04-03 MED ORDER — TRAZODONE HCL 150 MG PO TABS: ORAL_TABLET | ORAL | 1 refills | Status: DC

## 2022-04-03 NOTE — Telephone Encounter (Signed)
Provider was contacted by Rosita Kea. Ladona Ridgel, RN regarding patient's medication refill.  Patient is requesting that her clonazepam and trazodone be refilled.  Patient's medications to be e-prescribed to pharmacy of choice.

## 2022-04-03 NOTE — Telephone Encounter (Signed)
Medication refill - Telephone call with patient to follow up on her message left she is in need of a new Trazodone and Clonazepam order.  Informed pt Otila Back, PA sent in a new Trazodone order on 03/22/22 and she is to take 3 of the 150 mg at night.  Agreed to inform Otila Back, PA-C patient is still in need of a new Clonazepam order to be sent into her [facility] on [address]. Order last provided 02/22/22.

## 2022-04-03 NOTE — Telephone Encounter (Signed)
Medication management - Telephone call with pt to inform Otila Back, PA-C had sent in her requested new Clonazepam and Trazodone orders.  Patient also wanted to let PA know she was feeling more depressed and wanted to try Buspar or Wellbutrin, which she stated collateral discussed with her in the past as an add on for depression.  Agreed to inform Otila Back, PA-C of her request.

## 2022-04-03 NOTE — Progress Notes (Signed)
Provider was contacted by Ashley V. Taylor, RN regarding patient's medication refill.  Patient is requesting that her clonazepam and trazodone be refilled.  Patient's medications to be e-prescribed to pharmacy of choice.

## 2022-04-16 ENCOUNTER — Telehealth (HOSPITAL_COMMUNITY): Payer: Self-pay | Admitting: *Deleted

## 2022-04-16 NOTE — Telephone Encounter (Signed)
Rx REFILL REQUEST -- risperiDONE (RISPERDAL) 2 MG tablet Take 1 tablet (2 mg total) by mouth at bedtime.,

## 2022-04-16 NOTE — Telephone Encounter (Signed)
Rx REFILL REQUEST --- risperiDONE (RISPERDAL) 1 MG tablet Take 1 tablet (1 mg total) by mouth daily

## 2022-04-18 ENCOUNTER — Other Ambulatory Visit (HOSPITAL_COMMUNITY): Payer: Self-pay | Admitting: Physician Assistant

## 2022-04-18 ENCOUNTER — Other Ambulatory Visit: Payer: Self-pay

## 2022-04-18 DIAGNOSIS — F250 Schizoaffective disorder, bipolar type: Secondary | ICD-10-CM

## 2022-04-18 MED ORDER — RISPERIDONE 2 MG PO TABS
2.0000 mg | ORAL_TABLET | Freq: Every day | ORAL | 2 refills | Status: DC
  Filled 2022-04-18: qty 30, 30d supply, fill #0

## 2022-04-18 MED ORDER — RISPERIDONE 1 MG PO TABS
1.0000 mg | ORAL_TABLET | Freq: Every day | ORAL | 2 refills | Status: DC
  Filled 2022-04-18: qty 30, 30d supply, fill #0

## 2022-04-18 NOTE — Progress Notes (Signed)
Provider was contacted by Eliezer Lofts, RMA guarding refill request for this patient.  Patient's medication to be e-prescribed to pharmacy of choice.

## 2022-04-18 NOTE — Telephone Encounter (Signed)
Message acknowledged and reviewed.

## 2022-04-18 NOTE — Telephone Encounter (Signed)
Message acknowledged and reviewed. Patient will be transferred over to new provider.

## 2022-04-30 ENCOUNTER — Other Ambulatory Visit (HOSPITAL_COMMUNITY): Payer: Self-pay | Admitting: Student in an Organized Health Care Education/Training Program

## 2022-04-30 ENCOUNTER — Telehealth (HOSPITAL_COMMUNITY): Payer: Self-pay | Admitting: *Deleted

## 2022-04-30 ENCOUNTER — Other Ambulatory Visit (HOSPITAL_COMMUNITY): Payer: Self-pay | Admitting: Physician Assistant

## 2022-04-30 DIAGNOSIS — F410 Panic disorder [episodic paroxysmal anxiety] without agoraphobia: Secondary | ICD-10-CM

## 2022-04-30 MED ORDER — CLONAZEPAM 1 MG PO TABS: 1.0000 mg | ORAL_TABLET | Freq: Two times a day (BID) | ORAL | 0 refills | Status: DC | PRN

## 2022-04-30 NOTE — Telephone Encounter (Signed)
done

## 2022-04-30 NOTE — Progress Notes (Signed)
Patient 1 month refill [facility] [address]. PDMP confirmed patient has been requiring 30 day supplies almost monthly of Klonopin 1mg  BID. Patient will be seeing this provider 11/7.  Patient previous provider has left practice.   PGY-3 Damita Dunnings, MD

## 2022-04-30 NOTE — Telephone Encounter (Signed)
Please Resend to ---  [place] [place] - [address] AT [facility] OF [address]  clonazePAM (KLONOPIN) 1 MG tablet Take 1 tablet (1 mg total) by mouth 2 (two)  times daily as needed for anxiety

## 2022-04-30 NOTE — Progress Notes (Signed)
Called [facility] [address] to cancel rx of Klonopin 1mg  BID, after confirmation resent the rx to preferred pharmacy of [place].  PGY-2 Damita Dunnings, MD

## 2022-04-30 NOTE — Telephone Encounter (Signed)
PREVIOUS PROVIDER SEEN VIRTUAL 03/15/22 ---PATIENT CALLING FOR  REFILL REQUEST  clonazePAM (KLONOPIN) 1 MG tablet Take 1 tablet (1 mg total) by mouth 2 (two)  times daily as needed for anxiety

## 2022-05-01 ENCOUNTER — Other Ambulatory Visit (HOSPITAL_COMMUNITY): Payer: Self-pay | Admitting: Physician Assistant

## 2022-05-01 DIAGNOSIS — F410 Panic disorder [episodic paroxysmal anxiety] without agoraphobia: Secondary | ICD-10-CM

## 2022-05-28 ENCOUNTER — Telehealth (HOSPITAL_COMMUNITY): Payer: Self-pay | Admitting: *Deleted

## 2022-05-28 NOTE — Telephone Encounter (Signed)
Rx Refill Request -- clonazePAM (KLONOPIN) 1 MG tablet Take 1 tablet (1 mg total) by mouth  2 (two) times daily as needed for anxiety

## 2022-05-31 ENCOUNTER — Other Ambulatory Visit (HOSPITAL_COMMUNITY): Payer: Self-pay | Admitting: Physician Assistant

## 2022-05-31 ENCOUNTER — Telehealth (HOSPITAL_COMMUNITY): Payer: Self-pay | Admitting: *Deleted

## 2022-05-31 DIAGNOSIS — F410 Panic disorder [episodic paroxysmal anxiety] without agoraphobia: Secondary | ICD-10-CM

## 2022-05-31 MED ORDER — CLONAZEPAM 1 MG PO TABS: 1.0000 mg | ORAL_TABLET | Freq: Two times a day (BID) | ORAL | 0 refills | Status: DC | PRN

## 2022-05-31 NOTE — Progress Notes (Signed)
Provider was contacted by Eliezer Lofts, RMA regarding patient's request for clonazepam to be refilled.  Patient's medication to be e-prescribed to pharmacy of choice.  Patient's next appointment is scheduled for 06/05/2022.

## 2022-05-31 NOTE — Telephone Encounter (Signed)
[facility] Rx Refill Request -- clonazePAM (KLONOPIN) 1 MG tablet Take 1 tablet (1 mg total) by mouth  2 (two) times daily as needed for anxiety

## 2022-06-05 ENCOUNTER — Telehealth (HOSPITAL_COMMUNITY): Payer: No Payment, Other | Admitting: Student in an Organized Health Care Education/Training Program

## 2022-06-05 ENCOUNTER — Encounter (HOSPITAL_COMMUNITY): Payer: Self-pay

## 2022-06-14 ENCOUNTER — Telehealth (HOSPITAL_COMMUNITY): Payer: No Payment, Other | Admitting: Physician Assistant

## 2022-06-25 ENCOUNTER — Telehealth (HOSPITAL_COMMUNITY): Payer: Self-pay | Admitting: *Deleted

## 2022-06-25 NOTE — Telephone Encounter (Signed)
Re-Opened in Error

## 2022-06-25 NOTE — Telephone Encounter (Signed)
Patient called requesting to speak with provider Dr Morrie Sheldon & refill request clonazePAM (KLONOPIN) 1 MG tablet  sent to  [id number] - [place], [place] - [address] AT [facility] OF [address]  Patient hasn't been seen since Otila Back 02/27/22 recent appt on 06/05/22 canceled by office to be rescheduled 06/14/22 not done

## 2022-06-26 ENCOUNTER — Other Ambulatory Visit (HOSPITAL_COMMUNITY): Payer: Self-pay | Admitting: Student in an Organized Health Care Education/Training Program

## 2022-06-26 DIAGNOSIS — F410 Panic disorder [episodic paroxysmal anxiety] without agoraphobia: Secondary | ICD-10-CM

## 2022-06-26 NOTE — Telephone Encounter (Signed)
Denied, will be seeing patient this Thursday, and she should not need a refill before then

## 2022-06-28 ENCOUNTER — Telehealth (INDEPENDENT_AMBULATORY_CARE_PROVIDER_SITE_OTHER): Payer: No Payment, Other | Admitting: Student in an Organized Health Care Education/Training Program

## 2022-06-28 ENCOUNTER — Encounter (HOSPITAL_COMMUNITY): Payer: Self-pay | Admitting: Student in an Organized Health Care Education/Training Program

## 2022-06-28 ENCOUNTER — Other Ambulatory Visit (HOSPITAL_COMMUNITY): Payer: Self-pay | Admitting: Physician Assistant

## 2022-06-28 DIAGNOSIS — F5105 Insomnia due to other mental disorder: Secondary | ICD-10-CM

## 2022-06-28 DIAGNOSIS — F250 Schizoaffective disorder, bipolar type: Secondary | ICD-10-CM | POA: Diagnosis not present

## 2022-06-28 DIAGNOSIS — F4310 Post-traumatic stress disorder, unspecified: Secondary | ICD-10-CM | POA: Diagnosis not present

## 2022-06-28 DIAGNOSIS — F410 Panic disorder [episodic paroxysmal anxiety] without agoraphobia: Secondary | ICD-10-CM | POA: Diagnosis not present

## 2022-06-28 DIAGNOSIS — F99 Mental disorder, not otherwise specified: Secondary | ICD-10-CM

## 2022-06-28 MED ORDER — CLONAZEPAM 1 MG PO TABS: 1.0000 mg | ORAL_TABLET | Freq: Every day | ORAL | 0 refills | Status: DC

## 2022-06-28 MED ORDER — CLONAZEPAM 0.5 MG PO TABS: 0.5000 mg | ORAL_TABLET | Freq: Every day | ORAL | 0 refills | Status: DC

## 2022-06-28 MED ORDER — LAMOTRIGINE 25 MG PO TABS: 25.0000 mg | ORAL_TABLET | Freq: Every day | ORAL | 0 refills | Status: DC

## 2022-06-28 MED ORDER — TRAZODONE HCL 150 MG PO TABS: ORAL_TABLET | ORAL | 1 refills | Status: DC

## 2022-06-28 MED ORDER — RISPERIDONE 2 MG PO TABS: 2.0000 mg | ORAL_TABLET | Freq: Every day | ORAL | 2 refills | Status: DC

## 2022-06-28 MED ORDER — ESCITALOPRAM OXALATE 20 MG PO TABS: 40.0000 mg | ORAL_TABLET | Freq: Every day | ORAL | 1 refills | Status: DC

## 2022-06-28 MED ORDER — RISPERIDONE 1 MG PO TABS: 1.0000 mg | ORAL_TABLET | Freq: Every day | ORAL | 2 refills | Status: DC

## 2022-06-28 NOTE — Progress Notes (Signed)
BH MD/PA/NP OP Progress Note  06/28/2022 12:03 PM [name]  MRN:  [id number]  Chief Complaint:  Chief Complaint  Patient presents with   Follow-up   Virtual Visit via Telephone Note  I connected with [name] on 06/28/22 at 10:30 AM EST by telephone and verified that I am speaking with the correct person using two identifiers.  Location: Patient: Car with father driving, endorses okay with his presence during the visit Provider: Office   I discussed the limitations, risks, security and privacy concerns of performing an evaluation and management service by telephone and the availability of in person appointments. I also discussed with the patient that there may be a patient responsible charge related to this service. The patient expressed understanding and agreed to proceed.   History of Present Illness:   [name] is a 51 year old female with a past psychiatric history significant for attention deficit hyperactivity disorder, insomnia, PTSD, schizoaffective disorder (bipolar type), and panic attacks who presents to [facility] via virtual telephone visit for follow-up and medication management.    On assessment today patient reports that she feels her anxiety is worsening and she believes she based on her Klonopin.  Patient reports that she has been taking her Lexapro 40 mg, Klonopin 1 mg twice daily, trazodone 450 mg nightly, and Risperdal 1 mg in the a.m. and 2 mg nightly.  Patient reports that she believes her Lexapro was previously beneficial however she thinks she has reached tolerance of the medication.  Patient reports she feels like her emotions have been more labile endorsing that she has become more tearful lately but does not consciously feel depressed.  Patient reports that she is also been having poor hygiene and has not been washing her face which is concerning to her.  Patient reports that she is also been having "manic"  states where she becomes more "agitated, irritable and stressed."  Patient reports that these periods last approximately 20 hours and she feels that her thoughts are racing and endorses rapid speech.  Patient reports that these happen almost every day.  Patient reports that she is having anhedonia as well as feeling hopeless/worthless/guilt, low energy, and poor concentration.  Patient reports her appetite is stable and she has not had any significant weight loss.  Patient is a bit unclear about whether or not she has been sleeping well but overall endorses she is sleeping at least every day.  Patient denies SI and HI.  Patient reports that she has chronic AH and endorses that she feels like it worsens with the Risperdal.  However when provider suggest decreasing Risperdal, patient endorses that she has been on the medication for 20 years and appears to be against this idea.  Patient reports that when she has auditory hallucination she feels like she hears someone talking about her outside of her window but she is fully aware no one is there.  Patient reports that it has been over.  Appears where she is associated taking her Risperdal with worsening AH.  Patient and provider discussed adding Lamictal to address patient's labile mood and dysphoric symptoms.  Patient and provider also discussed the patient does need to be titrated down off her Klonopin.  Patient is against this idea however, provider emphasizes that patient cannot continue on Klonopin and definitely cannot have increase in her medication.  Provider emphasizes that if patient is experiencing worsened anxiety and mood lability, Klonopin will not treat these problems chronically.  Patient reports that  she feels like she needs her Klonopin.  I discussed the assessment and treatment plan with the patient. The patient was provided an opportunity to ask questions and all were answered. The patient agreed with the plan and demonstrated an understanding  of the instructions.   The patient was advised to call back or seek an in-person evaluation if the symptoms worsen or if the condition fails to improve as anticipated.  I provided 25 minutes of non-face-to-face time during this encounter.   Bobbye Morton, MD  Visit Diagnosis:    ICD-10-CM   1. Schizoaffective disorder, bipolar type (HCC)  F25.0 escitalopram (LEXAPRO) 20 MG tablet    risperiDONE (RISPERDAL) 2 MG tablet    risperiDONE (RISPERDAL) 1 MG tablet    lamoTRIgine (LAMICTAL) 25 MG tablet    2. Panic attacks  F41.0 clonazePAM (KLONOPIN) 0.5 MG tablet    escitalopram (LEXAPRO) 20 MG tablet    clonazePAM (KLONOPIN) 1 MG tablet    3. PTSD (post-traumatic stress disorder)  F43.10 escitalopram (LEXAPRO) 20 MG tablet    4. Insomnia due to other mental disorder  F51.05 escitalopram (LEXAPRO) 20 MG tablet   F99 traZODone (DESYREL) 150 MG tablet      Past Psychiatric History:  Schizoaffective, bipolar type Insomnia Anxiety PTSD Panic attacks Attention deficit hyperactivity disorder  Past Medical History:  Past Medical History:  Diagnosis Date   Alcohol abuse    Anxiety    Arthritis    Bipolar disorder (HCC)    Depression    GERD (gastroesophageal reflux disease)    H/O hiatal hernia    Hemorrhoids    Small bowel obstruction (HCC) 2012   Wolf-Parkinson-White syndrome    Wolf-Parkinson-White syndrome    per patient    Past Surgical History:  Procedure Laterality Date   ESOPHAGOGASTRODUODENOSCOPY  06/07/2011   Procedure: ESOPHAGOGASTRODUODENOSCOPY (EGD);  Surgeon: Rob Bunting, MD;  Location: [facility];  Service: Endoscopy;  Laterality: N/A;   TUBAL LIGATION      Family Psychiatric History: Grandfather's sister has schizophrenia Grandmother's brother has a mental illness of some sort  Family History:  Family History  Problem Relation Age of Onset   Breast cancer Maternal Aunt    Colon polyps Maternal Grandmother    Diabetes Maternal Grandmother    Heart  attack Maternal Grandmother    Kidney cancer Maternal Grandmother    Breast cancer Maternal Grandmother    Colon cancer Neg Hx    Anesthesia problems Neg Hx    Hypotension Neg Hx    Malignant hyperthermia Neg Hx    Pseudochol deficiency Neg Hx    Heart disease Father    Heart disease Paternal Grandfather    Heart disease Paternal Grandmother     Social History:  Social History   Socioeconomic History   Marital status: Single    Spouse name: Not on file   Number of children: 4   Years of education: Not on file   Highest education level: Not on file  Occupational History   Occupation: house wife  Tobacco Use   Smoking status: Every Day    Packs/day: 1.00    Types: Cigarettes   Smokeless tobacco: Never  Substance and Sexual Activity   Alcohol use: No    Comment: previous alcohol abuse but for last 2 weeks 2 drinks. not currently drinking alcohol   Drug use: No   Sexual activity: Not Currently    Birth control/protection: None  Other Topics Concern   Not on file  Social  History Narrative   Not on file   Social Determinants of Health   Financial Resource Strain: Medium Risk (05/27/2020)   Overall Financial Resource Strain (CARDIA)    Difficulty of Paying Living Expenses: Somewhat hard  Food Insecurity: No Food Insecurity (05/27/2020)   Hunger Vital Sign    Worried About Running Out of Food in the Last Year: Never true    Ran Out of Food in the Last Year: Never true  Transportation Needs: No Transportation Needs (05/27/2020)   PRAPARE - Administrator, Civil Service (Medical): No    Lack of Transportation (Non-Medical): No  Physical Activity: Inactive (05/27/2020)   Exercise Vital Sign    Days of Exercise per Week: 0 days    Minutes of Exercise per Session: 0 min  Stress: Stress Concern Present (05/27/2020)   [employer] of Occupational Health - Occupational Stress Questionnaire    Feeling of Stress : Very much  Social Connections: Moderately  Isolated (05/27/2020)   Social Connection and Isolation Panel [NHANES]    Frequency of Communication with Friends and Family: More than three times a week    Frequency of Social Gatherings with Friends and Family: Once a week    Attends Religious Services: Never    Database administrator or Organizations: No    Attends Banker Meetings: Never    Marital Status: Married    Allergies:  Allergies  Allergen Reactions   Acetaminophen Hives, Nausea Only and Rash   Codeine Nausea Only    Metabolic Disorder Labs: No results found for: "HGBA1C", "MPG" No results found for: "PROLACTIN" Lab Results  Component Value Date   CHOL  01/03/2009    147        ATP III CLASSIFICATION:  <200     mg/dL   Desirable  277-824  mg/dL   Borderline High  >=235    mg/dL   High          TRIG 361 01/03/2009   HDL 19 (L) 01/03/2009   CHOLHDL 7.7 01/03/2009   VLDL 28 01/03/2009   LDLCALC (H) 01/03/2009    100        Total Cholesterol/HDL:CHD Risk Coronary Heart Disease Risk Table                     Men   Women  1/2 Average Risk   3.4   3.3  Average Risk       5.0   4.4  2 X Average Risk   9.6   7.1  3 X Average Risk  23.4   11.0        Use the calculated Patient Ratio above and the CHD Risk Table to determine the patient's CHD Risk.        ATP III CLASSIFICATION (LDL):  <100     mg/dL   Optimal  443-154  mg/dL   Near or Above                    Optimal  130-159  mg/dL   Borderline  008-676  mg/dL   High  >195     mg/dL   Very High     Therapeutic Level Labs: No results found for: "LITHIUM" No results found for: "VALPROATE" No results found for: "CBMZ"  Current Medications: Current Outpatient Medications  Medication Sig Dispense Refill   clonazePAM (KLONOPIN) 0.5 MG tablet Take 1 tablet (0.5 mg total) by mouth daily. 30 tablet  0   lamoTRIgine (LAMICTAL) 25 MG tablet Take 1 tablet (25 mg total) by mouth daily. Take 25 mg x 14 days then increase to 50mg  42 tablet 0    clonazePAM (KLONOPIN) 1 MG tablet Take 1 tablet (1 mg total) by mouth at bedtime. 30 tablet 0   escitalopram (LEXAPRO) 20 MG tablet Take 2 tablets (40 mg total) by mouth daily. 180 tablet 1   nicotine (NICODERM CQ - DOSED IN MG/24 HOURS) 21 mg/24hr patch Place 1 patch (21 mg total) onto the skin daily. 28 patch 0   risperiDONE (RISPERDAL) 1 MG tablet Take 1 tablet (1 mg total) by mouth daily. 30 tablet 2   risperiDONE (RISPERDAL) 2 MG tablet Take 1 tablet (2 mg total) by mouth at bedtime. 30 tablet 2   thiamine 100 MG tablet Take 1 tablet (100 mg total) by mouth daily. 30 tablet 0   traZODone (DESYREL) 150 MG tablet TAKE 3 TABLETS(450 MG) BY MOUTH AT BEDTIME 90 tablet 1   No current facility-administered medications for this visit.     Musculoskeletal: Deferred Psychiatric Specialty Exam: Review of Systems  Psychiatric/Behavioral:  Positive for agitation, dysphoric mood and hallucinations. Negative for suicidal ideas.     There were no vitals taken for this visit.There is no height or weight on file to calculate BMI.  General Appearance: NA  Eye Contact:  NA  Speech:  Clear and Coherent  Volume:  Normal  Mood:  Irritable  Affect:  NA  Thought Process:  Linear  Orientation:  Full (Time, Place, and Person)  Thought Content: Rumination   Suicidal Thoughts:  No  Homicidal Thoughts:  No  Memory:  Immediate;   Fair  Judgement:  Poor  Insight:  Lacking  Psychomotor Activity:  NA  Concentration:  Concentration: Fair  Recall:  NA  Fund of Knowledge: Poor  Language: Fair  Akathisia:  Negative  Handed:    AIMS (if indicated): not done  Assets:  Communication Skills Housing Resilience  ADL's:  Intact  Cognition: WNL  Sleep:  Fair   Screenings: GAD-7    Flowsheet Row [facility]  [facility]  Total GAD-7 Score 16 10 16 17 20       PHQ2-9    Flowsheet Row [facility]  PHQ-2 Total Score 5 2 2 2 2   PHQ-9 Total Score 8 9 10 17 16       Flowsheet Row [facility]  C-SSRS RISK CATEGORY Low Risk Low Risk Low Risk        Assessment and Plan: Ashley FortsStacy Rios is a 51 year old female with a past psychiatric history significant for attention deficit hyperactivity disorder, insomnia, PTSD, schizoaffective disorder (bipolar type), and panic attacks [facility] telephone visit for follow-up and medication  management.  During assessment today patient endorsed some, newly conflicting statements.  Patient endorsed that she felt her antipsychotic medication was worsening her auditory hallucinations which there is a very low chance of this being true.  Interestingly, when provider consider decreasing Risperdal, to address patient's increased risk for metabolic syndrome on this medication, the patient did not want to change her Risperdal.  At this time patient schizoaffective diagnosis will remain however, this provider has some concern that patient may not have bipolar type.  Patient is endorsing chronic auditory hallucinations and paranoia but seems to only really have  depressive symptoms.  Patient's word choice does make it a bit difficult to assess whether or not she is having true hypomanic episodes versus emotional lability and poor coping skills.  Also emphasized to patient that due to the complexity of her case, and having a new provider it is requested that she come in person for her next appointment, to which patient endorsed she would be willing.  At next appointment we will discuss with patient that her future appointments must be in person for a video visit especially with her Klonopin prescription.  Will be titrating patient off of Klonopin due to prolonged use, and symptoms of reliance on the highly addictive medication.  Patient will be started on Lamictal to address her depressive symptoms and mood instability.  Patient was educated on SJS and endorsed understanding and reported compliance with her previous medications decreasing concern that she will be at increased risk for this.  Schizoaffective disorder, bipolar type (R/O schizoaffective disorder, depressive type ES undiagnosed personality disorder) History of reported ADHD GAD with panic attacks - Continue Lexapro 40 mg, will likely titrate down due to concern for excessive serotonergic exposure - Decrease Klonopin to 0.5 mg in the a.m. and 1 mg nightly, will only provided 30-day supply at this time - Continue trazodone 450 mg nightly - Continue Risperdal 1 mg in the a.m. and 2 mg nightly - Start Lamictal 25 mg x 14 days then increase to 50 mg  Follow-up in approximately 1 month  Collaboration of Care: Collaboration of Care:   Patient/Guardian was advised Release of Information must be obtained prior to any record release in order to collaborate their care with an outside provider. Patient/Guardian was advised if they have not already done so to contact the registration department to sign all necessary forms in order for [place] to release information regarding their care.   Consent:  Patient/Guardian gives verbal consent for treatment and assignment of benefits for services provided during this visit. Patient/Guardian expressed understanding and agreed to proceed.   PGY-3 Bobbye Morton, MD 06/28/2022, 12:03 PM

## 2022-06-30 ENCOUNTER — Telehealth (HOSPITAL_COMMUNITY): Payer: Self-pay

## 2022-07-17 ENCOUNTER — Other Ambulatory Visit: Payer: Self-pay

## 2022-07-19 ENCOUNTER — Telehealth (HOSPITAL_COMMUNITY): Payer: Self-pay

## 2022-07-19 ENCOUNTER — Other Ambulatory Visit (HOSPITAL_COMMUNITY): Payer: Self-pay | Admitting: Student in an Organized Health Care Education/Training Program

## 2022-07-19 DIAGNOSIS — F250 Schizoaffective disorder, bipolar type: Secondary | ICD-10-CM

## 2022-07-19 MED ORDER — LAMOTRIGINE 25 MG PO TABS: 50.0000 mg | ORAL_TABLET | Freq: Every day | ORAL | 1 refills | Status: DC

## 2022-07-19 NOTE — Telephone Encounter (Signed)
Denied patient should not need at this time

## 2022-07-19 NOTE — Telephone Encounter (Signed)
Sent!

## 2022-07-26 ENCOUNTER — Telehealth (HOSPITAL_COMMUNITY): Payer: Self-pay

## 2022-07-26 ENCOUNTER — Other Ambulatory Visit (HOSPITAL_COMMUNITY): Payer: Self-pay | Admitting: Student in an Organized Health Care Education/Training Program

## 2022-07-26 ENCOUNTER — Other Ambulatory Visit (HOSPITAL_COMMUNITY): Payer: Self-pay | Admitting: Registered Nurse

## 2022-07-26 DIAGNOSIS — F410 Panic disorder [episodic paroxysmal anxiety] without agoraphobia: Secondary | ICD-10-CM

## 2022-07-26 MED ORDER — CLONAZEPAM 1 MG PO TABS: 1.0000 mg | ORAL_TABLET | Freq: Every day | ORAL | 0 refills | Status: DC

## 2022-07-26 MED ORDER — CLONAZEPAM 0.5 MG PO TABS: 0.5000 mg | ORAL_TABLET | Freq: Every day | ORAL | 0 refills | Status: DC

## 2022-07-26 NOTE — Telephone Encounter (Signed)
Medication management - Telephone call with patient, after she left a message, that she is in need of new orders for her Clonazepam 0.5 mg and 1 mg dosages, last ordered 06/28/22 and pt returns next on 08/07/22. Patient requests these new orders be sent in to her [facility] on [address].

## 2022-07-26 NOTE — Progress Notes (Signed)
Patient has called multiple times, despite it being a holiday endorsing that she needs her Klonopin.  Patient is 2 days away from running out per last prescription filled.  This provider will only provide a 10-day supply she has a follow-up in 08/07/2021 and is currently being titrated down on Klonopin.  PGY-3 Eliseo Gum, MD

## 2022-08-02 ENCOUNTER — Telehealth (HOSPITAL_COMMUNITY): Payer: Self-pay | Admitting: Student in an Organized Health Care Education/Training Program

## 2022-08-02 ENCOUNTER — Telehealth (HOSPITAL_COMMUNITY): Payer: Self-pay | Admitting: *Deleted

## 2022-08-02 NOTE — Telephone Encounter (Signed)
Called several times this am for an explanation as to why the provider only called in 10 pills of her Klonopin. She would like to speak with Joyce Gross MD her provider. I will forward this request to her.

## 2022-08-02 NOTE — Telephone Encounter (Signed)
She will be seen on 08/07/2022, that is why. We are tapering and she will not be provided more than needed.

## 2022-08-02 NOTE — Telephone Encounter (Signed)
Spoke with patient over phone reporting that her medications were correct and that she only received a 10 day supply as this would be enough to get her to her next appt. Also reminded patient that the goal/ plan remains to titrate her off of her Klonopin over the next few months. Patient eventually endorsed understanding. Also spoke with patient about her upcoming visit being video visit, patient endorsed having MyChart. Provider explained how a video visit would work, and patient endorsed she would make sure to have a basic understanding of her account by the day of her visit.   PGY-3 Damita Dunnings, MD

## 2022-08-07 ENCOUNTER — Telehealth (INDEPENDENT_AMBULATORY_CARE_PROVIDER_SITE_OTHER): Payer: No Payment, Other | Admitting: Student in an Organized Health Care Education/Training Program

## 2022-08-07 ENCOUNTER — Other Ambulatory Visit: Payer: Self-pay

## 2022-08-07 ENCOUNTER — Encounter (HOSPITAL_COMMUNITY): Payer: Self-pay | Admitting: Student in an Organized Health Care Education/Training Program

## 2022-08-07 DIAGNOSIS — F410 Panic disorder [episodic paroxysmal anxiety] without agoraphobia: Secondary | ICD-10-CM

## 2022-08-07 DIAGNOSIS — F4310 Post-traumatic stress disorder, unspecified: Secondary | ICD-10-CM

## 2022-08-07 DIAGNOSIS — F250 Schizoaffective disorder, bipolar type: Secondary | ICD-10-CM

## 2022-08-07 DIAGNOSIS — F5105 Insomnia due to other mental disorder: Secondary | ICD-10-CM | POA: Diagnosis not present

## 2022-08-07 DIAGNOSIS — F99 Mental disorder, not otherwise specified: Secondary | ICD-10-CM

## 2022-08-07 MED ORDER — ARIPIPRAZOLE 10 MG PO TABS
10.0000 mg | ORAL_TABLET | Freq: Every day | ORAL | 2 refills | Status: DC
  Filled 2022-08-07: qty 30, 30d supply, fill #0
  Filled 2022-09-09: qty 30, 30d supply, fill #1

## 2022-08-07 MED ORDER — RISPERIDONE 2 MG PO TABS
2.0000 mg | ORAL_TABLET | Freq: Every day | ORAL | 2 refills | Status: DC
  Filled 2022-08-07 – 2022-09-09 (×2): qty 30, 30d supply, fill #0

## 2022-08-07 MED ORDER — ESCITALOPRAM OXALATE 20 MG PO TABS
40.0000 mg | ORAL_TABLET | Freq: Every day | ORAL | 1 refills | Status: DC
  Filled 2022-08-07: qty 60, 30d supply, fill #0
  Filled 2022-09-09 – 2022-09-25 (×2): qty 60, 30d supply, fill #1

## 2022-08-07 MED ORDER — RISPERIDONE 1 MG PO TABS
1.0000 mg | ORAL_TABLET | Freq: Every day | ORAL | 2 refills | Status: DC
  Filled 2022-08-07 – 2022-09-09 (×2): qty 30, 30d supply, fill #0

## 2022-08-07 MED ORDER — CLONAZEPAM 0.5 MG PO TABS: 0.5000 mg | ORAL_TABLET | Freq: Two times a day (BID) | ORAL | 0 refills | Status: DC

## 2022-08-07 NOTE — Patient Instructions (Signed)
Lamictal  Go back to 25mg  daily then STOP  Risperdal Stop Risperdal  Start Abilify 10mg   Klonopin decrease 0.5mg  2x/ day

## 2022-08-07 NOTE — Progress Notes (Signed)
[place] MD/PA/NP OP Progress Note  08/07/2022 5:16 PM [name]  MRN:  [id number]  Chief Complaint:  Chief Complaint  Patient presents with   Follow-up   Virtual Visit via Video Note  I connected with [name] on 08/07/22 at 11:30 AM EST by a video enabled telemedicine application and verified that I am speaking with the correct person using two identifiers.  Location: Patient: Home Provider: Office   I discussed the limitations of evaluation and management by telemedicine and the availability of in person appointments. The patient expressed understanding and agreed to proceed.  History of Present Illness:  [name] is a 52 year old female with a past psychiatric history significant for attention deficit hyperactivity disorder, insomnia, PTSD, schizoaffective disorder (bipolar type), and panic attacks  and PMH of Wolff- Parkinson- White.  Patient reports she has been compliant with the following medication regimen:  - Lexapro 40 mg - Klonopin to 0.5 mg in the a.m. and 1 mg nightly, -trazodone 450 mg nightly - Risperdal 1 mg in the a.m. and 2 mg nightly -  Lamictal 25 mg x 14 days then increase to 50 mg   Patient reports that when she started the Lamictal, felt some improvement in mood but would crash. Patient reports that when she went up to 50mg  she dyed her hair purple appears she did not bleach it, and her hair still appears normal. Patient reports that she feels like she is dwelling more on thoughts and is less impulsive but she does not act on impulse.Patient reports that this can even prevent her from her doing her daily tasks. Patient reports that she is essentially procrastinating on this medication.  Patient reports she is really struggling with motivations. Patient reports that her anxiety is worsening, she is more restless, and overwhelmed more.  Patient reports "I wonder if I am being overmedicated or something."  Provider and patient did discuss that, patient has multiple  medications that can be over targeting her serotonin receptors which could explain her response suggesting that she may have been pushed into a slight hypomanic episode and that she should expect further medication adjustments.  Patient indicated understanding.   Patient reports that her irritability is normal not worsened. Patient reports that her sleep is not balanced and she is having frequent night time awakenings. Patient reports that she does think that trazodone helps her fall asleep.  Patient reports she really is concerned about her lack of motivation and her ruminations. Patient endorses that she is feeling unbalanced.  Patient reports that she occasionally hears voices, thinks she she will hear someone talking about her outside of her head, but she knows that it is likely not really likely that they are talking about her. Patient reports that she does constantly feel that this worsens this paranoia. Patient endorses that it is intrusive thoughts at times.   Patient denies SI, HI, VH but endorses AH (whispers that she can't understand) and paranoia.   I discussed the assessment and treatment plan with the patient. The patient was provided an opportunity to ask questions and all were answered. The patient agreed with the plan and demonstrated an understanding of the instructions.   The patient was advised to call back or seek an in-person evaluation if the symptoms worsen or if the condition fails to improve as anticipated.  I provided 25 minutes of non-face-to-face time during this encounter.   Ashley Busman, MD  Visit Diagnosis:    ICD-10-CM   1. Schizoaffective disorder, bipolar  type ([place])  F25.0 ARIPiprazole (ABILIFY) 10 MG tablet    risperiDONE (RISPERDAL) 2 MG tablet    risperiDONE (RISPERDAL) 1 MG tablet    escitalopram (LEXAPRO) 20 MG tablet    2. Panic attacks  F41.0 clonazePAM (KLONOPIN) 0.5 MG tablet    escitalopram (LEXAPRO) 20 MG tablet    3. PTSD (post-traumatic  stress disorder)  F43.10 escitalopram (LEXAPRO) 20 MG tablet    4. Insomnia due to other mental disorder  F51.05 escitalopram (LEXAPRO) 20 MG tablet   F99       Past Psychiatric History:  Schizoaffective, bipolar type Insomnia Anxiety PTSD Panic attacks Attention deficit hyperactivity disorder  Last visit 05/2022: Patient endorses auditory hallucinations as well as dysphoric mood and labile mood.  Patient was started on Lamictal.  Patient is also being titrated off her Klonopin.  Past Medical History:  Past Medical History:  Diagnosis Date   Alcohol abuse    Anxiety    Arthritis    Bipolar disorder ([place])    Depression    GERD (gastroesophageal reflux disease)    H/O hiatal hernia    Hemorrhoids    Small bowel obstruction ([place]) 2012   Wolf-Parkinson-White syndrome    Wolf-Parkinson-White syndrome    per patient    Past Surgical History:  Procedure Laterality Date   ESOPHAGOGASTRODUODENOSCOPY  06/07/2011   Procedure: ESOPHAGOGASTRODUODENOSCOPY (EGD);  Surgeon: Owens Loffler, MD;  Location: [facility];  Service: Endoscopy;  Laterality: N/A;   TUBAL LIGATION      Family Psychiatric History:  Grandfather's sister has schizophrenia Grandmother's brother has a mental illness of some sort  Family History:  Family History  Problem Relation Age of Onset   Breast cancer Maternal Aunt    Colon polyps Maternal Grandmother    Diabetes Maternal Grandmother    Heart attack Maternal Grandmother    Kidney cancer Maternal Grandmother    Breast cancer Maternal Grandmother    Colon cancer Neg Hx    Anesthesia problems Neg Hx    Hypotension Neg Hx    Malignant hyperthermia Neg Hx    Pseudochol deficiency Neg Hx    Heart disease Father    Heart disease Paternal Grandfather    Heart disease Paternal Grandmother     Social History:  Social History   Socioeconomic History   Marital status: Single    Spouse name: Not on file   Number of children: 4   Years of education: Not  on file   Highest education level: Not on file  Occupational History   Occupation: house wife  Tobacco Use   Smoking status: Every Day    Packs/day: 1.00    Types: Cigarettes   Smokeless tobacco: Never  Substance and Sexual Activity   Alcohol use: No    Comment: previous alcohol abuse but for last 2 weeks 2 drinks. not currently drinking alcohol   Drug use: No   Sexual activity: Not Currently    Birth control/protection: None  Other Topics Concern   Not on file  Social History Narrative   Not on file   Social Determinants of Health   Financial Resource Strain: Medium Risk (05/27/2020)   Overall Financial Resource Strain (CARDIA)    Difficulty of Paying Living Expenses: Somewhat hard  Food Insecurity: No Food Insecurity (05/27/2020)   Hunger Vital Sign    Worried About Running Out of Food in the Last Year: Never true    Ran Out of Food in the Last Year: Never true  Transportation Needs:  No Transportation Needs (05/27/2020)   PRAPARE - Hydrologist (Medical): No    Lack of Transportation (Non-Medical): No  Physical Activity: Inactive (05/27/2020)   Exercise Vital Sign    Days of Exercise per Week: 0 days    Minutes of Exercise per Session: 0 min  Stress: Stress Concern Present (05/27/2020)   [name]    Feeling of Stress : Very much  Social Connections: Moderately Isolated (05/27/2020)   Social Connection and Isolation Panel [NHANES]    Frequency of Communication with Friends and Family: More than three times a week    Frequency of Social Gatherings with Friends and Family: Once a week    Attends Religious Services: Never    Marine scientist or Organizations: No    Attends Archivist Meetings: Never    Marital Status: Married    Allergies:  Allergies  Allergen Reactions   Acetaminophen Hives, Nausea Only and Rash   Codeine Nausea Only    Metabolic  Disorder Labs: No results found for: "HGBA1C", "MPG" No results found for: "PROLACTIN" Lab Results  Component Value Date   CHOL  01/03/2009    147        ATP III CLASSIFICATION:  <200     mg/dL   Desirable  200-239  mg/dL   Borderline High  >=240    mg/dL   High          TRIG 141 01/03/2009   HDL 19 (L) 01/03/2009   CHOLHDL 7.7 01/03/2009   VLDL 28 01/03/2009   LDLCALC (H) 01/03/2009    100        Total Cholesterol/HDL:CHD Risk Coronary Heart Disease Risk Table                     Men   Women  1/2 Average Risk   3.4   3.3  Average Risk       5.0   4.4  2 X Average Risk   9.6   7.1  3 X Average Risk  23.4   11.0        Use the calculated Patient Ratio above and the CHD Risk Table to determine the patient's CHD Risk.        ATP III CLASSIFICATION (LDL):  <100     mg/dL   Optimal  100-129  mg/dL   Near or Above                    Optimal  130-159  mg/dL   Borderline  160-189  mg/dL   High  >190     mg/dL   Very High    Therapeutic Level Labs: No results found for: "LITHIUM" No results found for: "VALPROATE" No results found for: "CBMZ"  Current Medications: Current Outpatient Medications  Medication Sig Dispense Refill   ARIPiprazole (ABILIFY) 10 MG tablet Take 1 tablet (10 mg total) by mouth daily. 30 tablet 2   clonazePAM (KLONOPIN) 0.5 MG tablet Take 1 tablet (0.5 mg total) by mouth 2 (two) times daily. 60 tablet 0   escitalopram (LEXAPRO) 20 MG tablet Take 2 tablets (40 mg total) by mouth daily. 180 tablet 1   lamoTRIgine (LAMICTAL) 25 MG tablet Take 2 tablets (50 mg total) by mouth daily. Take 25 mg x 14 days then increase to 50mg  60 tablet 1   nicotine (NICODERM CQ - DOSED IN MG/24 HOURS) 21  mg/24hr patch Place 1 patch (21 mg total) onto the skin daily. 28 patch 0   risperiDONE (RISPERDAL) 1 MG tablet Take 1 tablet (1 mg total) by mouth daily. 30 tablet 2   risperiDONE (RISPERDAL) 2 MG tablet Take 1 tablet (2 mg total) by mouth at bedtime. 30 tablet 2    thiamine 100 MG tablet Take 1 tablet (100 mg total) by mouth daily. 30 tablet 0   traZODone (DESYREL) 150 MG tablet TAKE 3 TABLETS(450 MG) BY MOUTH AT BEDTIME 90 tablet 1   No current facility-administered medications for this visit.     Musculoskeletal: defer  Psychiatric Specialty Exam: Review of Systems  Psychiatric/Behavioral:  Positive for hallucinations. Negative for suicidal ideas. The patient is nervous/anxious.     There were no vitals taken for this visit.There is no height or weight on file to calculate BMI.  General Appearance: Casual  Eye Contact:  Good  Speech:  Clear and Coherent  Volume:  Normal  Mood:  Euthymic  Affect:  Appropriate  Thought Process:  Goal Directed  Orientation:  Full (Time, Place, and Person)  Thought Content: Logical   Suicidal Thoughts:  No  Homicidal Thoughts:  No  Memory:  Immediate;   Fair Recent;   Fair  Judgement:  Other:  Improving  Insight:   improving  Psychomotor Activity:  NA  Concentration:  Concentration: Fair  Recall:  NA  Fund of Knowledge: Fair  Language: Fair  Akathisia:  No  Handed:    AIMS (if indicated): not done  Assets:  Communication Skills Desire for Improvement Housing Resilience  ADL's:  Intact  Cognition: WNL  Sleep:  Poor   Screenings: GAD-7    Flowsheet Row Video Visit from 03/15/2022 in [facility] Video Visit from 01/04/2022 in [facility] Video Visit from 10/04/2021 in [facility] Video Visit from 08/08/2021 in [facility] Video Visit from 06/14/2021 in [facility]  Total GAD-7 Score 16 10 16 17 20       PHQ2-9    Flowsheet Row Video Visit from 03/15/2022 in [facility] Video Visit from 01/04/2022 in [facility] Video Visit from 10/04/2021 in [facility] Video Visit from  08/08/2021 in [facility] Video Visit from 06/14/2021 in [facility]  PHQ-2 Total Score 5 2 2 2 2   PHQ-9 Total Score 8 9 10 17 16       Flowsheet Row Video Visit from 03/15/2022 in [facility] Video Visit from 01/04/2022 in [facility] Video Visit from 10/04/2021 in [facility]  C-SSRS RISK CATEGORY Low Risk Low Risk Low Risk        Assessment and Plan:  Emillia Weatherly is a 52 year old female with a past psychiatric history significant for attention deficit hyperactivity disorder, insomnia, PTSD, schizoaffective disorder (bipolar type), and panic attacks  and PMH of Wolff- Parkinson- White.   Based on assessment today patient continues to endorse symptoms of paranoia.  Patient also appears to have responded poorly to Lamictal endorsing what sounds similar to hypomanic symptoms.  Patient herself had some decent insight into that she may be "overmedicated" and this is likely the case as patient serotonin receptors may be overstimulated with her Lexapro and trazodone in combination with Risperdal and Lamictal.  Patient also appeared to not be getting much benefit  from her Risperdal but was able to recall being on Abilify in the past and doing fairly well.  Abilify is also one of the more recommended SGA's in patients with a history of WPW.  It was discussed with the patient in the future after her Klonopin taper is discontinued, would like to decrease her Lexapro and trazodone as well to address the overstimulation as serotonin theory.  Patient was very pleasant and onboard with these ideas.  Decreasing patient's Lamictal due to poor response and transitioning patient to Abilify to address continued psychosis symptoms as well as anxiety.  Schizoaffective disorder, bipolar type History of reported ADHD - Discontinue Risperdal 1 mg in the a.m. and 2 mg nightly - Start  Abilify 10 mg daily - Decrease Lamictal to 25 mg daily x 1 week then discontinue - Continue trazodone 450 mg nightly - Continue Lexapro 40 mg daily  GAD W/panic attacks - Lexapro per above - Decrease Klonopin to 0.5 mg twice daily x 30 days will continue taper to 0.25 in the AM and 0.5 in the PM at next visit  Follow-up in approximately 1 month  Collaboration of Care: Collaboration of Care:   Patient/Guardian was advised Release of Information must be obtained prior to any record release in order to collaborate their care with an outside provider. Patient/Guardian was advised if they have not already done so to contact the registration department to sign all necessary forms in order for [place] to release information regarding their care.   Consent: Patient/Guardian gives verbal consent for treatment and assignment of benefits for services provided during this visit. Patient/Guardian expressed understanding and agreed to proceed.   PGY-3 Ashley Busman, MD 08/07/2022, 5:16 PM

## 2022-08-08 ENCOUNTER — Encounter (HOSPITAL_COMMUNITY): Payer: Self-pay

## 2022-08-08 ENCOUNTER — Other Ambulatory Visit (HOSPITAL_COMMUNITY): Payer: Self-pay

## 2022-08-08 ENCOUNTER — Other Ambulatory Visit: Payer: Self-pay

## 2022-09-05 ENCOUNTER — Telehealth (HOSPITAL_COMMUNITY): Payer: Self-pay | Admitting: *Deleted

## 2022-09-05 NOTE — Telephone Encounter (Signed)
Patient called left a voicemail asking for refill of Klonopin. Upon chart review no future appointments scheduled.

## 2022-09-06 NOTE — Telephone Encounter (Signed)
Could we please get her a f/u appt?

## 2022-09-09 ENCOUNTER — Other Ambulatory Visit (HOSPITAL_COMMUNITY): Payer: Self-pay

## 2022-09-10 ENCOUNTER — Other Ambulatory Visit: Payer: Self-pay

## 2022-09-10 ENCOUNTER — Other Ambulatory Visit (HOSPITAL_COMMUNITY): Payer: Self-pay | Admitting: Student in an Organized Health Care Education/Training Program

## 2022-09-10 ENCOUNTER — Telehealth (HOSPITAL_COMMUNITY): Payer: Self-pay

## 2022-09-10 DIAGNOSIS — F410 Panic disorder [episodic paroxysmal anxiety] without agoraphobia: Secondary | ICD-10-CM

## 2022-09-10 MED ORDER — CLONAZEPAM 0.5 MG PO TABS: 0.5000 mg | ORAL_TABLET | Freq: Two times a day (BID) | ORAL | 0 refills | Status: DC

## 2022-09-10 NOTE — Progress Notes (Signed)
   Per clinic staff patient called multiple times requesting Klonopin, despite being told that her incessant calling was not appropriate and that she did not schedule a future visit with provider.  Per clinic staff, patient was initially not willing to follow rules dictating that she must either have an in-person or video visit, however she later complied.  Patient will receive a refill of her Klonopin at 0.5 mg twice daily until next appointment.  Provider will create a behavior plan for patient, as her behavior is not appropriate.  Patient will be continued on Klonopin downward titration at next appointment.  PGY-3 Damita Dunnings, MD

## 2022-09-10 NOTE — Telephone Encounter (Signed)
She needs to make an appt, I said this last week. No refill without appt

## 2022-09-17 ENCOUNTER — Other Ambulatory Visit: Payer: Self-pay

## 2022-09-25 ENCOUNTER — Other Ambulatory Visit: Payer: Self-pay

## 2022-09-25 ENCOUNTER — Other Ambulatory Visit (HOSPITAL_COMMUNITY): Payer: Self-pay

## 2022-09-25 ENCOUNTER — Encounter (HOSPITAL_COMMUNITY): Payer: Self-pay

## 2022-09-28 ENCOUNTER — Telehealth (INDEPENDENT_AMBULATORY_CARE_PROVIDER_SITE_OTHER): Payer: No Payment, Other | Admitting: Student in an Organized Health Care Education/Training Program

## 2022-09-28 ENCOUNTER — Other Ambulatory Visit: Payer: Self-pay

## 2022-09-28 ENCOUNTER — Encounter (HOSPITAL_COMMUNITY): Payer: Self-pay | Admitting: Student in an Organized Health Care Education/Training Program

## 2022-09-28 DIAGNOSIS — F250 Schizoaffective disorder, bipolar type: Secondary | ICD-10-CM

## 2022-09-28 DIAGNOSIS — F410 Panic disorder [episodic paroxysmal anxiety] without agoraphobia: Secondary | ICD-10-CM

## 2022-09-28 DIAGNOSIS — F5105 Insomnia due to other mental disorder: Secondary | ICD-10-CM | POA: Diagnosis not present

## 2022-09-28 DIAGNOSIS — F4310 Post-traumatic stress disorder, unspecified: Secondary | ICD-10-CM | POA: Diagnosis not present

## 2022-09-28 DIAGNOSIS — F99 Mental disorder, not otherwise specified: Secondary | ICD-10-CM

## 2022-09-28 MED ORDER — CLONAZEPAM 0.5 MG PO TABS: 0.5000 mg | ORAL_TABLET | Freq: Every day | ORAL | 0 refills | Status: DC

## 2022-09-28 MED ORDER — ESCITALOPRAM OXALATE 20 MG PO TABS: 30.0000 mg | ORAL_TABLET | Freq: Every day | ORAL | 1 refills | Status: DC

## 2022-09-28 MED ORDER — ARIPIPRAZOLE 15 MG PO TABS: 15.0000 mg | ORAL_TABLET | Freq: Every day | ORAL | 1 refills | Status: DC

## 2022-09-28 MED ORDER — CLONAZEPAM 0.25 MG PO TBDP: 0.2500 mg | ORAL_TABLET | Freq: Every day | ORAL | 0 refills | Status: DC

## 2022-09-28 MED ORDER — TRAZODONE HCL 150 MG PO TABS: ORAL_TABLET | ORAL | 1 refills | Status: DC

## 2022-09-28 NOTE — Progress Notes (Signed)
[place] MD/PA/NP OP Progress Note  09/28/2022 3:17 PM [name]  MRN:  [id number]  Chief Complaint:  Chief Complaint  Patient presents with   Follow-up   Virtual Visit via Video Note  I connected with [name] on 09/28/22 at 10:00 AM EST by a video enabled telemedicine application and verified that I am speaking with the correct person using two identifiers.  Location: Patient: Home Provider: Office   I discussed the limitations of evaluation and management by telemedicine and the availability of in person appointments. The patient expressed understanding and agreed to proceed.  History of Present Illness: [name] is a 52 year old female with a past psychiatric history significant for attention deficit hyperactivity disorder, insomnia, PTSD, schizoaffective disorder (bipolar type), and panic attacks  and PMH of Wolff- Parkinson- White.  Patient reports she has been compliant with the following medication regimen:    Abilify 10 mg daily   trazodone 450 mg nightly Lexapro 40 mg daily 0.5 mg twice daily x 30 days   Patient reports that she has been compliant with her medications, and that she really likes the Abilify. Patient reports that she thinks the Abilify hit a plateau, despite initially feeling better when she started taking. Patient reports that she has been feeling more depressed the last month and has been struggling with staying asleep at night, and is having frequent night time awakenings. Patient reports that she is eating well. Patient reports that her energy level is low. Patient reports that her concentration is very poor. Patient reports that she feels is struggling with feeling overwhelmed, endorsing "it may be over nothing." Patient reports that she feels like she is worrying all the time. Patient reports she is having "panic attacks" at night. Patient denies identifying triggers, other than her thoughts are ruminating. Patient reports that when she has a panic attack  she feels like her throat is closing and has tachypnea.   Patient reports that she feels a bit overwhelmed taking care of her sick dad. Patient reports that she can be irritable. Patient reports that she is working on negating thoughts that she is at fault for not being perfect or knowing "everything." Patient denies SI, HI, and VH. Patient reports that she still is having AH, and she will talk back to these negative voices. Patient reports that the voices are outside her head, but they appears to be watching and commenting on everything she does.   Manic episodes- racing thoughts, no sleep, approx 2-3 days at their worse Etoh use: denies THC: denies Tobacco: 1ppd  I discussed the assessment and treatment plan with the patient. The patient was provided an opportunity to ask questions and all were answered. The patient agreed with the plan and demonstrated an understanding of the instructions.   The patient was advised to call back or seek an in-person evaluation if the symptoms worsen or if the condition fails to improve as anticipated.  I provided 25 minutes of non-face-to-face time during this encounter.   Freida Busman, MD  Visit Diagnosis:    ICD-10-CM   1. Schizoaffective disorder, bipolar type ([place])  F25.0 escitalopram (LEXAPRO) 20 MG tablet    ARIPiprazole (ABILIFY) 15 MG tablet    2. Panic attacks  F41.0 escitalopram (LEXAPRO) 20 MG tablet    clonazePAM (KLONOPIN) 0.5 MG tablet    clonazePAM (KLONOPIN) 0.25 MG disintegrating tablet    3. PTSD (post-traumatic stress disorder)  F43.10 escitalopram (LEXAPRO) 20 MG tablet    4. Insomnia due to  other mental disorder  F51.05 escitalopram (LEXAPRO) 20 MG tablet   F99 traZODone (DESYREL) 150 MG tablet      Past Psychiatric History:  Schizoaffective, bipolar type Insomnia Anxiety PTSD Panic attacks Attention deficit hyperactivity disorder   Last visit 05/2022: Patient endorses auditory hallucinations as well as dysphoric mood  and labile mood.  Patient was started on Lamictal.  Patient is also being titrated off her Klonopin.  07/2022-patient felt that risperidone was not helping her and wanted to go back to Abilify for mood stabilization auditory hallucinations, patient's risperidone was discontinued and Abilify was started at 10 mg.  Patient's Lamictal was also discontinued due to poor response, and patient was already going to be on Abilify which would help with mood stabilization.  Patient endorsed feeling a bit "overmedicated" during this visit.  Patient's Klonopin was titrated down to 0.5 mg twice daily.  Past Medical History:  Past Medical History:  Diagnosis Date   Alcohol abuse    Anxiety    Arthritis    Bipolar disorder ([place])    Depression    GERD (gastroesophageal reflux disease)    H/O hiatal hernia    Hemorrhoids    Small bowel obstruction ([place]) 2012   Wolf-Parkinson-White syndrome    Wolf-Parkinson-White syndrome    per patient    Past Surgical History:  Procedure Laterality Date   ESOPHAGOGASTRODUODENOSCOPY  06/07/2011   Procedure: ESOPHAGOGASTRODUODENOSCOPY (EGD);  Surgeon: Owens Loffler, MD;  Location: [facility];  Service: Endoscopy;  Laterality: N/A;   TUBAL LIGATION      Family Psychiatric History: Grandfather's sister has schizophrenia Grandmother's brother has a mental illness of some sort  Family History:  Family History  Problem Relation Age of Onset   Breast cancer Maternal Aunt    Colon polyps Maternal Grandmother    Diabetes Maternal Grandmother    Heart attack Maternal Grandmother    Kidney cancer Maternal Grandmother    Breast cancer Maternal Grandmother    Colon cancer Neg Hx    Anesthesia problems Neg Hx    Hypotension Neg Hx    Malignant hyperthermia Neg Hx    Pseudochol deficiency Neg Hx    Heart disease Father    Heart disease Paternal Grandfather    Heart disease Paternal Grandmother     Social History:  Social History   Socioeconomic History   Marital  status: Single    Spouse name: Not on file   Number of children: 4   Years of education: Not on file   Highest education level: Not on file  Occupational History   Occupation: house wife  Tobacco Use   Smoking status: Every Day    Packs/day: 1.00    Types: Cigarettes   Smokeless tobacco: Never  Substance and Sexual Activity   Alcohol use: No    Comment: previous alcohol abuse but for last 2 weeks 2 drinks. not currently drinking alcohol   Drug use: No   Sexual activity: Not Currently    Birth control/protection: None  Other Topics Concern   Not on file  Social History Narrative   Not on file   Social Determinants of Health   Financial Resource Strain: Medium Risk (05/27/2020)   Overall Financial Resource Strain (CARDIA)    Difficulty of Paying Living Expenses: Somewhat hard  Food Insecurity: No Food Insecurity (05/27/2020)   Hunger Vital Sign    Worried About Running Out of Food in the Last Year: Never true    Ran Out of Food in the Last  Year: Never true  Transportation Needs: No Transportation Needs (05/27/2020)   PRAPARE - Hydrologist (Medical): No    Lack of Transportation (Non-Medical): No  Physical Activity: Inactive (05/27/2020)   Exercise Vital Sign    Days of Exercise per Week: 0 days    Minutes of Exercise per Session: 0 min  Stress: Stress Concern Present (05/27/2020)   [place]    Feeling of Stress : Very much  Social Connections: Moderately Isolated (05/27/2020)   Social Connection and Isolation Panel [NHANES]    Frequency of Communication with Friends and Family: More than three times a week    Frequency of Social Gatherings with Friends and Family: Once a week    Attends Religious Services: Never    Marine scientist or Organizations: No    Attends Archivist Meetings: Never    Marital Status: Married    Allergies:  Allergies  Allergen  Reactions   Acetaminophen Hives, Nausea Only and Rash   Codeine Nausea Only    Metabolic Disorder Labs: No results found for: "HGBA1C", "MPG" No results found for: "PROLACTIN" Lab Results  Component Value Date   CHOL  01/03/2009    147        ATP III CLASSIFICATION:  <200     mg/dL   Desirable  200-239  mg/dL   Borderline High  >=240    mg/dL   High          TRIG 141 01/03/2009   HDL 19 (L) 01/03/2009   CHOLHDL 7.7 01/03/2009   VLDL 28 01/03/2009   LDLCALC (H) 01/03/2009    100        Total Cholesterol/HDL:CHD Risk Coronary Heart Disease Risk Table                     Men   Women  1/2 Average Risk   3.4   3.3  Average Risk       5.0   4.4  2 X Average Risk   9.6   7.1  3 X Average Risk  23.4   11.0        Use the calculated Patient Ratio above and the CHD Risk Table to determine the patient's CHD Risk.        ATP III CLASSIFICATION (LDL):  <100     mg/dL   Optimal  100-129  mg/dL   Near or Above                    Optimal  130-159  mg/dL   Borderline  160-189  mg/dL   High  >190     mg/dL   Very High    Therapeutic Level Labs: No results found for: "LITHIUM" No results found for: "VALPROATE" No results found for: "CBMZ"  Current Medications: Current Outpatient Medications  Medication Sig Dispense Refill   clonazePAM (KLONOPIN) 0.25 MG disintegrating tablet Take 1 tablet (0.25 mg total) by mouth daily. 35 tablet 0   ARIPiprazole (ABILIFY) 15 MG tablet Take 1 tablet (15 mg total) by mouth daily. 90 tablet 1   clonazePAM (KLONOPIN) 0.5 MG tablet Take 1 tablet (0.5 mg total) by mouth at bedtime. 35 tablet 0   escitalopram (LEXAPRO) 20 MG tablet Take 1.5 tablets (30 mg total) by mouth daily. 135 tablet 1   nicotine (NICODERM CQ - DOSED IN MG/24 HOURS) 21 mg/24hr patch Place 1 patch (  21 mg total) onto the skin daily. 28 patch 0   thiamine 100 MG tablet Take 1 tablet (100 mg total) by mouth daily. 30 tablet 0   traZODone (DESYREL) 150 MG tablet TAKE 3 TABLETS(450  MG) BY MOUTH AT BEDTIME 270 tablet 1   No current facility-administered medications for this visit.      Psychiatric Specialty Exam: Review of Systems  Psychiatric/Behavioral:  Positive for hallucinations and sleep disturbance. Negative for dysphoric mood and suicidal ideas. The patient is nervous/anxious.     There were no vitals taken for this visit.There is no height or weight on file to calculate BMI.  General Appearance: Casual  Eye Contact:  Good  Speech:  Clear and Coherent  Volume:  Normal  Mood:  Euthymic  Affect:  Appropriate and Congruent  Thought Process:  Goal Directed  Orientation:  Full (Time, Place, and Person)  Thought Content: Logical   Suicidal Thoughts:  No  Homicidal Thoughts:  No  Memory:  Immediate;   Good Recent;   Good  Judgement:  Poor  Insight:  Lacking  Psychomotor Activity:  NA  Concentration:  Concentration: Fair  Recall:  NA  Fund of Knowledge: Good  Language: Good  Akathisia:  NA  Handed:    AIMS (if indicated): not done  Assets:  Resilience  ADL's:  Intact  Cognition: WNL  Sleep:  Poor   Screenings: GAD-7    Flowsheet Row Video Visit from 03/15/2022 in [facility] Video Visit from 01/04/2022 in [facility] Video Visit from 10/04/2021 in [facility] Video Visit from 08/08/2021 in [facility] Video Visit from 06/14/2021 in [facility]  Total GAD-7 Score '16 10 16 17 20      '$ PHQ2-9    Flowsheet Row Video Visit from 03/15/2022 in [facility] Video Visit from 01/04/2022 in [facility] Video Visit from 10/04/2021 in [facility] Video Visit from 08/08/2021 in [facility] Video Visit from 06/14/2021 in [place] [facility]  PHQ-2 Total Score '5 2 2 2 2  '$ PHQ-9 Total Score '8 9 10  17 16      '$ Flowsheet Row Video Visit from 03/15/2022 in [facility] Video Visit from 01/04/2022 in [facility] Video Visit from 10/04/2021 in [place]        Assessment and Plan:  Onalee Steinmeyer is a 52 year old female with a past psychiatric history significant for attention deficit hyperactivity disorder, insomnia, PTSD, schizoaffective disorder (bipolar type), and panic attacks  and PMH of Wolff- Parkinson- White.    Patient appears to be fairly stable, but continues to endorse that her anxiety is a problem with her father's illness and that she is struggling with coping skills. Due to patient endorsing some insight into her poor coping skills, discussion was had regarding starting therapy, to which patient agreed. Will also increase patient's Abilify as she is endorsing that her thoughts are increasing and rapid (more at night), her sleep is decreasing, and her AH continue. Patient appears to have had some benefit and also initially found her mood had improved and stabilized , interestingly she felt it worsened after her father had a medical event. Patient continues to endorse reliance on Klonopin and was requesting it not be decreased, due  to her stressors with her father, at this time; however again that would only reinforce patient's poor coping. Klonopin downward titration will continue. Patient's Lexapro will be decreased, patient's serotonin receptors may be overstimulated with the Trazodone and Lexapro doses and could also be contributing to patient's feeling anxious, racing thoughts and poor sleep.   Schizoaffective disorder, bipolar type History of reported ADHD - Increase Abilify to '15mg'$  - Decrease Lexapro to '30mg'$  - Continue Trazodone '450mg'$  QHS  GAD W/panic attacks - Lexapro per above - Decrease Klonopin 0.25 in the AM and 0.5 in the PM , next appt  decrease to 0.'25mg'$  BID  F/U in 1 mon and will come in person for 2 mon f/u to get updated labs and vitals  Collaboration of Care: Collaboration of Care:   Patient/Guardian was advised Release of Information must be obtained prior to any record release in order to collaborate their care with an outside provider. Patient/Guardian was advised if they have not already done so to contact the registration department to sign all necessary forms in order for [place] to release information regarding their care.   Consent: Patient/Guardian gives verbal consent for treatment and assignment of benefits for services provided during this visit. Patient/Guardian expressed understanding and agreed to proceed.   PGY-3 Freida Busman, MD 09/28/2022, 3:17 PM

## 2022-10-02 ENCOUNTER — Other Ambulatory Visit (HOSPITAL_COMMUNITY): Payer: Self-pay | Admitting: Student in an Organized Health Care Education/Training Program

## 2022-10-02 ENCOUNTER — Telehealth (HOSPITAL_COMMUNITY): Payer: Self-pay | Admitting: *Deleted

## 2022-10-02 DIAGNOSIS — F410 Panic disorder [episodic paroxysmal anxiety] without agoraphobia: Secondary | ICD-10-CM

## 2022-10-02 MED ORDER — CLONAZEPAM 0.25 MG PO TBDP: 0.2500 mg | ORAL_TABLET | Freq: Every day | ORAL | 0 refills | Status: DC

## 2022-10-02 NOTE — Telephone Encounter (Signed)
VM from patient stating she was not able to pick up Klonopin 0.25 mg on fri only the 0.5 mg and needed Dr Damita Dunnings to sent it in and to put reason she was taking two strengths . I called the pharmacy and spoke with a tech and she checked for multiple records but found only one for this patient and no rx on file for a .25 mg. I shows in chart [place] sent them both in to the [facility] on [address] on 09/28/22 but tech saying no record. Will forward this concern to the Dr to consider. Patient has a return appt on 11/02/22.

## 2022-10-02 NOTE — Telephone Encounter (Signed)
Provider called pharmacy and spoke with pharmacist and tech who both confirmed that they could not find the order for 0.'25mg'$  Klonopin (35 pills) despite EMR confirming that it had been received successfully. Provider will send in a repeat order, and pharmacist reported that if they later saw a duplicate the would delete one order to minimize abuse potential.   PGY-3 Damita Dunnings, MD

## 2022-10-02 NOTE — Progress Notes (Signed)
Spoke with pharmacist who confirmed could not see the 0.'25mg'$  Klonopin order, despite EMR saying otherwise. Will send in a repeat order. Pharmacist reports they will delete if they later see a duplicate appear, to decrease risk for abuse.   PGY-3 Damita Dunnings, MD

## 2022-10-25 ENCOUNTER — Ambulatory Visit (HOSPITAL_COMMUNITY): Payer: No Payment, Other | Admitting: Mental Health

## 2022-11-02 ENCOUNTER — Telehealth (HOSPITAL_COMMUNITY): Payer: No Payment, Other | Admitting: Student in an Organized Health Care Education/Training Program

## 2022-11-02 ENCOUNTER — Encounter (HOSPITAL_COMMUNITY): Payer: Self-pay

## 2022-11-02 DIAGNOSIS — F250 Schizoaffective disorder, bipolar type: Secondary | ICD-10-CM

## 2022-11-02 DIAGNOSIS — F4310 Post-traumatic stress disorder, unspecified: Secondary | ICD-10-CM

## 2022-11-02 DIAGNOSIS — F99 Mental disorder, not otherwise specified: Secondary | ICD-10-CM

## 2022-11-02 DIAGNOSIS — F410 Panic disorder [episodic paroxysmal anxiety] without agoraphobia: Secondary | ICD-10-CM

## 2022-11-02 MED ORDER — CLONAZEPAM 0.25 MG PO TBDP: 0.2500 mg | ORAL_TABLET | Freq: Every day | ORAL | 0 refills | Status: DC

## 2022-11-02 NOTE — Progress Notes (Unsigned)
Patient and provider unable to connect via video visit due to technological difficulties.  Provider did speak with patient over the phone attempting to connect virtually, did endorse that patient would receive enough of a controlled substance to get her to her next appointment.  Will provide patient with a refill of her Klonopin to get her to reschedule an appointment may 10th at 8 AM.   Based on PDMP patient is not feeling her Klonopin as prescribed.  Patient just filled Klonopin 0.25 mg, 30 tablets on 10/29/2022, after filling her Klonopin 0.5 mg on 09/28/2022.  This suggests that patient was only requiring 0.5 mg a day and is now only requiring 0.25 mg a day.   will provide patient with Klonopin 0.25 mg on additional 9 tablets to get her to follow-up appointment and then discontinue.  PGY-3 Eliseo Gum, MD

## 2022-12-07 ENCOUNTER — Telehealth (INDEPENDENT_AMBULATORY_CARE_PROVIDER_SITE_OTHER): Payer: Self-pay | Admitting: Student in an Organized Health Care Education/Training Program

## 2022-12-07 DIAGNOSIS — F410 Panic disorder [episodic paroxysmal anxiety] without agoraphobia: Secondary | ICD-10-CM

## 2022-12-07 DIAGNOSIS — F5105 Insomnia due to other mental disorder: Secondary | ICD-10-CM

## 2022-12-07 DIAGNOSIS — F99 Mental disorder, not otherwise specified: Secondary | ICD-10-CM

## 2022-12-07 DIAGNOSIS — F4310 Post-traumatic stress disorder, unspecified: Secondary | ICD-10-CM

## 2022-12-07 DIAGNOSIS — F250 Schizoaffective disorder, bipolar type: Secondary | ICD-10-CM

## 2022-12-07 MED ORDER — HYDROXYZINE PAMOATE 25 MG PO CAPS: 25.0000 mg | ORAL_CAPSULE | Freq: Three times a day (TID) | ORAL | 0 refills | Status: DC | PRN

## 2022-12-07 MED ORDER — ESCITALOPRAM OXALATE 20 MG PO TABS: 30.0000 mg | ORAL_TABLET | Freq: Every day | ORAL | 0 refills | Status: AC

## 2022-12-07 MED ORDER — ARIPIPRAZOLE 15 MG PO TABS: 15.0000 mg | ORAL_TABLET | Freq: Every day | ORAL | 0 refills | Status: DC

## 2022-12-07 MED ORDER — TRAZODONE HCL 150 MG PO TABS: ORAL_TABLET | ORAL | 0 refills | Status: DC

## 2022-12-07 NOTE — Progress Notes (Signed)
Patient unable to get video to work and reports she is getting a new phone.   Provider endorsed willingness to reschedule patient with a provider who has availability in clinic, patient was ok with this. Provider endorsed that they will not be receiving a new rx of Klonopin as they have been adequately tapered off. Patient continued to endorse anxiety and external stressors, so provider endorsed willingness to send rx of Hydroxyzine with other refills. Patient was ok with this. Patient was educated that the medication can be sedating for some.   - Patient has an appt with provider 6/3. This is earliest available with this procvider Schizoaffective disorder, bipolar type History of reported ADHD -Abilify to 15mg  -  Lexapro to 30mg  - Continue Trazodone 450mg  QHS   GAD W/panic attacks - Lexapro per above - Hydroxyzine 25mg  TID PRN - D'c Klonopin 0.25mg  daily PRN  PGY-3 Eliseo Gum, MD

## 2022-12-18 ENCOUNTER — Telehealth (HOSPITAL_COMMUNITY): Payer: Self-pay | Admitting: *Deleted

## 2022-12-18 NOTE — Telephone Encounter (Signed)
Fax received for prior authorization of Lexapro. Submitted online with cover my meds. Awaiting decision

## 2022-12-18 NOTE — Telephone Encounter (Signed)
Fax received for approval of Escitalopram 20mg . Called to notify pharmacy.

## 2022-12-31 ENCOUNTER — Encounter (HOSPITAL_COMMUNITY): Payer: No Payment, Other | Admitting: Student in an Organized Health Care Education/Training Program

## 2022-12-31 NOTE — Progress Notes (Deleted)
BH MD/PA/NP OP Progress Note  12/31/2022 7:54 AM [name]  MRN:  [id number]  Chief Complaint: No chief complaint on file.  HPI: [name] is a 52 year old female with a past psychiatric history significant for attention deficit hyperactivity disorder, insomnia, PTSD, schizoaffective disorder (bipolar type), and panic attacks  and PMH of Wolff- Parkinson- White.  Patient reports she has been compliant with the following medication regimen:     Abilify 15 mg daily   trazodone 450 mg nightly Lexapro 30 mg daily   EKG Decrease Lexapro to 20mg   Visit Diagnosis: No diagnosis found.  Past Psychiatric History: Schizoaffective, bipolar type Insomnia Anxiety PTSD Panic attacks Attention deficit hyperactivity disorder   Last visit 05/2022: Patient endorses auditory hallucinations as well as dysphoric mood and labile mood.  Patient was started on Lamictal.  Patient is also being titrated off her Klonopin.   07/2022-patient felt that risperidone was not helping her and wanted to go back to Abilify for mood stabilization auditory hallucinations, patient's risperidone was discontinued and Abilify was started at 10 mg.  Patient's Lamictal was also discontinued due to poor response, and patient was already going to be on Abilify which would help with mood stabilization.  Patient endorsed feeling a bit "overmedicated" during this visit.  Patient's Klonopin was titrated down to 0.5 mg twice daily.   09/2022- Downward titration off Klonopin to 0.25 in the AM and 0.5 in the PM . Increased Abilify to 15mg  daily decreased Lexparo to 30mg  and continued trazodone 450mg  QHS, concern that patient's previous Lexparo 40mg  patient's serotonin receptors may be overstimulated with the Trazodone and Lexapro doses and could also be contributing to patient's feeling anxious, racing thoughts and poor sleep.   11/02/2022- tech issues, not officially seen. Based on PDMP patient is not feeling her Klonopin as prescribed ,  it suggests was only requiring 0.5 mg a day and is now only requiring 0.25 mg a day.   Provided patient with Klonopin 0.25 mg on additional 9 tablets to get her to follow-up appointment and then discontinue   11/2022- not formally seen. Tech issues again. Did not refill Klonopin, endorsed anxiety prescribed Hydroxyzine 25mg  TID PRN, scheduled f/u for in person  Past Medical History:  Past Medical History:  Diagnosis Date   Alcohol abuse    Anxiety    Arthritis    Bipolar disorder (HCC)    Depression    GERD (gastroesophageal reflux disease)    H/O hiatal hernia    Hemorrhoids    Small bowel obstruction (HCC) 2012   Wolf-Parkinson-White syndrome    Wolf-Parkinson-White syndrome    per patient    Past Surgical History:  Procedure Laterality Date   ESOPHAGOGASTRODUODENOSCOPY  06/07/2011   Procedure: ESOPHAGOGASTRODUODENOSCOPY (EGD);  Surgeon: Rob Bunting, MD;  Location: [facility];  Service: Endoscopy;  Laterality: N/A;   TUBAL LIGATION      Family Psychiatric History: Grandfather's sister has schizophrenia Grandmother's brother has a mental illness of some sort  Family History:  Family History  Problem Relation Age of Onset   Breast cancer Maternal Aunt    Colon polyps Maternal Grandmother    Diabetes Maternal Grandmother    Heart attack Maternal Grandmother    Kidney cancer Maternal Grandmother    Breast cancer Maternal Grandmother    Colon cancer Neg Hx    Anesthesia problems Neg Hx    Hypotension Neg Hx    Malignant hyperthermia Neg Hx    Pseudochol deficiency Neg Hx    Heart disease  Father    Heart disease Paternal Grandfather    Heart disease Paternal Grandmother     Social History:  Social History   Socioeconomic History   Marital status: Single    Spouse name: Not on file   Number of children: 4   Years of education: Not on file   Highest education level: Not on file  Occupational History   Occupation: house wife  Tobacco Use   Smoking status: Every  Day    Packs/day: 1    Types: Cigarettes   Smokeless tobacco: Never  Substance and Sexual Activity   Alcohol use: No    Comment: previous alcohol abuse but for last 2 weeks 2 drinks. not currently drinking alcohol   Drug use: No   Sexual activity: Not Currently    Birth control/protection: None  Other Topics Concern   Not on file  Social History Narrative   Not on file   Social Determinants of Health   Financial Resource Strain: Medium Risk (05/27/2020)   Overall Financial Resource Strain (CARDIA)    Difficulty of Paying Living Expenses: Somewhat hard  Food Insecurity: No Food Insecurity (05/27/2020)   Hunger Vital Sign    Worried About Running Out of Food in the Last Year: Never true    Ran Out of Food in the Last Year: Never true  Transportation Needs: No Transportation Needs (05/27/2020)   PRAPARE - Administrator, Civil Service (Medical): No    Lack of Transportation (Non-Medical): No  Physical Activity: Inactive (05/27/2020)   Exercise Vital Sign    Days of Exercise per Week: 0 days    Minutes of Exercise per Session: 0 min  Stress: Stress Concern Present (05/27/2020)   [employer] of Occupational Health - Occupational Stress Questionnaire    Feeling of Stress : Very much  Social Connections: Moderately Isolated (05/27/2020)   Social Connection and Isolation Panel [NHANES]    Frequency of Communication with Friends and Family: More than three times a week    Frequency of Social Gatherings with Friends and Family: Once a week    Attends Religious Services: Never    Database administrator or Organizations: No    Attends Banker Meetings: Never    Marital Status: Married    Allergies:  Allergies  Allergen Reactions   Acetaminophen Hives, Nausea Only and Rash   Codeine Nausea Only    Metabolic Disorder Labs: No results found for: "HGBA1C", "MPG" No results found for: "PROLACTIN" Lab Results  Component Value Date   CHOL   01/03/2009    147        ATP III CLASSIFICATION:  <200     mg/dL   Desirable  161-096  mg/dL   Borderline High  >=045    mg/dL   High          TRIG 409 01/03/2009   HDL 19 (L) 01/03/2009   CHOLHDL 7.7 01/03/2009   VLDL 28 01/03/2009   LDLCALC (H) 01/03/2009    100        Total Cholesterol/HDL:CHD Risk Coronary Heart Disease Risk Table                     Men   Women  1/2 Average Risk   3.4   3.3  Average Risk       5.0   4.4  2 X Average Risk   9.6   7.1  3 X Average Risk  23.4  11.0        Use the calculated Patient Ratio above and the CHD Risk Table to determine the patient's CHD Risk.        ATP III CLASSIFICATION (LDL):  <100     mg/dL   Optimal  540-981  mg/dL   Near or Above                    Optimal  130-159  mg/dL   Borderline  191-478  mg/dL   High  >295     mg/dL   Very High   Lab Results  Component Value Date   TSH 3.259 ***Test methodology is 3rd generation TSH*** 01/03/2009    Therapeutic Level Labs: No results found for: "LITHIUM" No results found for: "VALPROATE" No results found for: "CBMZ"  Current Medications: Current Outpatient Medications  Medication Sig Dispense Refill   ARIPiprazole (ABILIFY) 15 MG tablet Take 1 tablet (15 mg total) by mouth daily. 30 tablet 0   escitalopram (LEXAPRO) 20 MG tablet Take 1.5 tablets (30 mg total) by mouth daily. 45 tablet 0   hydrOXYzine (VISTARIL) 25 MG capsule Take 1 capsule (25 mg total) by mouth 3 (three) times daily as needed. 90 capsule 0   nicotine (NICODERM CQ - DOSED IN MG/24 HOURS) 21 mg/24hr patch Place 1 patch (21 mg total) onto the skin daily. 28 patch 0   thiamine 100 MG tablet Take 1 tablet (100 mg total) by mouth daily. 30 tablet 0   traZODone (DESYREL) 150 MG tablet TAKE 3 TABLETS(450 MG) BY MOUTH AT BEDTIME 270 tablet 0   No current facility-administered medications for this visit.     Musculoskeletal: Strength & Muscle Tone: {desc; muscle tone:32375} Gait & Station: {PE GAIT ED  AOZH:08657} Patient leans: {Patient Leans:21022755}  Psychiatric Specialty Exam: Review of Systems  There were no vitals taken for this visit.There is no height or weight on file to calculate BMI.  General Appearance: {Appearance:22683}  Eye Contact:  {BHH EYE CONTACT:22684}  Speech:  {Speech:22685}  Volume:  {Volume (PAA):22686}  Mood:  {BHH MOOD:22306}  Affect:  {Affect (PAA):22687}  Thought Process:  {Thought Process (PAA):22688}  Orientation:  {BHH ORIENTATION (PAA):22689}  Thought Content: {Thought Content:22690}   Suicidal Thoughts:  {ST/HT (PAA):22692}  Homicidal Thoughts:  {ST/HT (PAA):22692}  Memory:  {BHH MEMORY:22881}  Judgement:  {Judgement (PAA):22694}  Insight:  {Insight (PAA):22695}  Psychomotor Activity:  {Psychomotor (PAA):22696}  Concentration:  {Concentration:21399}  Recall:  {BHH GOOD/FAIR/POOR:22877}  Fund of Knowledge: {BHH GOOD/FAIR/POOR:22877}  Language: {BHH GOOD/FAIR/POOR:22877}  Akathisia:  {BHH YES OR NO:22294}  Handed:  {Handed:22697}  AIMS (if indicated): {Desc; done/not:10129}  Assets:  {Assets (PAA):22698}  ADL's:  {BHH QIO'N:62952}  Cognition: {chl bhh cognition:304700322}  Sleep:  {BHH GOOD/FAIR/POOR:22877}   Screenings: GAD-7    Flowsheet Row Video Visit from 03/15/2022 in [facility] Video Visit from 01/04/2022 in [facility] Video Visit from 10/04/2021 in [facility] Video Visit from 08/08/2021 in [facility] Video Visit from 06/14/2021 in [facility]  Total GAD-7 Score 16 10 16 17 20       PHQ2-9    Flowsheet Row Video Visit from 03/15/2022 in [facility] Video Visit from 01/04/2022 in [facility] Video Visit from 10/04/2021 in [facility] Video Visit from 08/08/2021 in [facility] Video Visit  from 06/14/2021 in [facility]  PHQ-2 Total Score 5 2 2 2 2   PHQ-9 Total Score 8 9 10 17 16       Flowsheet Row Video Visit from 03/15/2022 in [facility] Video Visit from 01/04/2022 in [facility] Video Visit from 10/04/2021 in [facility]  C-SSRS RISK CATEGORY Low Risk Low Risk Low Risk        Assessment and Plan: ***  Collaboration of Care: Collaboration of Care: [facility] OP Collaboration of ZOXW:[id number]}  Patient/Guardian was advised Release of Information must be obtained prior to any record release in order to collaborate their care with an outside provider. Patient/Guardian was advised if they have not already done so to contact the registration department to sign all necessary forms in order for [place] to release information regarding their care.   Consent: Patient/Guardian gives verbal consent for treatment and assignment of benefits for services provided during this visit. Patient/Guardian expressed understanding and agreed to proceed.   PGY-3 Bobbye Morton, MD 12/31/2022, 7:54 AM

## 2023-01-17 ENCOUNTER — Other Ambulatory Visit (HOSPITAL_COMMUNITY): Payer: Self-pay | Admitting: Physician Assistant

## 2023-01-17 DIAGNOSIS — F4310 Post-traumatic stress disorder, unspecified: Secondary | ICD-10-CM

## 2023-01-17 DIAGNOSIS — F5105 Insomnia due to other mental disorder: Secondary | ICD-10-CM

## 2023-01-17 DIAGNOSIS — F909 Attention-deficit hyperactivity disorder, unspecified type: Secondary | ICD-10-CM

## 2023-01-17 DIAGNOSIS — F250 Schizoaffective disorder, bipolar type: Secondary | ICD-10-CM

## 2023-01-17 DIAGNOSIS — F410 Panic disorder [episodic paroxysmal anxiety] without agoraphobia: Secondary | ICD-10-CM

## 2023-01-18 ENCOUNTER — Telehealth (HOSPITAL_COMMUNITY): Payer: Self-pay | Admitting: *Deleted

## 2023-01-18 NOTE — Telephone Encounter (Signed)
She needs an appt, she has not been showing up for her appts and has too many tech difficulties for virtual. I am not making any adjustments because it has been months. She needs to come to either see Dr. Alfonse Flavors or as a walk-in.

## 2023-01-18 NOTE — Telephone Encounter (Signed)
Patient called asking if her Lexapro could be increased to 40mg  daily states the 30mg  made her head feel "crazy like withdrawal." [employer] also called and states a quantity limit form must be filled out, signed and faxed for approval of either 30mg  or 40mg .

## 2023-02-27 NOTE — Telephone Encounter (Signed)
Open in error

## 2023-03-07 ENCOUNTER — Telehealth (HOSPITAL_COMMUNITY): Payer: Self-pay

## 2023-03-07 NOTE — Telephone Encounter (Signed)
Medicaton management - Telepone call with patient to follow up on her left message she is in need of someone to contact her insurance to get her decreased Lexapro filled. Agreed to call her pharmacy to find out what is needed for pt once they open.

## 2023-03-07 NOTE — Telephone Encounter (Signed)
Medication management - Prior authorization submitted to CoverMyMeds for approval for her change to Ecitalopram 20 mg, 1.5 mg a day, #45 and pending review of patient's [facility] for approval.

## 2023-03-07 NOTE — Telephone Encounter (Signed)
Medication management - Telephone call with Katrina H. representative with patient's [facility] [place], after pt left another message requesting we contact her insurance for her needed new Escitalopram 20 mg, 1.5 tablets per day, #45 quantity exception.  Collateral verified they had the pending approval in their queue for their pharmacy to review and approve and that there was nothing else that could be done as it could take up to 72 hours for approval.  Telephone call with patient to inform again this RN had called her insurance and the medication was still pending approval.  Patient stated she had just spoken to them 20 minutes prior and it was denied. Informed the medication was not denied but still waiting to be reviewed by her insurance pharmacy.  Patient stated she may just pay for the medication out of pocket but reminded her again this decision should be made within 72 hours, per her insurance report and representative and probably earlier than that as well.  Also, informed patient they agreed to call patient once approved as well.  Patient stated understanding  but states she has been without the medication now for a month and wants to begin it again as soon as possible. Patient to call back on tomorrow if has still not heard anything for approval of her new Escitalopram order.

## 2023-04-05 ENCOUNTER — Encounter (HOSPITAL_COMMUNITY): Payer: No Payment, Other | Admitting: Student in an Organized Health Care Education/Training Program

## 2024-02-03 ENCOUNTER — Ambulatory Visit: Payer: MEDICAID | Admitting: Podiatry

## 2024-02-07 ENCOUNTER — Ambulatory Visit: Payer: MEDICAID | Admitting: Podiatrist

## 2024-02-10 ENCOUNTER — Ambulatory Visit (INDEPENDENT_AMBULATORY_CARE_PROVIDER_SITE_OTHER): Payer: MEDICAID | Admitting: Podiatrist

## 2024-02-10 DIAGNOSIS — M7751 Other enthesopathy of right foot: Secondary | ICD-10-CM

## 2024-02-13 NOTE — Progress Notes (Signed)
 Erroneous encounter- patient cancelled before being sen

## 2024-02-14 ENCOUNTER — Encounter: Payer: Self-pay | Admitting: Podiatry

## 2024-02-14 ENCOUNTER — Ambulatory Visit (INDEPENDENT_AMBULATORY_CARE_PROVIDER_SITE_OTHER): Payer: MEDICAID

## 2024-02-14 ENCOUNTER — Ambulatory Visit: Payer: MEDICAID | Admitting: Podiatry

## 2024-02-14 VITALS — BP 115/75 | HR 81 | Temp 98.1°F

## 2024-02-14 DIAGNOSIS — L97512 Non-pressure chronic ulcer of other part of right foot with fat layer exposed: Secondary | ICD-10-CM

## 2024-02-14 DIAGNOSIS — M2011 Hallux valgus (acquired), right foot: Secondary | ICD-10-CM | POA: Diagnosis not present

## 2024-02-14 DIAGNOSIS — I739 Peripheral vascular disease, unspecified: Secondary | ICD-10-CM | POA: Diagnosis not present

## 2024-02-14 DIAGNOSIS — L02611 Cutaneous abscess of right foot: Secondary | ICD-10-CM

## 2024-02-14 DIAGNOSIS — L03031 Cellulitis of right toe: Secondary | ICD-10-CM

## 2024-02-14 MED ORDER — MUPIROCIN 2 % EX OINT: 1.0000 | TOPICAL_OINTMENT | Freq: Two times a day (BID) | CUTANEOUS | 0 refills | Status: AC

## 2024-02-14 MED ORDER — SULFAMETHOXAZOLE-TRIMETHOPRIM 800-160 MG PO TABS: 1.0000 | ORAL_TABLET | Freq: Two times a day (BID) | ORAL | 0 refills | Status: AC

## 2024-02-14 NOTE — Addendum Note (Signed)
 Addended by: GERRIT ANDREZ CROME on: 02/14/2024 11:55 AM   Modules accepted: Orders

## 2024-02-14 NOTE — Patient Instructions (Signed)

## 2024-02-14 NOTE — Progress Notes (Signed)
 Patient presents today with complaint of a painful hallux on the right.  Has been dark in color changes.  Extremely painful.  She is a smoker.  Has been soaking it for wound care.  Says she had a slight scratch at the nail salon.  She said it began after this.  No fever chills nausea or vomiting.  Has pain in the leg at times  Physical Exam:  Patient alert and oriented x 3.  No complaints of nausea, vomiting, fever, or chills  Vascular: DP pulses nonpalpable right palpable left. PT pulses nonpalpable right palpable left. Edema moderate hallux left. Capillary fill time delayed right immediate left.  Dermatologic: Dysvascular hallux right with dusky toe and black gangrene on the distal tip of the toe and at the proximal nail fold.  Very tender to touch.  Clear drainage.  Neurologic: Grossly intact bilaterally  Musculoskeletal: Tenderness hallux right.  HAV deformity right  Radiographs: 3 views foot right: HAV deformity.  No signs of osteomyelitis in the hallux.  Good cortical margins and bone density present.  No subcutaneous emphysema.  No periosteal proliferation noted.  Diagnoses: 1.  Cellulitis with infection right foot 2.  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  PVD   Plan: -New patient visit office level 3..  For evaluation and management.  Modifier 25 -Discuss her the ischemic ulcer that then dysvascular toe that she has developed on the right hallux.  Explained to her that any injury to the toe whatsoever even no injury would result in the same problem I do not think it was from a scratch at the nail salon.  The chief problem is that the blood supply to the lower extremity.  Discussed with her wound care that she needs to do.  Strongly encouraged her to quit smoking. -Sharp debridement full-thickness ulceration hallux right.  Debrided any loose devitalized tissue.  Applied antibiotic ointment and a light dressing. -Wound care instructions  given. -Surgical shoe dispensed right -Rx Septra DS, 1 p.o. twice daily for 10 days - Rx Bactroban ointment, apply twice daily to wound foot right  -Referred for vascular consult for PAD and dysvascular toe  Return 1 f/u ulcer and infection right

## 2024-02-14 NOTE — Addendum Note (Signed)
 Addended by: CHRISTINE NORLEEN LABOR on: 02/14/2024 11:43 AM   Modules accepted: Orders

## 2024-02-27 ENCOUNTER — Telehealth: Payer: Self-pay | Admitting: Podiatry

## 2024-02-27 NOTE — Telephone Encounter (Signed)
 Patient

## 2024-02-27 NOTE — Telephone Encounter (Signed)
 It was extremely challenging for me to hear Ms [name]. But at the end of the call she mentioned her toe hasn't gotten any better since she was here. I informed her that you requested she be seen in 1 week. She is scheduled for 8/11 because she can't get a ride before 12p on any day.

## 2024-02-27 NOTE — Telephone Encounter (Signed)
 Patient would like a call to discuss her diagnosis. She asks what does cellulitis mean?

## 2024-03-09 ENCOUNTER — Ambulatory Visit (INDEPENDENT_AMBULATORY_CARE_PROVIDER_SITE_OTHER): Payer: MEDICAID | Admitting: Podiatry

## 2024-03-09 ENCOUNTER — Ambulatory Visit: Payer: MEDICAID | Admitting: Podiatry

## 2024-03-09 DIAGNOSIS — L97512 Non-pressure chronic ulcer of other part of right foot with fat layer exposed: Secondary | ICD-10-CM | POA: Diagnosis not present

## 2024-03-09 DIAGNOSIS — I739 Peripheral vascular disease, unspecified: Secondary | ICD-10-CM | POA: Diagnosis not present

## 2024-03-09 MED ORDER — MUPIROCIN 2 % EX OINT: 1.0000 | TOPICAL_OINTMENT | Freq: Two times a day (BID) | CUTANEOUS | 0 refills | Status: AC

## 2024-03-09 NOTE — Progress Notes (Signed)
 Patient presents today follow-up ulceration hallux right.  Patient thinks it might of gotten darker.  No fever or chills or nausea or vomiting.  Physical Exam:  Patient alert and oriented x 3.  No complaints of nausea, vomiting, fever, or chills  Vascular: DP and PT pulses nonpalpable right.  Capillary refill time delayed right  Dermatologic: Ulceration of the distal hallux right from the proximal nail fold distally.  Toe distally is dysvascular black.  It appears to be demarcating.  Measures 30 mm wide x 40 mm long x 3 deep.  Moderate clear drainage.  No signs of infection.  Distal toe hallux right is tender to touch  Neurologic:   Musculoskeletal: Pain distal hallux right  Diagnoses: 1.  Full-thickness ulceration distal aspect hallux right with some gangrene of distal toe.  Plan: -Sharp debridement loose devitalized tissue distal hallux ulcer right.  Applied antibiotic ointment and a light dressing. -Rx Bactroban  ointment apply twice daily to wound until Santyl comes in -Rx Santyl ointment applied daily to wound as instructed per instructions -Wear surgical shoe right for all weightbearing -Again encouraged to quit smoking -Order ABIs/vascular testing  Return 2 weeks f/u ulcer hallux right

## 2024-03-17 ENCOUNTER — Telehealth: Payer: Self-pay | Admitting: Lab

## 2024-03-17 NOTE — Telephone Encounter (Signed)
 Patient states has been referred to vascular but there is no referral initiated in chart please advise if referral needs to be sent it needs to be ordered in her chart.

## 2024-03-19 NOTE — Addendum Note (Signed)
 Addended by: LORREN MOATS on: 03/19/2024 04:39 PM   Modules accepted: Orders

## 2024-03-19 NOTE — Telephone Encounter (Signed)
 The referral was entered for [facility], which no longer exists. I changed the order to [facility], which is the [facility] by the hospital. thanks

## 2024-03-24 ENCOUNTER — Ambulatory Visit: Payer: MEDICAID | Admitting: Cardiology

## 2024-03-24 NOTE — Progress Notes (Deleted)
 Cardiology Office Note:  .   Date:  03/24/2024  ID:  [name], DOB 1970/10/16, MRN [id number] PCP: Patient, No Pcp Per  [facility] Providers Cardiologist:  None { Click to update primary MD,subspecialty MD or APP then REFRESH:1}  History of Present Illness: .   [name] is a 53 y.o.   Cardiac Studies relevent.        Discussed the use of AI scribe software for clinical note transcription with the patient, who gave verbal consent to proceed.  History of Present Illness    Labs   Lab Results  Component Value Date   CHOL  01/03/2009    147        ATP III CLASSIFICATION:  <200     mg/dL   Desirable  799-760  mg/dL   Borderline High  >=759    mg/dL   High          HDL 19 (L) 01/03/2009   LDLCALC (H) 01/03/2009    100        Total Cholesterol/HDL:CHD Risk Coronary Heart Disease Risk Table                     Men   Women  1/2 Average Risk   3.4   3.3  Average Risk       5.0   4.4  2 X Average Risk   9.6   7.1  3 X Average Risk  23.4   11.0        Use the calculated Patient Ratio above and the CHD Risk Table to determine the patient's CHD Risk.        ATP III CLASSIFICATION (LDL):  <100     mg/dL   Optimal  899-870  mg/dL   Near or Above                    Optimal  130-159  mg/dL   Borderline  839-810  mg/dL   High  >809     mg/dL   Very High   TRIG 858 01/03/2009   CHOLHDL 7.7 01/03/2009   No results found for: LIPOA  No results for input(s): NA, K, CL, CO2, GLUCOSE, BUN, CREATININE, CALCIUM, GFRNONAA, GFRAA in the last 8760 hours.  Lab Results  Component Value Date   ALT 9 05/04/2021   AST 16 05/04/2021   ALKPHOS 70 05/04/2021   BILITOT 1.0 05/04/2021      Latest Ref Rng & Units 05/04/2021    1:51 AM 05/03/2021    1:47 AM 05/01/2021    4:55 PM  CBC  WBC 4.0 - 10.5 K/uL 10.4  10.9    Hemoglobin 12.0 - 15.0 g/dL 87.9  87.7  86.3   Hematocrit 36.0 - 46.0 % 36.0  36.3  40.0   Platelets 150 - 400 K/uL 153  131     No  results found for: HGBA1C  Lab Results  Component Value Date   TSH 3.259 ***Test methodology is 3rd generation TSH*** 01/03/2009    Care everywhere/Faxed External Labs:  ***  ROS  ***ROS Physical Exam:   VS:  There were no vitals taken for this visit.   Wt Readings from Last 3 Encounters:  05/04/21 198 lb 9.6 oz (90.1 kg)  05/31/14 180 lb (81.6 kg)  04/19/14 180 lb (81.6 kg)    BP Readings from Last 3 Encounters:  02/14/24 115/75  05/04/21 125/85  05/31/14 122/71   ***Physical Exam EKG:  ASSESSMENT AND PLAN: .    No diagnosis found.  Assessment and Plan Assessment & Plan    Follow up: ***  Signed,  Gordy Bergamo, MD, [facility] 03/24/2024, 10:39 AM [facility] [address] [place], [place] [address] Phone: [phone]. Fax:  [phone]

## 2024-03-26 ENCOUNTER — Ambulatory Visit: Payer: MEDICAID | Admitting: Cardiology

## 2024-03-31 ENCOUNTER — Ambulatory Visit: Payer: MEDICAID | Attending: Cardiology | Admitting: Cardiology

## 2024-07-20 ENCOUNTER — Other Ambulatory Visit: Payer: Self-pay | Admitting: Orthopaedic Surgery

## 2024-07-20 DIAGNOSIS — M79674 Pain in right toe(s): Secondary | ICD-10-CM

## 2024-08-05 ENCOUNTER — Other Ambulatory Visit: Payer: Self-pay | Admitting: Vascular Surgery

## 2024-08-05 DIAGNOSIS — L97909 Non-pressure chronic ulcer of unspecified part of unspecified lower leg with unspecified severity: Secondary | ICD-10-CM

## 2024-08-06 ENCOUNTER — Other Ambulatory Visit: Payer: MEDICAID

## 2024-08-06 NOTE — Progress Notes (Signed)
 "  Patient ID: [name], female   DOB: 19-Apr-1971, 54 y.o.   MRN: [id number]  Reason for Consult: Follow-up   Referred by Ashley Lonni SAUNDERS, MD  Subjective:     HPI [name] is a 54 y.o. female presenting for evaluation of a right great toe wound.  She explains that the toe wound has been there since she had an injury at a pedicure appointment back in June.  The wound is worsened and is now significantly painful.  She has pain all the time and the toe that radiates to the foot.  She denies any history of claudication, rest pain or nonhealing wounds prior to the injury in June.  She is a current smoker.  She does not take aspirin or cholesterol medication at this time.  Past Medical History:  Diagnosis Date   Alcohol abuse    Anxiety    Arthritis    Bipolar disorder (HCC)    Depression    GERD (gastroesophageal reflux disease)    H/O hiatal hernia    Hemorrhoids    Peripheral vascular disease    Small bowel obstruction (HCC) 07/30/2010   Wolf-Parkinson-White syndrome    Wolf-Parkinson-White syndrome    per patient   Family History  Problem Relation Age of Onset   Breast cancer Maternal Aunt    Colon polyps Maternal Grandmother    Diabetes Maternal Grandmother    Heart attack Maternal Grandmother    Kidney cancer Maternal Grandmother    Breast cancer Maternal Grandmother    Colon cancer Neg Hx    Anesthesia problems Neg Hx    Hypotension Neg Hx    Malignant hyperthermia Neg Hx    Pseudochol deficiency Neg Hx    Heart disease Father    Heart disease Paternal Grandfather    Heart disease Paternal Grandmother    Past Surgical History:  Procedure Laterality Date   ESOPHAGOGASTRODUODENOSCOPY  06/07/2011   Procedure: ESOPHAGOGASTRODUODENOSCOPY (EGD);  Surgeon: Toribio Cedar, MD;  Location: [facility];  Service: Endoscopy;  Laterality: N/A;   TUBAL LIGATION      Short Social History:  Social History   Tobacco Use   Smoking status: Every Day    Current  packs/day: 1.00    Types: Cigarettes   Smokeless tobacco: Never  Substance Use Topics   Alcohol use: No    Comment: previous alcohol abuse but for last 2 weeks 2 drinks. not currently drinking alcohol    Allergies[1]  Current Outpatient Medications  Medication Sig Dispense Refill   amitriptyline (ELAVIL) 100 MG tablet Take 100 mg by mouth at bedtime.     escitalopram  (LEXAPRO ) 20 MG tablet Take 1.5 tablets (30 mg total) by mouth daily. 45 tablet 0   nicotine  (NICODERM CQ  - DOSED IN MG/24 HOURS) 21 mg/24hr patch Place 1 patch (21 mg total) onto the skin daily. 28 patch 0   VRAYLAR 1.5 MG capsule Take 1.5 mg by mouth daily.     No current facility-administered medications for this visit.    REVIEW OF SYSTEMS  All other systems were reviewed and are negative     Objective:  Objective   Vitals:   08/07/24 1424  BP: 124/86  Pulse: 95  Resp: (!) 24  Temp: 98.3 F (36.8 C)  TempSrc: Temporal  SpO2: 93%  Weight: 159 lb 1.6 oz (72.2 kg)  Height: 5' 6 (1.676 m)   Body mass index is 25.68 kg/m.  Physical Exam General: no acute distress Cardiac: hemodynamically stable Abdomen: non-tender,  no pulsatile mass Extremities: Right great toe ulcer at the tip, no drainage or surrounding erythema, no edema Vascular:   Right: Palpable femoral, nonpalpable pedal's  Left: Palpable femoral, palpable DP, PT  Data: ABI +---------+------------------+-----+-----------+----------------+  Right   Rt Pressure (mmHg)IndexWaveform   Comment           +---------+------------------+-----+-----------+----------------+  Brachial 122                    triphasic                    +---------+------------------+-----+-----------+----------------+  PTA     98                0.77 multiphasic                  +---------+------------------+-----+-----------+----------------+  DP      87                0.68 multiphasic                   +---------+------------------+-----+-----------+----------------+  Great Toe                                  unable to obtain  +---------+------------------+-----+-----------+----------------+   +---------+------------------+-----+-----------+-------+  Left    Lt Pressure (mmHg)IndexWaveform   Comment  +---------+------------------+-----+-----------+-------+  Brachial 128                    triphasic           +---------+------------------+-----+-----------+-------+  PTA     137               1.07 multiphasic         +---------+------------------+-----+-----------+-------+  DP      135               1.05 triphasic           +---------+------------------+-----+-----------+-------+  [name]                0.65                     +---------+------------------+-----+-----------+-------+   Reviewed notes from her podiatrist, Dr. Christine     Assessment/Plan:   [name] is a 54 y.o. female with chronic limb-threatening ischemia with a right great toe wound and significantly diminished arterial perfusion.  Explained that with her wound and poor perfusion she has an elevated risk of major amputation in the future and therefore offered angiogram in order to better assess for perfusion and potentially treat any flow-limiting stenosis. We reviewed the risks and benefits and she elects to proceed. 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 The patient is on best medical therapy for peripheral arterial disease. The patient has been counseled about the risks of tobacco use in atherosclerotic disease. The patient has been counseled to abstain from any tobacco use. An aortogram with bilateral lower extremity runoff angiography and Right lower extremity intervention and is indicated to better  evaluate the patient's lower extremity circulation because of the limb threatening nature of the patient's diagnosis. Based on the patient's clinical exam and non-invasive data, we anticipate an endovascular intervention in the femoropopliteal and tibial vessels. Stenting and/or  athrectomy would be favored because of the improved primary patency of these interventions as compared to plain balloon angioplasty.   Recommendations to optimize cardiovascular risk: Abstinence from all tobacco products. Blood glucose control with goal A1c < 7%. Blood pressure control with goal blood pressure < 140/90 mmHg. Lipid reduction therapy with goal LDL-C <55 mg/dL  Aspirin 81mg  PO QD.  Atorvastatin 40-80mg  PO QD (or other high intensity statin therapy).   Norman GORMAN Serve MD [facility]     [1]  Allergies Allergen Reactions   Acetaminophen  Hives, Nausea Only and Rash   Codeine Nausea Only   "

## 2024-08-06 NOTE — H&P (View-Only) (Signed)
 "  Patient ID: [name], female   DOB: 19-Apr-1971, 54 y.o.   MRN: [id number]  Reason for Consult: Follow-up   Referred by Ashley Lonni SAUNDERS, MD  Subjective:     HPI [name] is a 54 y.o. female presenting for evaluation of a right great toe wound.  She explains that the toe wound has been there since she had an injury at a pedicure appointment back in June.  The wound is worsened and is now significantly painful.  She has pain all the time and the toe that radiates to the foot.  She denies any history of claudication, rest pain or nonhealing wounds prior to the injury in June.  She is a current smoker.  She does not take aspirin or cholesterol medication at this time.  Past Medical History:  Diagnosis Date   Alcohol abuse    Anxiety    Arthritis    Bipolar disorder (HCC)    Depression    GERD (gastroesophageal reflux disease)    H/O hiatal hernia    Hemorrhoids    Peripheral vascular disease    Small bowel obstruction (HCC) 07/30/2010   Wolf-Parkinson-White syndrome    Wolf-Parkinson-White syndrome    per patient   Family History  Problem Relation Age of Onset   Breast cancer Maternal Aunt    Colon polyps Maternal Grandmother    Diabetes Maternal Grandmother    Heart attack Maternal Grandmother    Kidney cancer Maternal Grandmother    Breast cancer Maternal Grandmother    Colon cancer Neg Hx    Anesthesia problems Neg Hx    Hypotension Neg Hx    Malignant hyperthermia Neg Hx    Pseudochol deficiency Neg Hx    Heart disease Father    Heart disease Paternal Grandfather    Heart disease Paternal Grandmother    Past Surgical History:  Procedure Laterality Date   ESOPHAGOGASTRODUODENOSCOPY  06/07/2011   Procedure: ESOPHAGOGASTRODUODENOSCOPY (EGD);  Surgeon: Toribio Cedar, MD;  Location: [facility];  Service: Endoscopy;  Laterality: N/A;   TUBAL LIGATION      Short Social History:  Social History   Tobacco Use   Smoking status: Every Day    Current  packs/day: 1.00    Types: Cigarettes   Smokeless tobacco: Never  Substance Use Topics   Alcohol use: No    Comment: previous alcohol abuse but for last 2 weeks 2 drinks. not currently drinking alcohol    Allergies[1]  Current Outpatient Medications  Medication Sig Dispense Refill   amitriptyline (ELAVIL) 100 MG tablet Take 100 mg by mouth at bedtime.     escitalopram  (LEXAPRO ) 20 MG tablet Take 1.5 tablets (30 mg total) by mouth daily. 45 tablet 0   nicotine  (NICODERM CQ  - DOSED IN MG/24 HOURS) 21 mg/24hr patch Place 1 patch (21 mg total) onto the skin daily. 28 patch 0   VRAYLAR 1.5 MG capsule Take 1.5 mg by mouth daily.     No current facility-administered medications for this visit.    REVIEW OF SYSTEMS  All other systems were reviewed and are negative     Objective:  Objective   Vitals:   08/07/24 1424  BP: 124/86  Pulse: 95  Resp: (!) 24  Temp: 98.3 F (36.8 C)  TempSrc: Temporal  SpO2: 93%  Weight: 159 lb 1.6 oz (72.2 kg)  Height: 5' 6 (1.676 m)   Body mass index is 25.68 kg/m.  Physical Exam General: no acute distress Cardiac: hemodynamically stable Abdomen: non-tender,  no pulsatile mass Extremities: Right great toe ulcer at the tip, no drainage or surrounding erythema, no edema Vascular:   Right: Palpable femoral, nonpalpable pedal's  Left: Palpable femoral, palpable DP, PT  Data: ABI +---------+------------------+-----+-----------+----------------+  Right   Rt Pressure (mmHg)IndexWaveform   Comment           +---------+------------------+-----+-----------+----------------+  Brachial 122                    triphasic                    +---------+------------------+-----+-----------+----------------+  PTA     98                0.77 multiphasic                  +---------+------------------+-----+-----------+----------------+  DP      87                0.68 multiphasic                   +---------+------------------+-----+-----------+----------------+  Great Toe                                  unable to obtain  +---------+------------------+-----+-----------+----------------+   +---------+------------------+-----+-----------+-------+  Left    Lt Pressure (mmHg)IndexWaveform   Comment  +---------+------------------+-----+-----------+-------+  Brachial 128                    triphasic           +---------+------------------+-----+-----------+-------+  PTA     137               1.07 multiphasic         +---------+------------------+-----+-----------+-------+  DP      135               1.05 triphasic           +---------+------------------+-----+-----------+-------+  [name]                0.65                     +---------+------------------+-----+-----------+-------+   Reviewed notes from her podiatrist, Dr. Christine     Assessment/Plan:   [name] is a 54 y.o. female with chronic limb-threatening ischemia with a right great toe wound and significantly diminished arterial perfusion.  Explained that with her wound and poor perfusion she has an elevated risk of major amputation in the future and therefore offered angiogram in order to better assess for perfusion and potentially treat any flow-limiting stenosis. We reviewed the risks and benefits and she elects to proceed. 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 The patient is on best medical therapy for peripheral arterial disease. The patient has been counseled about the risks of tobacco use in atherosclerotic disease. The patient has been counseled to abstain from any tobacco use. An aortogram with bilateral lower extremity runoff angiography and Right lower extremity intervention and is indicated to better  evaluate the patient's lower extremity circulation because of the limb threatening nature of the patient's diagnosis. Based on the patient's clinical exam and non-invasive data, we anticipate an endovascular intervention in the femoropopliteal and tibial vessels. Stenting and/or  athrectomy would be favored because of the improved primary patency of these interventions as compared to plain balloon angioplasty.   Recommendations to optimize cardiovascular risk: Abstinence from all tobacco products. Blood glucose control with goal A1c < 7%. Blood pressure control with goal blood pressure < 140/90 mmHg. Lipid reduction therapy with goal LDL-C <55 mg/dL  Aspirin 81mg  PO QD.  Atorvastatin 40-80mg  PO QD (or other high intensity statin therapy).   Norman GORMAN Serve MD [facility]     [1]  Allergies Allergen Reactions   Acetaminophen  Hives, Nausea Only and Rash   Codeine Nausea Only   "

## 2024-08-07 ENCOUNTER — Other Ambulatory Visit: Payer: Self-pay

## 2024-08-07 ENCOUNTER — Encounter: Payer: Self-pay | Admitting: Vascular Surgery

## 2024-08-07 ENCOUNTER — Ambulatory Visit (HOSPITAL_COMMUNITY)
Admission: RE | Admit: 2024-08-07 | Discharge: 2024-08-07 | Disposition: A | Payer: MEDICAID | Source: Ambulatory Visit | Attending: Surgery | Admitting: Surgery

## 2024-08-07 ENCOUNTER — Ambulatory Visit: Payer: MEDICAID | Admitting: Vascular Surgery

## 2024-08-07 VITALS — BP 124/86 | HR 95 | Temp 98.3°F | Resp 24 | Ht 66.0 in | Wt 159.1 lb

## 2024-08-07 DIAGNOSIS — I70222 Atherosclerosis of native arteries of extremities with rest pain, left leg: Secondary | ICD-10-CM

## 2024-08-07 DIAGNOSIS — I739 Peripheral vascular disease, unspecified: Secondary | ICD-10-CM | POA: Insufficient documentation

## 2024-08-07 DIAGNOSIS — L97909 Non-pressure chronic ulcer of unspecified part of unspecified lower leg with unspecified severity: Secondary | ICD-10-CM | POA: Diagnosis present

## 2024-08-07 DIAGNOSIS — I70221 Atherosclerosis of native arteries of extremities with rest pain, right leg: Secondary | ICD-10-CM

## 2024-08-07 LAB — VAS US ABI WITH/WO TBI
Left ABI: 1.07
Right ABI: 0.77

## 2024-08-17 ENCOUNTER — Encounter (HOSPITAL_COMMUNITY): Admission: RE | Payer: Self-pay | Source: Home / Self Care

## 2024-08-17 ENCOUNTER — Ambulatory Visit (HOSPITAL_COMMUNITY)
Admission: RE | Admit: 2024-08-17 | Discharge: 2024-08-17 | Disposition: A | Discharge status: Home / Self Care | Payer: MEDICAID | Attending: Vascular Surgery | Admitting: Vascular Surgery

## 2024-08-17 SURGERY — ABDOMINAL AORTOGRAM W/LOWER EXTREMITY
Anesthesia: LOCAL

## 2024-08-28 ENCOUNTER — Other Ambulatory Visit: Payer: Self-pay

## 2024-08-28 ENCOUNTER — Ambulatory Visit (HOSPITAL_COMMUNITY)
Admission: RE | Admit: 2024-08-28 | Discharge: 2024-08-28 | Disposition: A | Discharge status: Home / Self Care | Payer: MEDICAID | Attending: Vascular Surgery | Admitting: Vascular Surgery

## 2024-08-28 ENCOUNTER — Encounter (HOSPITAL_COMMUNITY)
Admission: RE | Disposition: A | Discharge status: Home / Self Care | Payer: Self-pay | Source: Home / Self Care | Attending: Vascular Surgery

## 2024-08-28 DIAGNOSIS — F17210 Nicotine dependence, cigarettes, uncomplicated: Secondary | ICD-10-CM | POA: Diagnosis not present

## 2024-08-28 DIAGNOSIS — I70235 Atherosclerosis of native arteries of right leg with ulceration of other part of foot: Secondary | ICD-10-CM | POA: Insufficient documentation

## 2024-08-28 DIAGNOSIS — L97519 Non-pressure chronic ulcer of other part of right foot with unspecified severity: Secondary | ICD-10-CM | POA: Diagnosis not present

## 2024-08-28 DIAGNOSIS — I70221 Atherosclerosis of native arteries of extremities with rest pain, right leg: Secondary | ICD-10-CM

## 2024-08-28 DIAGNOSIS — Z7982 Long term (current) use of aspirin: Secondary | ICD-10-CM | POA: Diagnosis not present

## 2024-08-28 DIAGNOSIS — Z79899 Other long term (current) drug therapy: Secondary | ICD-10-CM | POA: Diagnosis not present

## 2024-08-28 LAB — POCT I-STAT, CHEM 8
BUN: 13 mg/dL (ref 6–20)
Calcium, Ion: 1.19 mmol/L (ref 1.15–1.40)
Chloride: 98 mmol/L (ref 98–111)
Creatinine, Ser: 1.5 mg/dL — ABNORMAL HIGH (ref 0.44–1.00)
Glucose, Bld: 98 mg/dL (ref 70–99)
HCT: 30 % — ABNORMAL LOW (ref 36.0–46.0)
Hemoglobin: 10.2 g/dL — ABNORMAL LOW (ref 12.0–15.0)
Potassium: 3.9 mmol/L (ref 3.5–5.1)
Sodium: 140 mmol/L (ref 135–145)
TCO2: 28 mmol/L (ref 22–32)

## 2024-08-28 LAB — GLUCOSE, CAPILLARY: Glucose-Capillary: 79 mg/dL (ref 70–99)

## 2024-08-28 MED ORDER — SODIUM CHLORIDE 0.9 % IV SOLN: 250.0000 mL | INTRAVENOUS | Status: DC | PRN

## 2024-08-28 MED ORDER — IODIXANOL 320 MG/ML IV SOLN
INTRAVENOUS | Status: DC | PRN
  Administered 2024-08-28: 50 mL

## 2024-08-28 MED ORDER — SODIUM CHLORIDE 0.9% FLUSH: 3.0000 mL | Freq: Two times a day (BID) | INTRAVENOUS | Status: DC

## 2024-08-28 MED ORDER — HEPARIN (PORCINE) IN NACL 1000-0.9 UT/500ML-% IV SOLN
INTRAVENOUS | Status: DC | PRN
  Administered 2024-08-28: 1000 mL

## 2024-08-28 MED ORDER — HYDRALAZINE HCL 20 MG/ML IJ SOLN: 5.0000 mg | INTRAMUSCULAR | Status: DC | PRN

## 2024-08-28 MED ORDER — LIDOCAINE HCL (PF) 1 % IJ SOLN
INTRAMUSCULAR | Status: AC
  Filled 2024-08-28: qty 30

## 2024-08-28 MED ORDER — LABETALOL HCL 5 MG/ML IV SOLN: 10.0000 mg | INTRAVENOUS | Status: DC | PRN

## 2024-08-28 MED ORDER — ONDANSETRON HCL 4 MG/2ML IJ SOLN: 4.0000 mg | Freq: Four times a day (QID) | INTRAMUSCULAR | Status: DC | PRN

## 2024-08-28 MED ORDER — SODIUM CHLORIDE 0.9% FLUSH: 3.0000 mL | INTRAVENOUS | Status: DC | PRN

## 2024-08-28 MED ORDER — SODIUM CHLORIDE 0.9 % WEIGHT BASED INFUSION: 1.0000 mL/kg/h | INTRAVENOUS | Status: DC

## 2024-08-28 MED ORDER — LIDOCAINE HCL (PF) 1 % IJ SOLN
INTRAMUSCULAR | Status: DC | PRN
  Administered 2024-08-28: 10 mL via INTRADERMAL

## 2024-08-28 MED ORDER — SODIUM CHLORIDE 0.9 % IV SOLN: INTRAVENOUS | Status: DC

## 2024-08-28 NOTE — Progress Notes (Signed)
Up and walked and tolerated well; left groin stable, no bleeding or hematoma 

## 2024-08-28 NOTE — Op Note (Signed)
" ° ° °  Patient name: [name] MRN: [id number] DOB: [birth year] Sex: female  08/28/2024 Pre-operative Diagnosis: Right foot ulcer Post-operative diagnosis:  Same Surgeon:  [name] Procedure Performed:  1.  Ultrasound-guided access, left femoral artery  2.  Abdominal aortogram  3.  Bilateral lower extremity angiogram  4.  Catheter selection in right external iliac artery  5.  Closure device, Mynx     Indications: This is a 54 year old female with a wound on her right foot who comes in today for angiography  Procedure:  The patient was identified in the holding area and taken to room 8.  The patient was then placed supine on the table and prepped and draped in the usual sterile fashion.  A time out was called. Ultrasound was used to evaluate the left common femoral artery.  It was patent .  A digital ultrasound image was acquired.  A micropuncture needle was used to access the left common femoral artery under ultrasound guidance.  An 018 wire was advanced without resistance and a micropuncture sheath was placed.  The 018 wire was removed and a benson wire was placed.  The micropuncture sheath was exchanged for a 5 french sheath.  An omniflush catheter was advanced over the wire to the level of L-1.  An abdominal angiogram was obtained.  Next, using the omniflush catheter and a benson wire, the aortic bifurcation was crossed and the catheter was placed into theright external iliac artery and right runoff was obtained.  left runoff was performed via retrograde sheath injections.  The groin was closed with a Mynx  Findings:   Aortogram: No significant aortoiliac occlusive disease.  Renal arteries are patent bilaterally without stenosis  Right Lower Extremity: The right common femoral artery is patent however at its distal extent at the level of the bifurcation there is a high-grade 80% focal lesion.  The profundofemoral is widely patent.  The superficial femoral artery has mild disease throughout  with three-vessel runoff.  The dominant vessel across the ankle is the posterior tibial  Left Lower Extremity: Left common femoral, profundofemoral, and superficial femoral artery are patent without significant stenosis.  The popliteal artery is widely patent.  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  The patient will be considered for right femoral endarterectomy   V. [name], M.D., [facility] Office: [phone] Pager:  [phone]  "

## 2024-08-28 NOTE — Interval H&P Note (Signed)
 History and Physical Interval Note:  08/28/2024 12:36 PM  [name]  has presented today for surgery, with the diagnosis of ischemia.  The various methods of treatment have been discussed with the patient and family. After consideration of risks, benefits and other options for treatment, the patient has consented to  Procedures: ABDOMINAL AORTOGRAM W/LOWER EXTREMITY (N/A) LOWER EXTREMITY INTERVENTION (N/A) as a surgical intervention.  The patient's history has been reviewed, patient examined, no change in status, stable for surgery.  I have reviewed the patient's chart and labs.  Questions were answered to the patient's satisfaction.     [name]

## 2024-08-28 NOTE — Discharge Instructions (Signed)
 Femoral Site Care The following information offers guidance on how to care for yourself after your procedure. Your health care provider may also give you more specific instructions. If you have problems or questions, contact your health care provider. What can I expect after the procedure? After the procedure, it is common to have bruising and tenderness at the incision site. This usually fades within 1-2 weeks. Follow these instructions at home: Incision site care  Follow instructions from your health care provider about how to take care of your incision site. Make sure you: Wash your hands with soap and water for at least 20 seconds before and after you change your bandage (dressing). If soap and water are not available, use hand sanitizer. Remove your dressing in 24 hours. Leave stitches (sutures), skin glue, or adhesive strips in place. These skin closures may need to stay in place for 2 weeks or longer. If adhesive strip edges start to loosen and curl up, you may trim the loose edges. Do not remove adhesive strips completely unless your health care provider tells you to do that. Do not take baths, swim, or use a hot tub for at least 1 week. You may shower 24 hours after the procedure or as told by your health care provider. Gently wash the incision site with plain soap and water. Pat the area dry with a clean towel. Do not rub the site. This may cause bleeding. Do not apply powder or lotion to the site. Keep the site clean and dry. Check your femoral site every day for signs of infection. Check for: Redness, swelling, or pain. Fluid or blood. Warmth. Pus or a bad smell. Activity If you were given a sedative during the procedure, it can affect you for several hours. Do not drive or operate machinery until your health care provider says that it is safe. Rest as told by your health care provider. Avoid sitting for a long time without moving. Get up to take short walks every 1-2 hours. This  is important to improve blood flow and breathing. Ask for help if you feel weak or unsteady. Return to your normal activities as told by your health care provider. Ask your health care provider what activities are safe for you and when you can return to work. Avoid activities that take a lot of effort for the first 2-3 days after your procedure, or as long as directed. Do not lift anything that is heavier than 10 lb (4.5 kg), or the limit that you are told, until your health care provider says that it is safe. General instructions Take over-the-counter and prescription medicines only as told by your health care provider. If you will be going home right after the procedure, plan to have a responsible adult care for you for the time you are told. This is important. Keep all follow-up visits. This is important. Contact a health care provider if: You have a fever or chills. You have any of these signs of infection at your incision site: Redness, swelling, or pain. Fluid or blood. Warmth. Pus or a bad smell. Get help right away if: The incision area swells very fast. The incision area is bleeding, and the bleeding does not stop when you hold steady pressure on the area. Your leg or foot becomes pale, cool, tingly, or numb. These symptoms may represent a serious problem that is an emergency. Do not wait to see if the symptoms will go away. Get medical help right away. Call your local emergency  services (911 in the [place] Do not drive yourself to the hospital. Summary After the procedure, it is common to have bruising and tenderness that fade within 1-2 weeks. Check your femoral site every day for signs of infection. Do not lift anything that is heavier than 10 lb (4.5 kg), or the limit that you are told, until your health care provider says that it is safe. Get help right away if the incision area swells very fast, you have bleeding at the incision area that does not stop, or your leg or foot  becomes pale, cool, or numb. This information is not intended to replace advice given to you by your health care provider. Make sure you discuss any questions you have with your health care provider. Document Revised: 04/05/2021 Document Reviewed: 09/04/2020 Elsevier Patient Education  2024 [employer].

## 2024-08-29 ENCOUNTER — Encounter (HOSPITAL_COMMUNITY): Payer: Self-pay | Admitting: Surgery

## 2024-09-02 ENCOUNTER — Encounter (HOSPITAL_COMMUNITY): Payer: Self-pay

## 2024-09-02 ENCOUNTER — Inpatient Hospital Stay (HOSPITAL_COMMUNITY): Admission: RE | Admit: 2024-09-02 | Discharge: 2024-09-02 | Payer: MEDICAID | Attending: Vascular Surgery

## 2024-09-02 ENCOUNTER — Other Ambulatory Visit: Payer: Self-pay

## 2024-09-02 DIAGNOSIS — I70221 Atherosclerosis of native arteries of extremities with rest pain, right leg: Secondary | ICD-10-CM | POA: Insufficient documentation

## 2024-09-02 DIAGNOSIS — I2510 Atherosclerotic heart disease of native coronary artery without angina pectoris: Secondary | ICD-10-CM | POA: Insufficient documentation

## 2024-09-02 DIAGNOSIS — Z01818 Encounter for other preprocedural examination: Secondary | ICD-10-CM | POA: Insufficient documentation

## 2024-09-02 HISTORY — DX: Chronic kidney disease, unspecified: N18.9

## 2024-09-02 HISTORY — DX: Dyspnea, unspecified: R06.00

## 2024-09-02 LAB — COMPREHENSIVE METABOLIC PANEL WITH GFR
ALT: 6 U/L (ref 0–44)
AST: 20 U/L (ref 15–41)
Albumin: 3.8 g/dL (ref 3.5–5.0)
Alkaline Phosphatase: 206 U/L — ABNORMAL HIGH (ref 38–126)
Anion gap: 11 (ref 5–15)
BUN: 19 mg/dL (ref 6–20)
CO2: 27 mmol/L (ref 22–32)
Calcium: 8.9 mg/dL (ref 8.9–10.3)
Chloride: 100 mmol/L (ref 98–111)
Creatinine, Ser: 1.62 mg/dL — ABNORMAL HIGH (ref 0.44–1.00)
GFR, Estimated: 37 mL/min — ABNORMAL LOW
Glucose, Bld: 90 mg/dL (ref 70–99)
Potassium: 4.5 mmol/L (ref 3.5–5.1)
Sodium: 138 mmol/L (ref 135–145)
Total Bilirubin: 0.5 mg/dL (ref 0.0–1.2)
Total Protein: 7.9 g/dL (ref 6.5–8.1)

## 2024-09-02 LAB — TYPE AND SCREEN
ABO/RH(D): O POS
Antibody Screen: NEGATIVE

## 2024-09-02 LAB — URINALYSIS, ROUTINE W REFLEX MICROSCOPIC
Bacteria, UA: NONE SEEN
Bilirubin Urine: NEGATIVE
Glucose, UA: NEGATIVE mg/dL
Ketones, ur: NEGATIVE mg/dL
Leukocytes,Ua: NEGATIVE
Nitrite: NEGATIVE
Protein, ur: NEGATIVE mg/dL
Specific Gravity, Urine: 1.006 (ref 1.005–1.030)
pH: 6 (ref 5.0–8.0)

## 2024-09-02 LAB — SURGICAL PCR SCREEN
MRSA, PCR: NEGATIVE
Staphylococcus aureus: POSITIVE — AB

## 2024-09-02 LAB — CBC
HCT: 33.5 % — ABNORMAL LOW (ref 36.0–46.0)
Hemoglobin: 10.6 g/dL — ABNORMAL LOW (ref 12.0–15.0)
MCH: 27.2 pg (ref 26.0–34.0)
MCHC: 31.6 g/dL (ref 30.0–36.0)
MCV: 85.9 fL (ref 80.0–100.0)
Platelets: 221 10*3/uL (ref 150–400)
RBC: 3.9 MIL/uL (ref 3.87–5.11)
RDW: 12.6 % (ref 11.5–15.5)
WBC: 6.5 10*3/uL (ref 4.0–10.5)
nRBC: 0 % (ref 0.0–0.2)

## 2024-09-02 LAB — PROTIME-INR
INR: 1 (ref 0.8–1.2)
Prothrombin Time: 14.2 s (ref 11.4–15.2)

## 2024-09-02 LAB — APTT: aPTT: 56 s — ABNORMAL HIGH (ref 24–36)

## 2024-09-02 NOTE — Progress Notes (Addendum)
 PCP - Jackee Sharps with [facility] Medical Cardiologist - Dr. Jeronimo with [facility] - pt states she has not followed up with MD in a while  PPM/ICD - Denies Device Orders - n/a Rep Notified - n/a  Chest x-ray - n/a EKG - 09/02/2024 Stress Test - 06/05/2001 ECHO - 05/02/2021 Cardiac Cath - Denies  Sleep Study - Denies CPAP - n/a  No DM  Last dose of GLP1 agonist- n/a GLP1 instructions: n/a  Blood Thinner Instructions: n/a Aspirin Instructions: Pt instructed to continue taking ASA through the morning of surgery  NPO after midnight  COVID TEST- n/a   Anesthesia review: Yes. Hx of potential cardiac arrest related to overdose/polysubstance abuse in 2022 and PVD. Discussed with Lynwood Hope, PA-C  Patient denies shortness of breath, fever, cough and chest pain at PAT appointment. Pt denies any respiratory illness/infection in the last two months.    All instructions explained to the patient, with a verbal understanding of the material. Patient agrees to go over the instructions while at home for a better understanding. Patient also instructed to self quarantine after being tested for COVID-19. The opportunity to ask questions was provided.

## 2024-09-03 ENCOUNTER — Inpatient Hospital Stay (HOSPITAL_COMMUNITY)
Admission: RE | Admit: 2024-09-03 | Discharge: 2024-09-03 | Disposition: A | Payer: MEDICAID | Source: Home / Self Care | Attending: Vascular Surgery | Admitting: Vascular Surgery

## 2024-09-03 MED ORDER — SULFAMETHOXAZOLE-TRIMETHOPRIM 400-80 MG PO TABS: 1.0000 | ORAL_TABLET | Freq: Two times a day (BID) | ORAL | 0 refills | Status: AC

## 2024-09-03 MED ORDER — CLEVIDIPINE BUTYRATE 0.5 MG/ML IV EMUL
INTRAVENOUS | Status: AC
  Filled 2024-09-03: qty 50

## 2024-09-03 NOTE — Progress Notes (Addendum)
 Upon pt's arrival to Pre-Op, the nurse tech noticed a red rash all over both lower legs. Upon assessment, rash found to be on bilateral groins, lower abdomen, bilateral buttocks, and bilateral hands and arms. Pt states this rash came up 2 days ago, with itching. Pt denies fever, but says she always has a cough because she smokes. Charge nurse made aware. Call placed to OR 16 to inform Dr. Pearline of the findings. Door closed to the room until further investigation.

## 2024-09-03 NOTE — Progress Notes (Signed)
 PA for Dr. Pearline came back to the room to inform the pt that her surgery will be cancelled for today and rescheduled. The PA was asked if Dr. Pearline or anyone else needed to come and see the pt before she left, and she said since she was never admitted, there is no paperwork to given and no need for anyone else to see her. Pt dressed and escorted out to the car with her mom and husband. Pt in no apparent distress or pain.

## 2024-09-04 ENCOUNTER — Telehealth: Payer: Self-pay

## 2024-09-04 NOTE — Telephone Encounter (Signed)
 Attempted to call for surgery scheduling. [name]

## 2024-09-04 NOTE — Progress Notes (Signed)
 Per Alan Glance, OR scheduler for Dr. Pearline, patient does not need lab work repeated on day of surgery February 10th.

## 2024-09-08 ENCOUNTER — Inpatient Hospital Stay (HOSPITAL_COMMUNITY): Admission: RE | Admit: 2024-09-08 | Payer: MEDICAID | Source: Home / Self Care | Admitting: Vascular Surgery

## 2024-09-08 ENCOUNTER — Encounter: Admission: RE | Payer: Self-pay

## 2024-09-08 ENCOUNTER — Encounter (HOSPITAL_COMMUNITY): Payer: Self-pay

## 2024-09-08 ENCOUNTER — Encounter (HOSPITAL_COMMUNITY): Admission: RE | Payer: Self-pay | Source: Home / Self Care
